# Patient Record
Sex: Female | Born: 1935 | Race: Black or African American | Hispanic: No | Marital: Married | Smoking: Never smoker
Health system: Southern US, Community
[De-identification: ages and names within clinical notes are randomized; demographics above are authoritative.]

## PROBLEM LIST (undated history)

## (undated) DIAGNOSIS — I10 Essential (primary) hypertension: Secondary | ICD-10-CM

## (undated) DIAGNOSIS — K649 Unspecified hemorrhoids: Secondary | ICD-10-CM

## (undated) DIAGNOSIS — E079 Disorder of thyroid, unspecified: Secondary | ICD-10-CM

## (undated) DIAGNOSIS — C801 Malignant (primary) neoplasm, unspecified: Secondary | ICD-10-CM

## (undated) HISTORY — PX: COLON SURGERY: SHX602

## (undated) HISTORY — PX: FRACTURE SURGERY: SHX138

## (undated) HISTORY — DX: Unspecified hemorrhoids: K64.9

---

## 2003-09-15 ENCOUNTER — Other Ambulatory Visit: Payer: Self-pay

## 2003-10-07 ENCOUNTER — Ambulatory Visit: Payer: Self-pay | Admitting: Internal Medicine

## 2003-10-07 ENCOUNTER — Ambulatory Visit: Payer: Self-pay | Admitting: General Surgery

## 2003-10-08 ENCOUNTER — Ambulatory Visit: Payer: Self-pay | Admitting: Internal Medicine

## 2003-11-02 ENCOUNTER — Ambulatory Visit: Payer: Self-pay | Admitting: Internal Medicine

## 2003-12-02 ENCOUNTER — Ambulatory Visit: Payer: Self-pay | Admitting: Internal Medicine

## 2004-01-02 ENCOUNTER — Ambulatory Visit: Payer: Self-pay | Admitting: Internal Medicine

## 2004-02-05 ENCOUNTER — Other Ambulatory Visit: Payer: Self-pay

## 2004-02-05 ENCOUNTER — Inpatient Hospital Stay: Payer: Self-pay | Admitting: Internal Medicine

## 2004-02-06 ENCOUNTER — Other Ambulatory Visit: Payer: Self-pay

## 2004-02-16 ENCOUNTER — Ambulatory Visit: Payer: Self-pay | Admitting: Internal Medicine

## 2004-03-01 ENCOUNTER — Ambulatory Visit: Payer: Self-pay | Admitting: Internal Medicine

## 2004-04-01 ENCOUNTER — Ambulatory Visit: Payer: Self-pay | Admitting: Internal Medicine

## 2004-05-01 ENCOUNTER — Ambulatory Visit: Payer: Self-pay | Admitting: Internal Medicine

## 2004-06-01 ENCOUNTER — Ambulatory Visit: Payer: Self-pay | Admitting: Internal Medicine

## 2004-07-01 ENCOUNTER — Ambulatory Visit: Payer: Self-pay | Admitting: Internal Medicine

## 2004-08-01 ENCOUNTER — Ambulatory Visit: Payer: Self-pay | Admitting: Internal Medicine

## 2004-09-01 ENCOUNTER — Ambulatory Visit: Payer: Self-pay | Admitting: Internal Medicine

## 2004-10-01 ENCOUNTER — Ambulatory Visit: Payer: Self-pay | Admitting: Internal Medicine

## 2004-11-01 ENCOUNTER — Ambulatory Visit: Payer: Self-pay | Admitting: Internal Medicine

## 2004-12-01 ENCOUNTER — Ambulatory Visit: Payer: Self-pay | Admitting: Internal Medicine

## 2005-01-01 ENCOUNTER — Ambulatory Visit: Payer: Self-pay | Admitting: Internal Medicine

## 2005-02-22 ENCOUNTER — Ambulatory Visit: Payer: Self-pay | Admitting: Unknown Physician Specialty

## 2005-02-28 ENCOUNTER — Ambulatory Visit: Payer: Self-pay | Admitting: Internal Medicine

## 2005-03-07 ENCOUNTER — Emergency Department: Payer: Self-pay | Admitting: Emergency Medicine

## 2005-03-28 ENCOUNTER — Ambulatory Visit: Payer: Self-pay | Admitting: Internal Medicine

## 2005-04-01 ENCOUNTER — Ambulatory Visit: Payer: Self-pay | Admitting: Internal Medicine

## 2005-06-26 ENCOUNTER — Ambulatory Visit: Payer: Self-pay | Admitting: Unknown Physician Specialty

## 2005-08-24 ENCOUNTER — Ambulatory Visit: Payer: Self-pay | Admitting: Internal Medicine

## 2005-09-01 ENCOUNTER — Ambulatory Visit: Payer: Self-pay | Admitting: Internal Medicine

## 2005-10-09 ENCOUNTER — Ambulatory Visit: Payer: Self-pay | Admitting: Unknown Physician Specialty

## 2005-10-24 ENCOUNTER — Ambulatory Visit: Payer: Self-pay | Admitting: Internal Medicine

## 2006-01-11 ENCOUNTER — Ambulatory Visit: Payer: Self-pay | Admitting: Internal Medicine

## 2006-02-01 ENCOUNTER — Ambulatory Visit: Payer: Self-pay | Admitting: Internal Medicine

## 2006-07-02 ENCOUNTER — Ambulatory Visit: Payer: Self-pay | Admitting: Internal Medicine

## 2006-07-31 ENCOUNTER — Ambulatory Visit: Payer: Self-pay | Admitting: Internal Medicine

## 2006-08-02 ENCOUNTER — Ambulatory Visit: Payer: Self-pay | Admitting: Internal Medicine

## 2006-09-18 ENCOUNTER — Ambulatory Visit: Payer: Self-pay | Admitting: Unknown Physician Specialty

## 2007-01-13 IMAGING — CR DG CHEST 2V
1 series · 2 of 2 positions shown · non-contrast
Comparison: none

REASON FOR EXAM: colon ca, eval for lung nodule
COMMENTS:

[Series 1: view not recorded · 0.17mm/px · 2 of 2 slices shown]
[im 1/2]
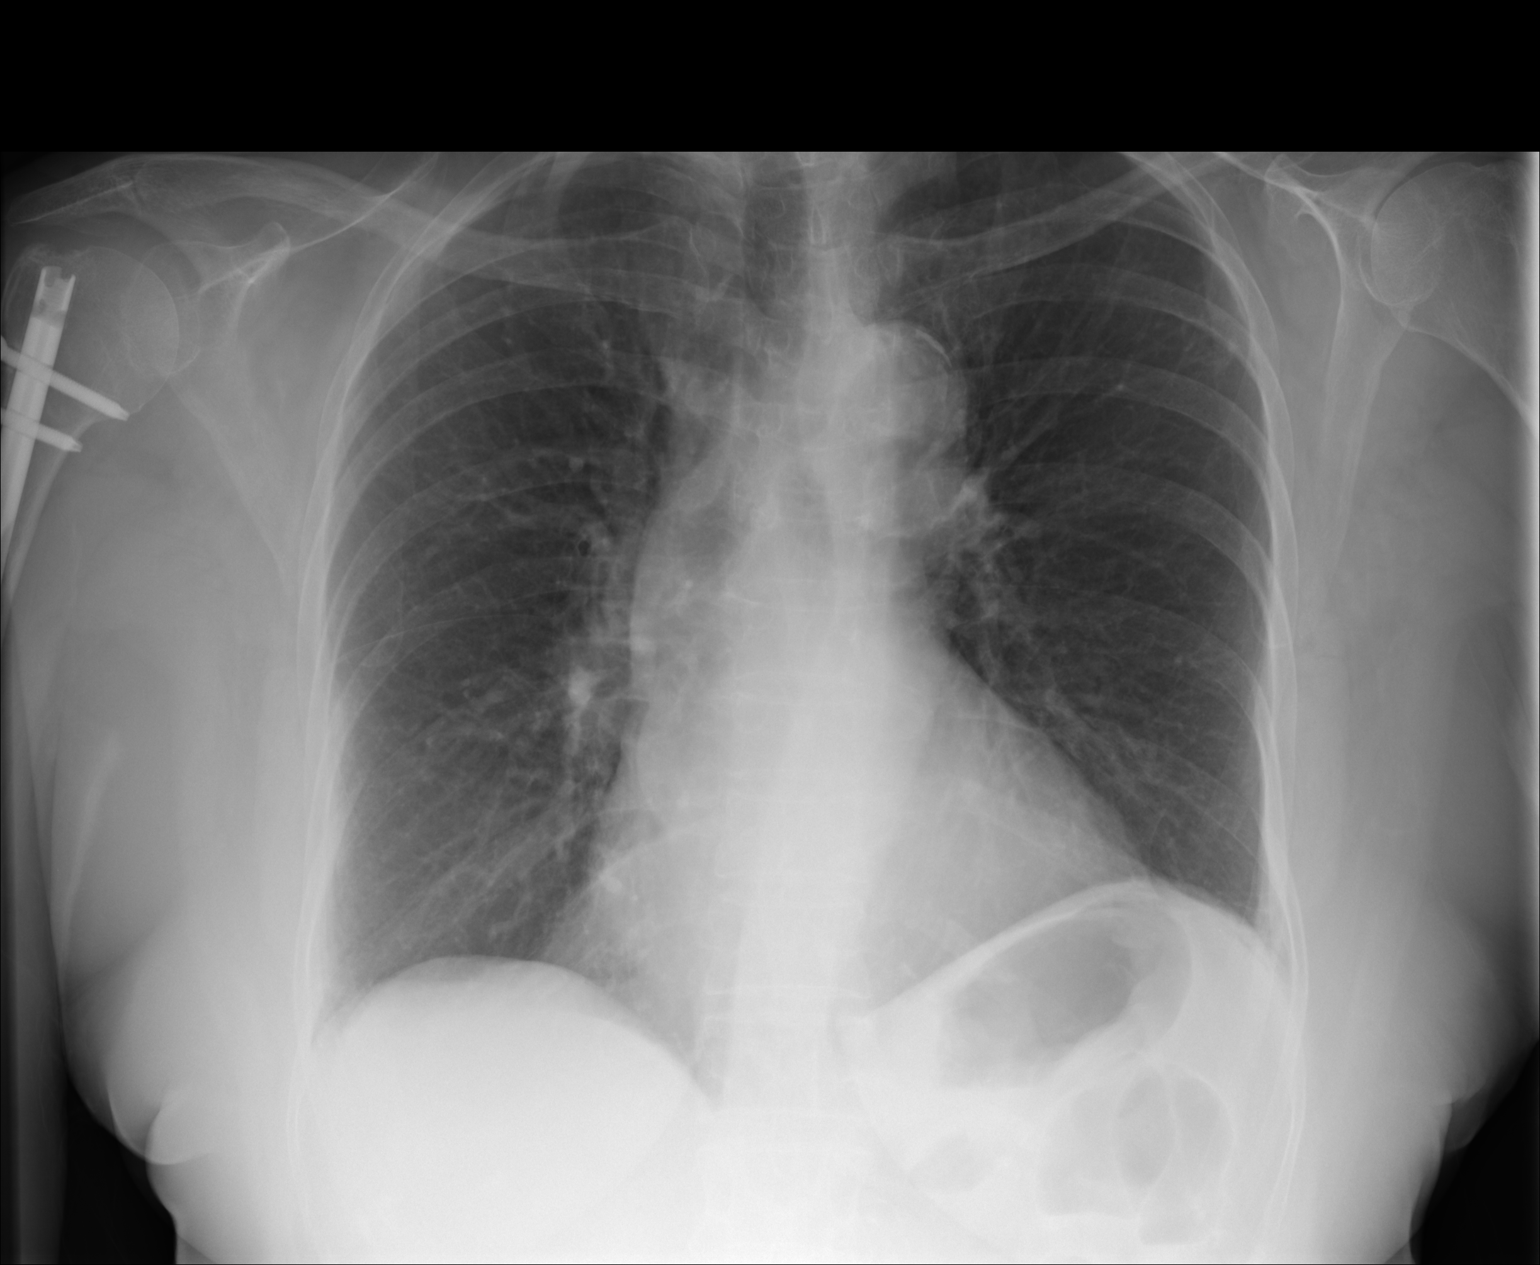
[im 2/2]
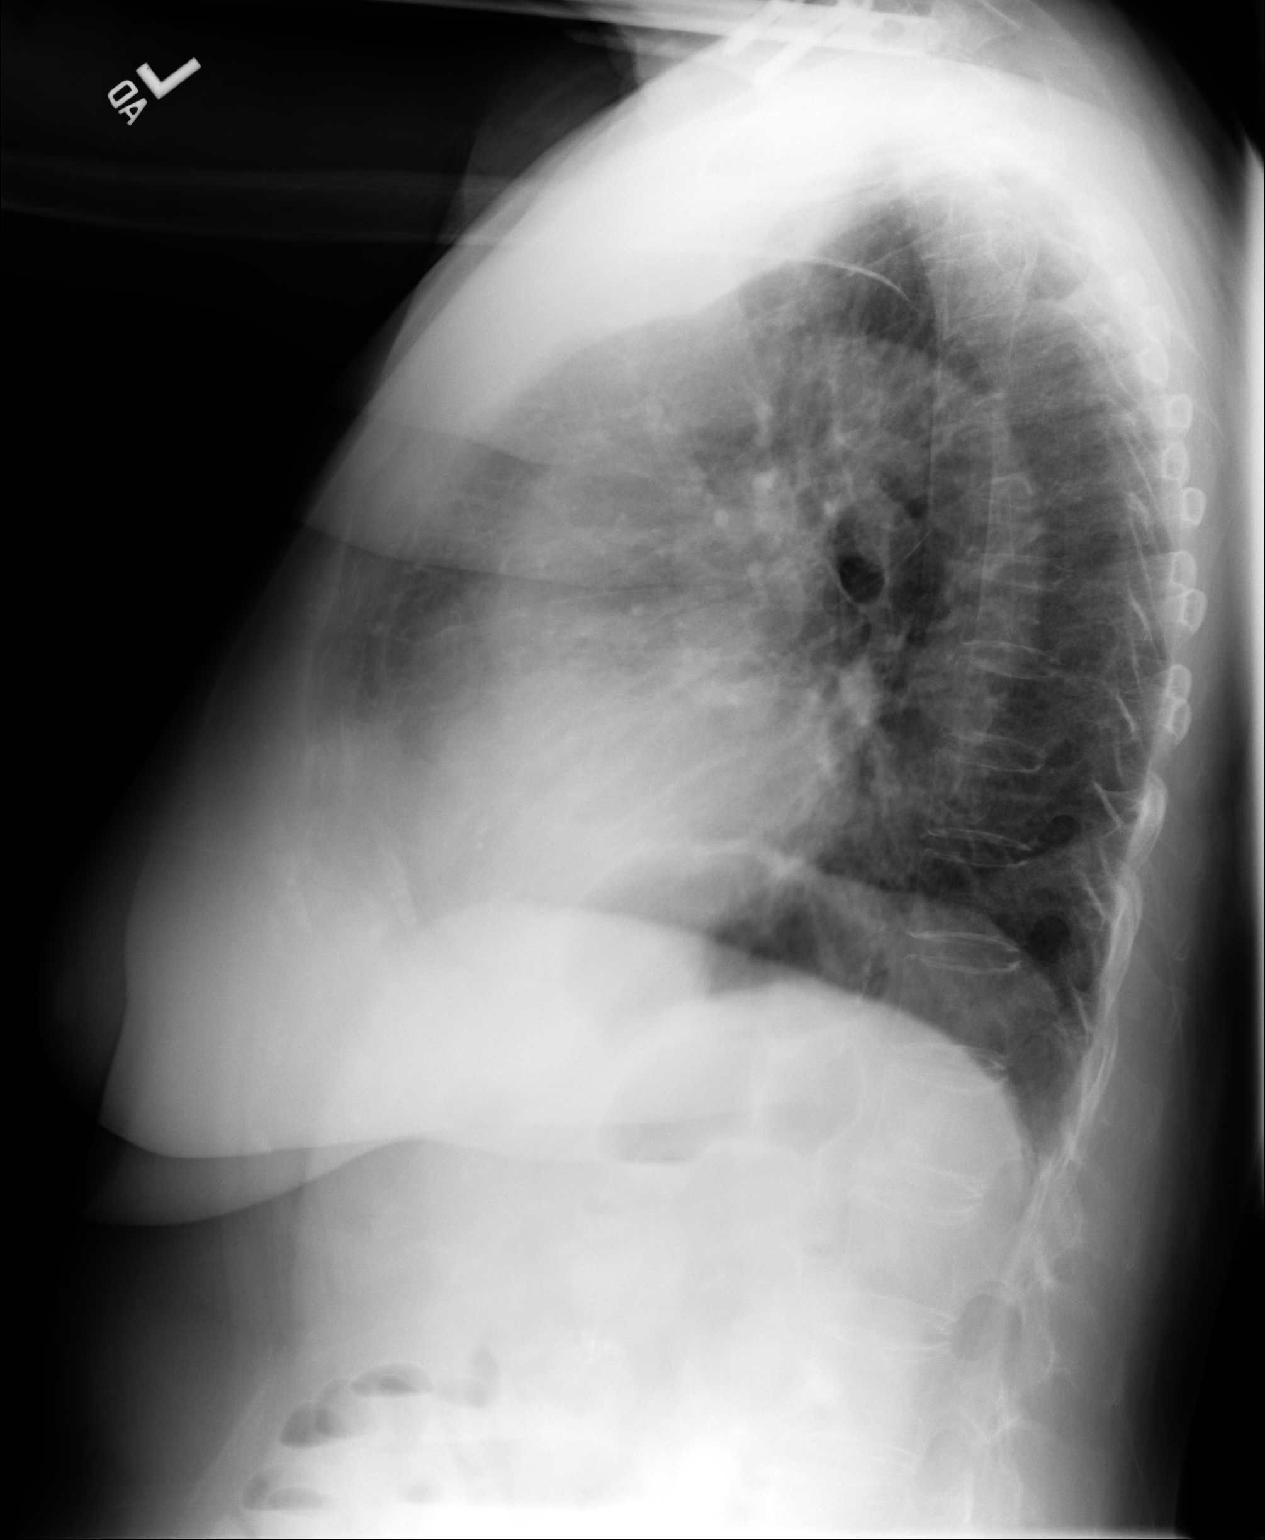

[2 of 2 positions shown; findings below may reference images not displayed]

PROCEDURE:     DXR - DXR CHEST PA (OR AP) AND LATERAL  - October 24, 2005 [DATE]

RESULT:     The lung fields are clear. No pneumonia, pneumothorax or pleural
effusion is seen.  No finding suspicious for metastatic disease is
identified. The previously present Port-A-Cath is no longer seen. The heart
is upper limits to normal in size and is stable as compared to the exam of
12-28-04.  No significant osseous abnormalities are identified.
IMPRESSION: 1)No acute changes are identified.

## 2007-01-13 IMAGING — CT CT ABD-PELV W/ CM
1 of 2 series · 16 of 32 positions shown, 20 images · non-contrast
Comparison: none

REASON FOR EXAM: colon cancer surveillance CT to eval for mets or
recurrence
COMMENTS:

PROCEDURE:     CT  - CT ABDOMEN / PELVIS  W  - October 24, 2005 [DATE]
RESULT:
REASON FOR CONSULTATION: Colon cancer.
TECHNIQUE: Axial images were obtained from the hemidiaphragm to the pubic
symphysis post intravenous and oral administration of contrast material.

[Series 2: abdomen · axial · 0.71mm/px · z∈[-686,-278]mm · 16 of 57 slices shown, 20 images]
[im 3/57  soft-tissue]
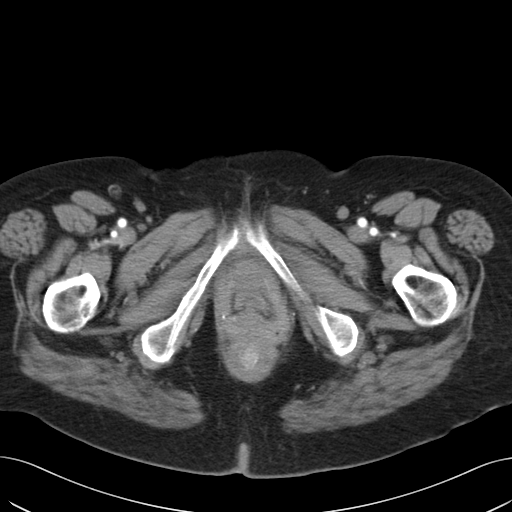
[im 3/57  bone]
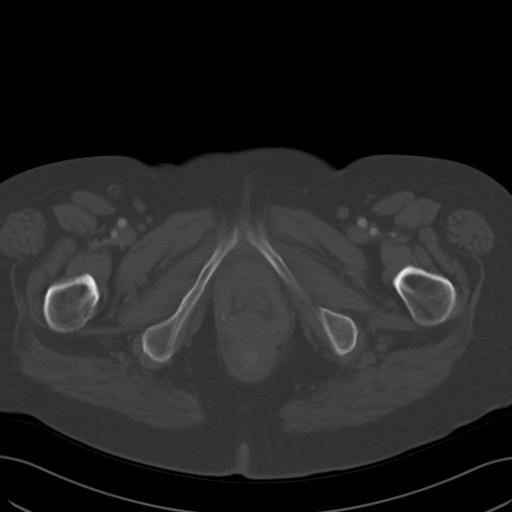
[im 7/57  soft-tissue]
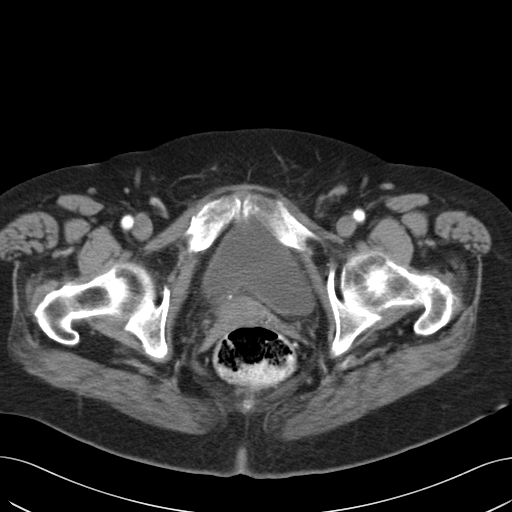
[im 12/57  soft-tissue]
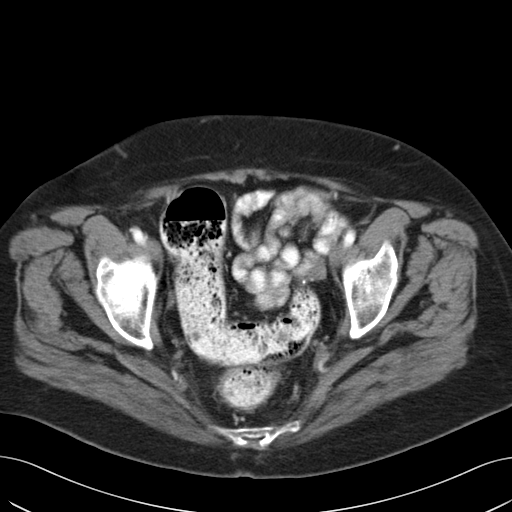
[im 16/57  soft-tissue]
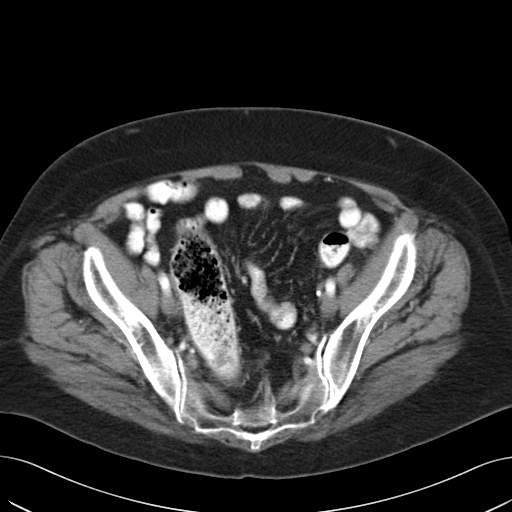
[im 18/57  soft-tissue]
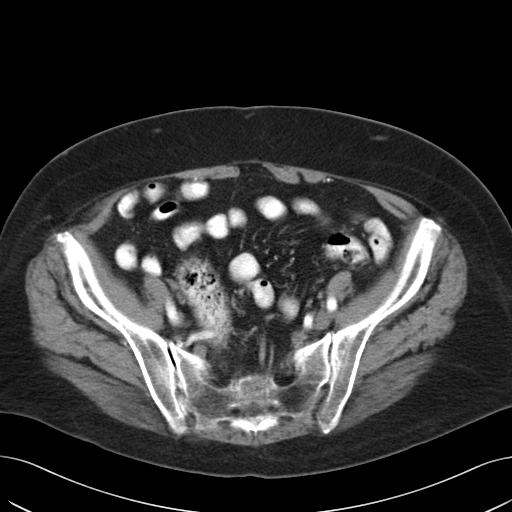
[im 23/57  soft-tissue]
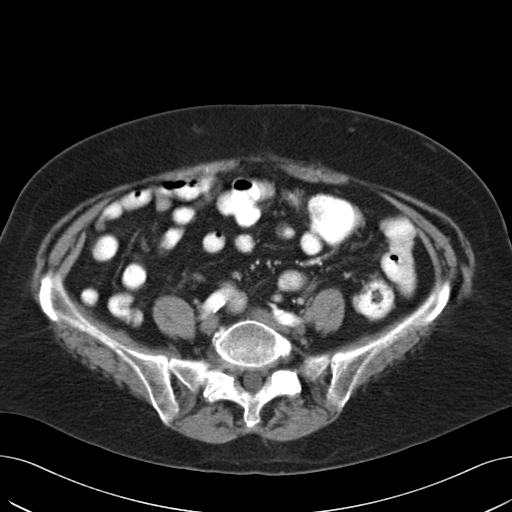
[im 27/57  soft-tissue]
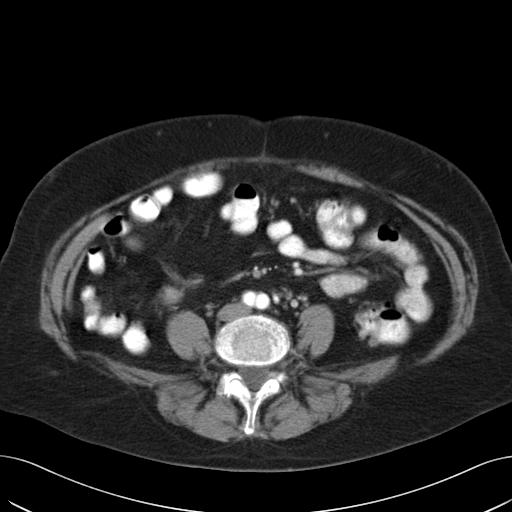
[im 30/57  soft-tissue]
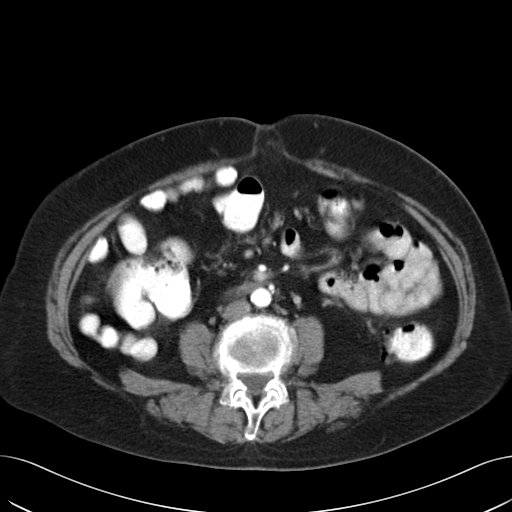
[im 34/57  soft-tissue]
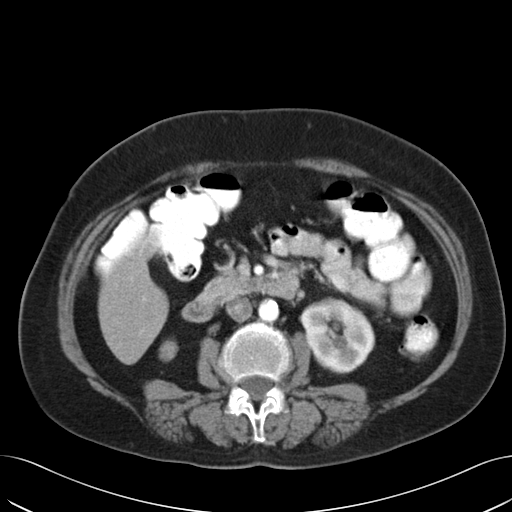
[im 34/57  bone]
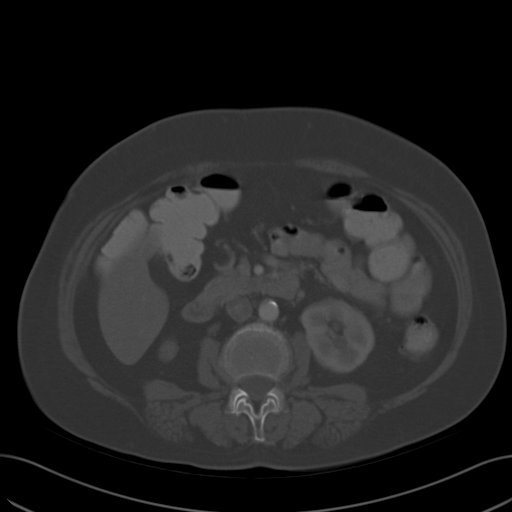
[im 39/57  soft-tissue]
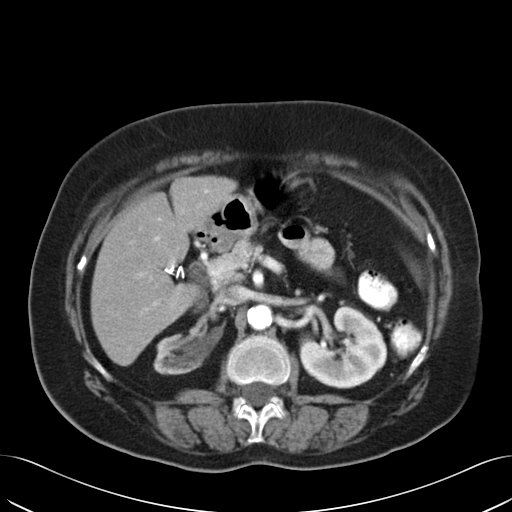
[im 43/57  soft-tissue]
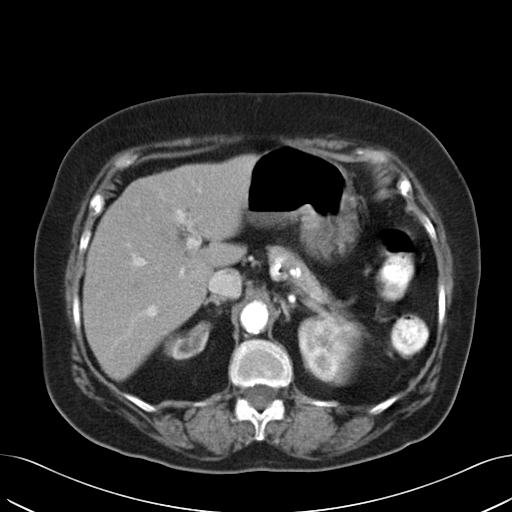
[im 45/57  soft-tissue]
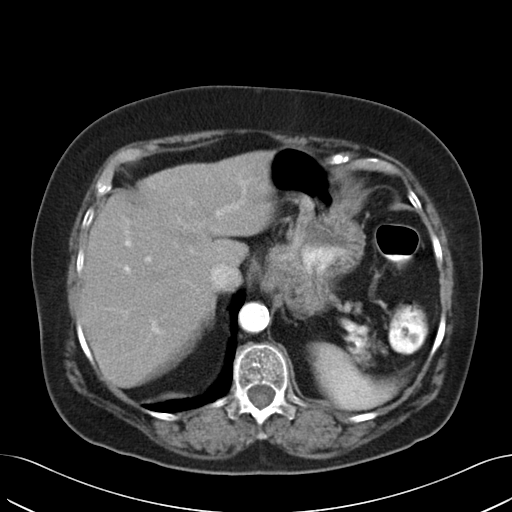
[im 48/57  lung]
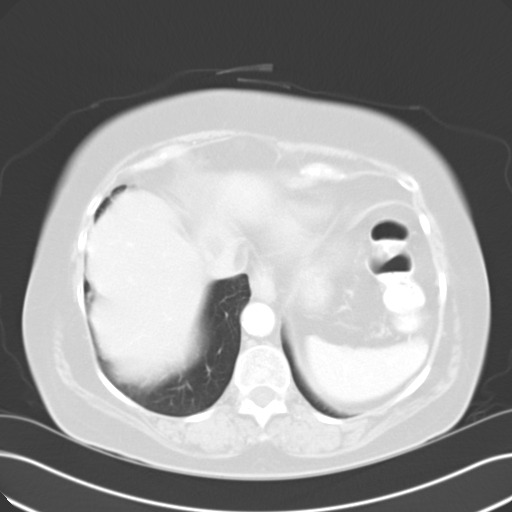
[im 50/57  soft-tissue]
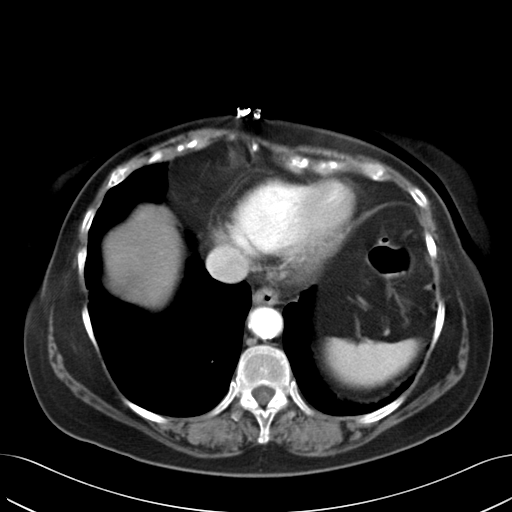
[im 50/57  lung]
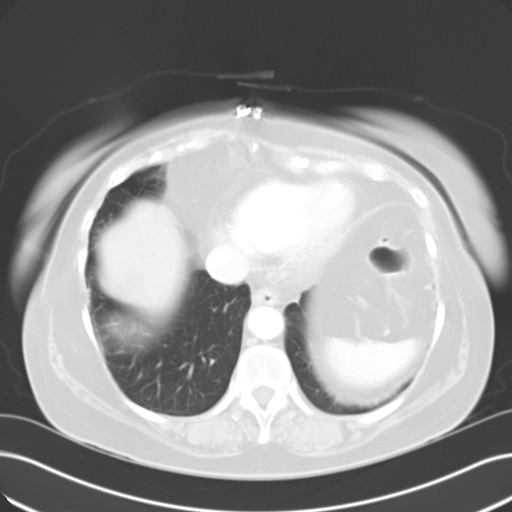
[im 52/57  lung]
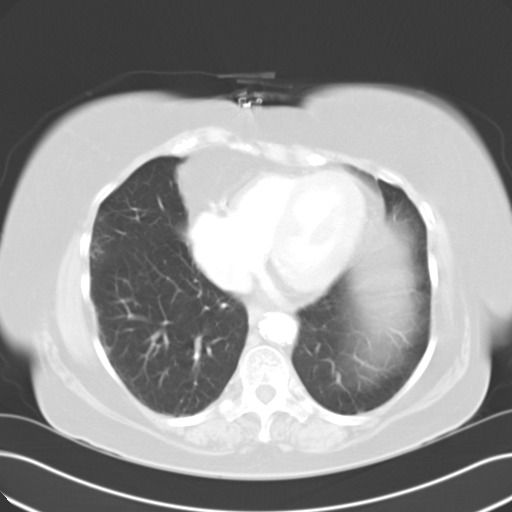
[im 54/57  soft-tissue]
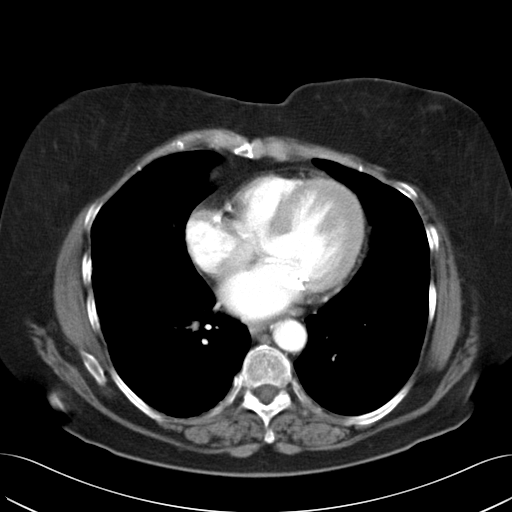
[im 54/57  lung]
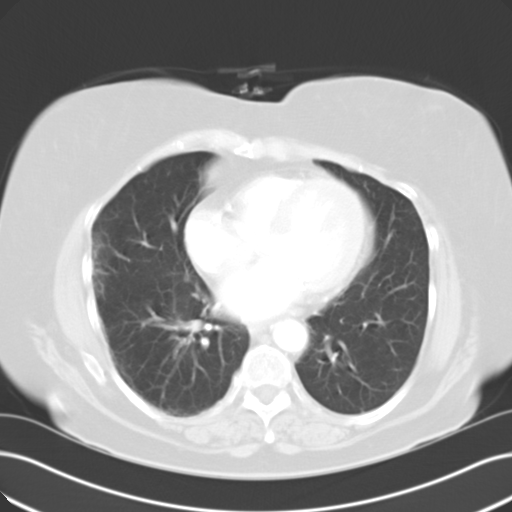

[16 of 32 positions shown; findings below may reference images not displayed]

FINDINGS: The liver and spleen appear intact with no focal masses seen. The
RIGHT kidney is atrophic.  A small exophytic cyst is noted in the LEFT mid
kidney. No hydronephrosis is seen.

No retroperitoneal or pelvic lymphadenopathy is identified. No ascites is
seen in the abdomen or pelvis. The adrenal glands appear intact.
IMPRESSION: 1)No definite evidence of metastases. Incidentally noted is an atrophic
RIGHT kidney.

## 2007-04-25 ENCOUNTER — Emergency Department: Payer: Self-pay | Admitting: Emergency Medicine

## 2007-04-25 ENCOUNTER — Other Ambulatory Visit: Payer: Self-pay

## 2007-09-19 ENCOUNTER — Ambulatory Visit: Payer: Self-pay | Admitting: Unknown Physician Specialty

## 2007-11-11 ENCOUNTER — Ambulatory Visit: Payer: Self-pay | Admitting: Unknown Physician Specialty

## 2009-06-27 ENCOUNTER — Ambulatory Visit: Payer: Self-pay | Admitting: Unknown Physician Specialty

## 2009-06-30 ENCOUNTER — Ambulatory Visit: Payer: Self-pay | Admitting: Unknown Physician Specialty

## 2009-08-18 ENCOUNTER — Inpatient Hospital Stay: Payer: Self-pay | Admitting: Specialist

## 2009-09-08 ENCOUNTER — Ambulatory Visit: Payer: Self-pay | Admitting: Unknown Physician Specialty

## 2009-10-28 ENCOUNTER — Ambulatory Visit: Payer: Self-pay | Admitting: Surgery

## 2010-04-21 ENCOUNTER — Inpatient Hospital Stay: Payer: Self-pay | Admitting: Internal Medicine

## 2010-04-25 ENCOUNTER — Encounter: Payer: Self-pay | Admitting: Internal Medicine

## 2010-05-02 ENCOUNTER — Encounter: Payer: Self-pay | Admitting: Internal Medicine

## 2010-07-12 ENCOUNTER — Ambulatory Visit: Payer: Self-pay | Admitting: Surgery

## 2011-09-25 ENCOUNTER — Ambulatory Visit: Payer: Self-pay | Admitting: Unknown Physician Specialty

## 2012-01-26 LAB — CBC
HCT: 35.6 % (ref 35.0–47.0)
MCH: 29.1 pg (ref 26.0–34.0)
MCHC: 32.7 g/dL (ref 32.0–36.0)
MCV: 89 fL (ref 80–100)
WBC: 8.5 10*3/uL (ref 3.6–11.0)

## 2012-01-26 LAB — COMPREHENSIVE METABOLIC PANEL
Albumin: 3.3 g/dL — ABNORMAL LOW (ref 3.4–5.0)
Alkaline Phosphatase: 84 U/L (ref 50–136)
Calcium, Total: 9.2 mg/dL (ref 8.5–10.1)
Chloride: 106 mmol/L (ref 98–107)
Glucose: 102 mg/dL — ABNORMAL HIGH (ref 65–99)
Potassium: 3.9 mmol/L (ref 3.5–5.1)
SGOT(AST): 18 U/L (ref 15–37)
Sodium: 140 mmol/L (ref 136–145)

## 2012-01-27 ENCOUNTER — Inpatient Hospital Stay: Payer: Self-pay | Admitting: Internal Medicine

## 2012-01-27 LAB — URINALYSIS, COMPLETE
Bilirubin,UR: NEGATIVE
Blood: NEGATIVE
Ketone: NEGATIVE
RBC,UR: 5 /HPF (ref 0–5)
Squamous Epithelial: 2

## 2012-01-27 LAB — PROTIME-INR: INR: 1.9

## 2012-01-27 LAB — TROPONIN I: Troponin-I: 0.04 ng/mL

## 2012-01-28 DIAGNOSIS — I059 Rheumatic mitral valve disease, unspecified: Secondary | ICD-10-CM

## 2012-01-28 LAB — CBC WITH DIFFERENTIAL/PLATELET
Basophil #: 0 10*3/uL (ref 0.0–0.1)
Basophil %: 0.6 %
Eosinophil #: 0.2 10*3/uL (ref 0.0–0.7)
Eosinophil %: 3 %
HCT: 37.2 % (ref 35.0–47.0)
Lymphocyte %: 32.9 %
MCHC: 33 g/dL (ref 32.0–36.0)
Monocyte %: 7.9 %
Neutrophil #: 4.6 10*3/uL (ref 1.4–6.5)
RBC: 4.16 10*6/uL (ref 3.80–5.20)
RDW: 13.8 % (ref 11.5–14.5)
WBC: 8.2 10*3/uL (ref 3.6–11.0)

## 2012-01-28 LAB — BASIC METABOLIC PANEL
Anion Gap: 9 (ref 7–16)
Calcium, Total: 9.3 mg/dL (ref 8.5–10.1)
Chloride: 102 mmol/L (ref 98–107)
Co2: 27 mmol/L (ref 21–32)
Creatinine: 1.28 mg/dL (ref 0.60–1.30)
EGFR (African American): 46 — ABNORMAL LOW
Osmolality: 280 (ref 275–301)
Potassium: 3.8 mmol/L (ref 3.5–5.1)

## 2012-01-28 LAB — PROTIME-INR
INR: 2.5
Prothrombin Time: 26.8 secs — ABNORMAL HIGH (ref 11.5–14.7)

## 2012-01-29 LAB — URINE CULTURE

## 2012-01-29 LAB — PROTIME-INR: Prothrombin Time: 23.8 secs — ABNORMAL HIGH (ref 11.5–14.7)

## 2012-01-30 LAB — PROTIME-INR
INR: 1.9
Prothrombin Time: 22.5 secs — ABNORMAL HIGH (ref 11.5–14.7)

## 2013-01-16 ENCOUNTER — Emergency Department: Payer: Self-pay | Admitting: Emergency Medicine

## 2014-03-01 DIAGNOSIS — I10 Essential (primary) hypertension: Secondary | ICD-10-CM | POA: Insufficient documentation

## 2014-03-01 DIAGNOSIS — E782 Mixed hyperlipidemia: Secondary | ICD-10-CM | POA: Insufficient documentation

## 2014-03-01 DIAGNOSIS — E039 Hypothyroidism, unspecified: Secondary | ICD-10-CM | POA: Insufficient documentation

## 2014-04-23 NOTE — H&P (Signed)
PATIENT NAME:  Haley Brooks, SIMINGTON MR#:  [id number] DATE OF BIRTH:  [birth year]  DATE OF ADMISSION:  01/27/2012  REFERRING PHYSICIAN: Lurline Hare, MD.   PRIMARY CARE PHYSICIAN: Iva Boop, MD.   CHIEF COMPLAINT: Confusion and altered mental status.   HISTORY OF PRESENT ILLNESS: This is a 81 year old female with significant past medical history of hypertension, history of MI in the past, hypothyroidism, COPD, bilateral PE on anticoagulation with INR of 1.9, who presents with complaints of confusion, trouble with memory, poor coordination, having swimmy head as she described it and headache. The patient is with her daughter at bedside. The patient said these complaints started this afternoon. Upon presentation, the patient denies any other neurological deficits. Denies any focal deficits. Any tingling or numbness, or slurred speech or dysarthria. The patient had a CT of the brain done, which did not show any acute finding, but it did show chronic lacunar infarct, which is new from previous CAT scan done in August 2011. The patient was hypertensive in the Emergency Department with initial blood pressure of [id number] systolic. At one point, it went to [id number] systolic blood pressure. The patient had previous admission in 2011 with similar presentation, where she was diagnosed with hypertensive encephalopathy, where her symptoms have resolved after her blood pressure has been controlled. The patient does not recall what her blood pressure is at baseline, but her daughter states she does seem to have poor compliance with diet where her diet does not have any salt restrictions in it as well by reviewing the patient's previous medical records, she appears to be having poor followup with her primary care as well. The patient is being admitted for malignant hypertension with suspected hypertensive encephalopathy.   PAST MEDICAL HISTORY:  1.  History of coronary artery disease.  2.  Hypertension.  3.  Hypothyroidism.  4.   Allergic rhinitis.  5.  Osteoporosis.  6.  Degenerative joint disease.  7.  COPD.  8.  Colon cancer, status post chemotherapy.  9.  History of kidney stones.  10. Bilateral PE on warfarin.  11. Chronic anemia.   ALLERGIES: No known drug allergies.   PAST SURGICAL HISTORY:  1.  Cholecystectomy.  2.  Partial thyroidectomy.  3.  Lithotripsy with stent and right kidney in 2003.  4.  Right knee surgery.  5.  Right humerus fracture with right hip fracture in 2002.  6.  Colectomy in 2005 for colon cancer.  7.  Bilateral cataract surgery.   HOME MEDICATIONS:  1.  Fosamax 70 mg weekly.  2.  Levothyroxine 125 mcg oral daily.  3.  Metoprolol extended release 25 mg oral daily.  4.  Norvasc 5 mg daily.  5.  Benazepril 20 mg oral daily.  6.  Warfarin, curr acid 40 mg oral daily.  ent dose is unclear.   SOCIAL HISTORY: The patient is a nonsmoker. She lives at home with her husband. No history of drinking.   FAMILY HISTORY: Significant for colon cancer, diabetes, lung cancer, degenerative joint disease and osteoporosis.   REVIEW OF SYSTEMS:  CONSTITUTIONAL: The patient appears to be mildly confused, but she denies any fever or fatigue. She complains of generalized weakness and fatigue.  EYES: Denies blurry vision, double vision, pain, inflammation or glaucoma.  ENT: Denies tinnitus, ear pain, hearing loss, epistaxis or discharge.  RESPIRATORY: Denies cough, wheezing, hemoptysis, dyspnea or COPD.  CARDIOVASCULAR: Denies chest pain, edema, palpitations or syncope.  GASTROINTESTINAL: Denies nausea, vomiting, diarrhea, abdominal pain, hematemesis, melena, jaundice or rectal  bleed.  GENITOURINARY: Denies dysuria, hematuria. Denies polyuria, polydipsia, heat or cold intolerance.  HEMATOLOGY: Has history of anemia and history of PE on anticoagulation.  INTEGUMENTARY: Denies acne, rash or skin lesions.  MUSCULOSKELETAL: Denies any cramps, limited activity, arthritis, shoulder pain or neck pain.   NEUROLOGIC: Has complaints of headache, feeling of a swimming head, poor cognition and forgetfulness. Denies any history of  CVA, TIA or seizures in the past.  PSYCHIATRIC: Denies anxiety, insomnia, bipolar disorder, depression or schizophrenia.   PHYSICAL EXAMINATION:  VITAL SIGNS: Temperature 98.8, pulse 72, respiratory rate 18, blood pressure 201/96, saturating 96% on room air.  GENERAL: Elderly female looks comfortable in bed in no apparent distress.  HEENT: Head atraumatic, normocephalic. Pupils equal, reactive to light. Pink conjunctivae. Anicteric sclerae. Moist oral mucosa.  NECK: Supple. No thyromegaly. No JVD.  CHEST: Good air entry bilaterally. No wheezing, rales or rhonchi.  CARDIOVASCULAR: S1, S2 heard. No rubs, murmur, or gallops.  ABDOMEN: Soft, nontender, nondistended. Bowel sounds present.  EXTREMITIES: No edema. No clubbing. No cyanosis.  PSYCHIATRIC: The patient is mildly confused, but pleasant, alert x 3, but appears to be sluggish; it took her a while to remember the date.  NEUROLOGIC: Cranial nerves grossly intact. No nystagmus. No facial droop. No deficits.  Motor 5 out of 5 in all extremities. Sensation symmetrical. Deep tendon reflexes symmetrical and intact. Cerebellar signs symmetrical and no abnormal cerebellar signs. Good finger-to-nose test.   PERTINENT LABORATORIES: Glucose 102, BUN 19, creatinine 1.21, sodium 140, potassium 3.9, chloride 106, CO2 26. Troponin 0.04. White blood cell 8.5, hemoglobin 11.6, hematocrit 35.6, platelets 191. INR 1.9.  CT brain does not show any acute intracranial hemorrhage, showing lacunar infarct in the right centrum semiovale, which is new since previous CT, but has a chronic appearance.   ASSESSMENT AND PLAN:  1.  Altered mental status with increased confusion and trouble with memory, this is most likely related to hypertensive encephalopathy as the patient had previous admission in 2011 with similar presentation where she was  diagnosed with hypertensive encephalopathy where all of her symptoms resolved after that, so we will aim for better blood pressure control. It is unclear how compliant she is with her medication, but she is certainly noncompliant with her diet as per her daughter, so we will have her on heart healthy diet. Will resume her blood pressure medications and will add her on IV hydralazine as needed, but given the fact the patient had some new finding her CT of the brain when compared to 2011, so we will proceed with ordering an MRI of the brain and bilateral carotid Doppler to rule out any acute ischemic event, even though it seems  unlikely. As well, we will give her 324 mg of aspirin. As well we will obtain urinalysis and TSH as they were not done in the ED as they might contribute to her confusion.  2.  Hypertension, uncontrolled. Malignant hypertension. We will resume the patient's home medication and add p.r.n. hydralazine.  3.  Bilateral pulmonary embolus. We will continue with warfarin. We will target INR 2 to 3.  4.  Hypothyroidism. Continue with Synthroid. Check TSH.  5.  Osteoporosis. Can be resumed on Fosamax once discharged home.  6.  Deep vein thrombosis prophylaxis. The patient is on warfarin for pulmonary embolus.  7.  Gastrointestinal prophylaxis. The patient is on proton pump inhibitor.  8.  CODE STATUS: Full code.   TOTAL TIME SPENT ON ADMISSION AND PATIENT CARE: 60 minutes.  ____________________________ Albertine Patricia, MD dse:aw D: 01/27/2012 03:34:33 ET T: 01/27/2012 08:17:48 ET JOB#: [id number]  cc: Albertine Patricia, MD, <Dictator> Soliana Kitko Graciela Husbands MD ELECTRONICALLY SIGNED 01/28/2012 0:31

## 2014-04-23 NOTE — Discharge Summary (Signed)
PATIENT NAME:  Haley Brooks, Haley Brooks MR#:  [id number] DATE OF BIRTH:  [birth year]  DATE OF ADMISSION:  01/27/2012 DATE OF DISCHARGE:  01/30/2012  PRIMARY CARE PHYSICIAN: Dr. Kem Kays.  The patient is being discharged to [facility] for rehab.   PRESENTING COMPLAINT: Confusion.   DISCHARGE DIAGNOSES:  1.  Acute encephalopathy suspected from urinary tract infection and acute cerebrovascular accident.  2.  Acute cerebrovascular accident, acute non-hemorrhagic punctate right cerebellar hemisphere and left basal ganglia infarct.  3.  Chronic anticoagulation with Coumadin due to history of pulmonary embolus in the past.  4.  Chronic confusion suspected from possible dementia. The patient needs workup as outpatient.  5.  Hypothyroidism.  6.  Hypertension.   CONDITION ON DISCHARGE:  1.  Fair. Vitals stable.  2.  A gram sodium diet. 3.  Physical therapy. 4.  Check PT-INR on 02/02/2012 and adjust Coumadin accordingly.   MEDICATIONS AT DISCHARGE:  1.  Aspirin 81 mg daily.  2.  Norvasc 10 mg daily.  3.  Lotensin 40 mg daily.  4.  Coumadin 1.5 mg on Monday, Wednesday, Friday and 3 mg on Tuesday, Thursday, Saturday and Sunday.  5.  Keflex 500 mg p.o. b.i.d. for 5 days.  6.  Tylenol 650 mg p.o. every 4 hours p.r.n.  7.  Zofran 4 mg q. 6 hours p.r.n.  8.  Docusate 100 mg b.i.d.  9.  Synthroid 0.125 mg p.o. daily.  10. Metoprolol 50 mg b.i.d.   CONSULTATION: Physical therapy.  LABS AT DISCHARGE: PT-INR is 22.5 and 1.9. Echo Doppler showed left ventricle function is normal and ejection fraction of 55%. There is impaired LV relaxation, severe concentric left ventricular hypertrophy with speckled appearance of the myocardium suggestive of infiltrative heart disease. Left atrium is moderately dilated. Mitral valve leaflets appear thickened, but open well. There is mild mitral regurgitation. MRI of the brain showed acute non-hemorrhagic punctate to the right cerebellar and left basal ganglia  infarct. Ultrasound carotid Doppler shows no hemodynamically significant stenosis. Urine culture 50,000 Proteus mirabilis. UA positive for UTI. TSH is 1.68. Creatinine 1.2, sodium 138, potassium 3.8. CBC within normal limits.   BRIEF SUMMARY OF HOSPITAL COURSE: The patient is a 81 year old Caucasian female with history of hypertension, hypothyroidism, and history of PE , who comes into the Emergency Room with:  1.  Altered mental status/acute encephalopathy. Her etiology was likely UTI and possible acute cerebrovascular accident. She was initially started on IV Rocephin, but changed to p.o. Keflex. Her urine culture grew Proteus mirabilis. She remained afebrile. White count was stable. Her mentation improved.  2.  Acute CVA, right cerebellar and left basal ganglia non-hemorrhagic punctate lacunar infarct. The patient did not have any obvious neuro deficit. She has some baseline weakness and balance issues. PT recommended rehab. She will go to [facility] today. She is already on Coumadin with therapeutic INR. Her low-dose aspirin was continued.  3.  Hypertensive urgency, now under control. Permissive hypertension was allowed in the setting of stroke. She is on metoprolol, which will be continued.  4.  Hypothyroidism, on levothyroxine.  5.  History of PE, on Coumadin with INR of 1.9 to 2.1 at discharge.  6.  Confusion with possible early dementia. The patient will get formal dementia workup as outpatient. Discharge plan was discussed with the patient's daughter, Mateo Flow. The patient remained a full code.   TIME SPENT: 40 minutes.    ____________________________ Hart Rochester Posey Pronto, MD sap:aw D: 01/30/2012 10:10:36 ET T: 01/30/2012 10:43:44  ET JOB#: [id number]  cc: Zebbie Ace A. Posey Pronto, MD, <Dictator> Lorenza Evangelist, MD Ilda Basset MD ELECTRONICALLY SIGNED 02/08/2012 7:04

## 2014-06-18 DIAGNOSIS — G301 Alzheimer's disease with late onset: Secondary | ICD-10-CM

## 2014-06-18 DIAGNOSIS — N183 Chronic kidney disease, stage 3 unspecified: Secondary | ICD-10-CM | POA: Insufficient documentation

## 2014-06-18 DIAGNOSIS — F0280 Dementia in other diseases classified elsewhere without behavioral disturbance: Secondary | ICD-10-CM | POA: Insufficient documentation

## 2014-08-19 ENCOUNTER — Emergency Department
Admission: EM | Admit: 2014-08-19 | Discharge: 2014-08-19 | Disposition: A | Discharge status: Home / Self Care | Payer: Medicare Other | Attending: Emergency Medicine | Admitting: Emergency Medicine

## 2014-08-19 ENCOUNTER — Emergency Department: Payer: Medicare Other

## 2014-08-19 ENCOUNTER — Encounter: Payer: Self-pay | Admitting: Emergency Medicine

## 2014-08-19 DIAGNOSIS — Y9289 Other specified places as the place of occurrence of the external cause: Secondary | ICD-10-CM | POA: Diagnosis not present

## 2014-08-19 DIAGNOSIS — Y998 Other external cause status: Secondary | ICD-10-CM | POA: Insufficient documentation

## 2014-08-19 DIAGNOSIS — W01198A Fall on same level from slipping, tripping and stumbling with subsequent striking against other object, initial encounter: Secondary | ICD-10-CM | POA: Insufficient documentation

## 2014-08-19 DIAGNOSIS — Y9389 Activity, other specified: Secondary | ICD-10-CM | POA: Insufficient documentation

## 2014-08-19 DIAGNOSIS — S4992XA Unspecified injury of left shoulder and upper arm, initial encounter: Secondary | ICD-10-CM | POA: Diagnosis present

## 2014-08-19 DIAGNOSIS — I10 Essential (primary) hypertension: Secondary | ICD-10-CM | POA: Insufficient documentation

## 2014-08-19 DIAGNOSIS — S43005A Unspecified dislocation of left shoulder joint, initial encounter: Secondary | ICD-10-CM

## 2014-08-19 DIAGNOSIS — S43015A Anterior dislocation of left humerus, initial encounter: Secondary | ICD-10-CM | POA: Diagnosis not present

## 2014-08-19 HISTORY — DX: Essential (primary) hypertension: I10

## 2014-08-19 HISTORY — DX: Disorder of thyroid, unspecified: E07.9

## 2014-08-19 HISTORY — DX: Malignant (primary) neoplasm, unspecified: C80.1

## 2014-08-19 MED ORDER — HYDROCODONE-ACETAMINOPHEN 5-325 MG PO TABS
1.0000 | ORAL_TABLET | Freq: Once | ORAL | Status: AC
  Administered 2014-08-19: 1 via ORAL
  Filled 2014-08-19: qty 1

## 2014-08-19 MED ORDER — DIAZEPAM 2 MG PO TABS
2.0000 mg | ORAL_TABLET | Freq: Once | ORAL | Status: AC
  Administered 2014-08-19: 2 mg via ORAL
  Filled 2014-08-19: qty 1

## 2014-08-19 MED ORDER — TRAMADOL HCL 50 MG PO TABS: 50.0000 mg | ORAL_TABLET | Freq: Three times a day (TID) | ORAL | Status: DC | PRN

## 2014-08-19 MED ORDER — TRAMADOL HCL 50 MG PO TABS
50.0000 mg | ORAL_TABLET | Freq: Once | ORAL | Status: AC
  Administered 2014-08-19: 50 mg via ORAL
  Filled 2014-08-19: qty 1

## 2014-08-19 NOTE — ED Notes (Signed)
Pt presents with left arm pain after losing her balance this am and falling. C/o left arm pain. Cms intact.

## 2014-08-19 NOTE — Discharge Instructions (Signed)
Shoulder Dislocation Your shoulder is made up of three bones: the collar bone (clavicle); the shoulder blade (scapula), which includes the socket (glenoid cavity); and the upper arm bone (humerus). Your shoulder joint is the place where these bones meet. Strong, fibrous tissues hold these bones together (ligaments). Muscles and strong, fibrous tissues that connect the muscles to these bones (tendons) allow your arm to move through this joint. The range of motion of your shoulder joint is more extensive than most of your other joints, and the glenoid cavity is very shallow. That is the reason that your shoulder joint is one of the most unstable joints in your body. It is far more prone to dislocation than your other joints. Shoulder dislocation is when your humerus is forced out of your shoulder joint. CAUSES Shoulder dislocation is caused by a forceful impact on your shoulder. This impact usually is from an injury, such as a sports injury or a fall. SYMPTOMS Symptoms of shoulder dislocation include:  Deformity of your shoulder.  Intense pain.  Inability to move your shoulder joint.  Numbness, weakness, or tingling around your shoulder joint (your neck or down your arm).  Bruising or swelling around your shoulder. DIAGNOSIS In order to diagnose a dislocated shoulder, your caregiver will perform a physical exam. Your caregiver also may have an X-ray exam done to see if you have any broken bones. Magnetic resonance imaging (MRI) is a procedure that sometimes is done to help your caregiver see any damage to the soft tissues around your shoulder, particularly your rotator cuff tendons. Additionally, your caregiver also may have electromyography done to measure the electrical discharges produced in your muscles if you have signs or symptoms of nerve damage. TREATMENT A shoulder dislocation is treated by placing the humerus back in the joint (reduction). Your caregiver does this either manually (closed  reduction), by moving your humerus back into the joint through manipulation, or through surgery (open reduction). When your humerus is back in place, severe pain should improve almost immediately. You also may need to have surgery if you have a weak shoulder joint or ligaments, and you have recurring shoulder dislocations, despite rehabilitation. In rare cases, surgery is necessary if your nerves or blood vessels are damaged during the dislocation. After your reduction, your arm will be placed in a shoulder immobilizer or sling to keep it from moving. Your caregiver will have you wear your shoulder immobilizer or sling for 3 days to 3 weeks, depending on how serious your dislocation is. When your shoulder immobilizer or sling is removed, your caregiver may prescribe physical therapy to help improve the range of motion in your shoulder joint. HOME CARE INSTRUCTIONS  The following measures can help to reduce pain and speed up the healing process:  Rest your injured joint. Do not move it. Avoid activities similar to the one that caused your injury.  Apply ice to your injured joint for the first day or two after your reduction or as directed by your caregiver. Applying ice helps to reduce inflammation and pain.  Put ice in a plastic bag.  Place a towel between your skin and the bag.  Leave the ice on for 15-20 minutes at a time, every 2 hours while you are awake.  Exercise your hand by squeezing a soft ball. This helps to eliminate stiffness and swelling in your hand and wrist.  Take over-the-counter or prescription medicine for pain or discomfort as told by your caregiver. SEEK IMMEDIATE MEDICAL CARE IF:   Your  shoulder immobilizer or sling becomes damaged.  Your pain becomes worse rather than better.  You lose feeling in your arm or hand, or they become white and cold. MAKE SURE YOU:   Understand these instructions.  Will watch your condition.  Will get help right away if you are not  doing well or get worse. Document Released: 09/12/2000 Document Revised: 05/04/2013 Document Reviewed: 10/08/2010 [facility] Patient Information 2015 [place], [place]. This information is not intended to replace advice given to you by your health care provider. Make sure you discuss any questions you have with your health care provider.  Your left shoulder was successfully reduced (put back in place) while in the ED. Continue to wear the shoulder sling as needed. Take the prescription pain medicine as needed. Follow-up with Dr. Mack Guise as directed for shoulder strain and inflammation. Wear the sling or immobilizer while out of bed. Use it until you are cleared by ortho.

## 2014-08-19 NOTE — ED Provider Notes (Signed)
[facility] Emergency Department Provider Note ____________________________________________  Time seen: 1305  I have reviewed the triage vital signs and the nursing notes.  HISTORY  Chief Complaint  Arm Injury  HPI Haley Brooks is a 81 y.o. female who presents to the ED with her adult granddaughter, with complaints of left arm pain and disability, after losing her balance this morning. She describes walking out of the house with someitems in her arm, when she lost her balance, and fell hitting her left arm on the stair rail and stares that she fail. She describes immediate pain from the shoulder down towards the elbow. She is unable to move her arm without difficulty. She denies any head injury or other complaints at this time. She reports the pain at a 7 out of 10 in triage.  Past Medical History  Diagnosis Date  . Hypertension   . Thyroid disease   . Cancer     There are no active problems to display for this patient.   Past Surgical History  Procedure Laterality Date  . Fracture surgery    . Colon surgery      Current Outpatient Rx  Name  Route  Sig  Dispense  Refill  . traMADol (ULTRAM) 50 MG tablet   Oral   Take 1 tablet (50 mg total) by mouth 3 (three) times daily as needed.   15 tablet   0     Allergies Review of patient's allergies indicates no known allergies.  No family history on file.  Social History Social History  Substance Use Topics  . Smoking status: Never Smoker   . Smokeless tobacco: None  . Alcohol Use: No   Review of Systems  Constitutional: Negative for fever. Eyes: Negative for visual changes. ENT: Negative for sore throat. Cardiovascular: Negative for chest pain. Respiratory: Negative for shortness of breath. Gastrointestinal: Negative for abdominal pain, vomiting and diarrhea. Genitourinary: Negative for dysuria. Musculoskeletal: Negative for back pain. Left shoulder pain Skin: Negative for  rash. Neurological: Negative for headaches, focal weakness or numbness. ____________________________________________  PHYSICAL EXAM:  VITAL SIGNS: ED Triage Vitals  Enc Vitals Group     BP 08/19/14 1145 163/65 mmHg     Pulse Rate 08/19/14 1145 72     Resp 08/19/14 1145 18     Temp 08/19/14 1145 97.8 F (36.6 C)     Temp Source 08/19/14 1145 Oral     SpO2 08/19/14 1145 95 %     Weight 08/19/14 1145 160 lb (72.576 kg)     Height 08/19/14 1145 5\' 5"  (1.651 m)     Head Cir --      Peak Flow --      Pain Score 08/19/14 1150 7     Pain Loc --      Pain Edu? --      Excl. in [place]? --    Constitutional: Alert and oriented. Well appearing and in no distress. Eyes: Conjunctivae are normal. PERRL. Normal extraocular movements. ENT   Head: Normocephalic and atraumatic.   Nose: No congestion/rhinnorhea.   Mouth/Throat: Mucous membranes are moist.   Neck: Supple. No thyromegaly. Hematological/Lymphatic/Immunilogical: No cervical lymphadenopathy. Cardiovascular: Normal rate, regular rhythm.  Respiratory: Normal respiratory effort. No wheezes/rales/rhonchi. Gastrointestinal: Soft and nontender. No distention. Musculoskeletal: Nontender with normal range of motion in all extremities, except the left shoulder. The arm is held in a adducted position. Normal elbow flex & extension. Normal grip bilaterally.  Neurologic:  Normal gait without ataxia. Normal speech  and language. No gross focal neurologic deficits are appreciated. Skin:  Skin is warm, dry and intact. No rash noted. Psychiatric: Mood and affect are normal. Patient exhibits appropriate insight and judgment. ____________________________________________   RADIOLOGY Left Humerus IMPRESSION: 1. Glenohumeral dislocation, probably an anterior shoulder Dislocation.  Left Shoulder - post-reduction IMPRESSION: 1. Reduction of left humeral head dislocation. 2. Bony Bankart fracture involving the inferior glenoid.   I,  Emry Barbato, Dannielle Karvonen, personally viewed and evaluated these images (plain radiographs) as part of my medical decision making.  ____________________________________________  PROCEDURES  Ultram 50 mg PO Valium 2 mg PO Vicodin 5-325 mg PO - at discharge  Reduction of dislocation Date/Time: 5:26 PM Performed by: Melvenia Needles Authorized by: Melvenia Needles Consent: Verbal consent obtained. Risks and benefits: risks, benefits and alternatives were discussed Consent given by: patient Required items: required blood products, implants, devices, and special equipment available Time out: Immediately prior to procedure a "time out" was called to verify the correct patient, procedure, equipment, support staff and site/side marked as required. Patient sedated: no Patient tolerance: Patient tolerated the procedure well with no immediate complications. Joint: Left shoulder Reduction technique: manual adducted external rotation ____________________________________________  INITIAL IMPRESSION / ASSESSMENT AND PLAN / ED COURSE  Patient fitted with shoulder sling for comfort and prescription Ultram. Referral information provided for Dr. Mack Guise. Post-reduction care instructions provided.  ____________________________________________  FINAL CLINICAL IMPRESSION(S) / ED DIAGNOSES  Final diagnoses:  Dislocated shoulder, left, initial encounter     Melvenia Needles, PA-C 08/21/14 1236  Lavonia Drafts, MD 08/21/14 1710

## 2014-09-15 ENCOUNTER — Other Ambulatory Visit: Payer: Self-pay | Admitting: Orthopedic Surgery

## 2014-09-15 DIAGNOSIS — S43015A Anterior dislocation of left humerus, initial encounter: Secondary | ICD-10-CM

## 2014-09-16 ENCOUNTER — Ambulatory Visit
Admission: RE | Admit: 2014-09-16 | Discharge: 2014-09-16 | Disposition: A | Discharge status: Home / Self Care | Payer: Medicare Other | Source: Ambulatory Visit | Attending: Orthopedic Surgery | Admitting: Orthopedic Surgery

## 2014-09-16 DIAGNOSIS — S42135A Nondisplaced fracture of coracoid process, left shoulder, initial encounter for closed fracture: Secondary | ICD-10-CM | POA: Diagnosis not present

## 2014-09-16 DIAGNOSIS — X58XXXA Exposure to other specified factors, initial encounter: Secondary | ICD-10-CM | POA: Diagnosis not present

## 2014-09-16 DIAGNOSIS — S43015A Anterior dislocation of left humerus, initial encounter: Secondary | ICD-10-CM

## 2014-12-11 DIAGNOSIS — M8580 Other specified disorders of bone density and structure, unspecified site: Secondary | ICD-10-CM | POA: Insufficient documentation

## 2015-03-10 DIAGNOSIS — D649 Anemia, unspecified: Secondary | ICD-10-CM | POA: Insufficient documentation

## 2015-04-05 DIAGNOSIS — Z85038 Personal history of other malignant neoplasm of large intestine: Secondary | ICD-10-CM | POA: Insufficient documentation

## 2015-08-23 ENCOUNTER — Emergency Department: Payer: Medicare Other

## 2015-08-23 ENCOUNTER — Emergency Department
Admission: EM | Admit: 2015-08-23 | Discharge: 2015-08-23 | Disposition: A | Discharge status: Home / Self Care | Payer: Medicare Other | Attending: Emergency Medicine | Admitting: Emergency Medicine

## 2015-08-23 ENCOUNTER — Encounter: Payer: Self-pay | Admitting: *Deleted

## 2015-08-23 DIAGNOSIS — S39012A Strain of muscle, fascia and tendon of lower back, initial encounter: Secondary | ICD-10-CM

## 2015-08-23 DIAGNOSIS — Z7982 Long term (current) use of aspirin: Secondary | ICD-10-CM | POA: Diagnosis not present

## 2015-08-23 DIAGNOSIS — W208XXA Other cause of strike by thrown, projected or falling object, initial encounter: Secondary | ICD-10-CM | POA: Insufficient documentation

## 2015-08-23 DIAGNOSIS — I10 Essential (primary) hypertension: Secondary | ICD-10-CM | POA: Diagnosis not present

## 2015-08-23 DIAGNOSIS — S92415A Nondisplaced fracture of proximal phalanx of left great toe, initial encounter for closed fracture: Secondary | ICD-10-CM | POA: Diagnosis not present

## 2015-08-23 DIAGNOSIS — Y929 Unspecified place or not applicable: Secondary | ICD-10-CM | POA: Diagnosis not present

## 2015-08-23 DIAGNOSIS — S99922A Unspecified injury of left foot, initial encounter: Secondary | ICD-10-CM | POA: Diagnosis present

## 2015-08-23 DIAGNOSIS — Y999 Unspecified external cause status: Secondary | ICD-10-CM | POA: Diagnosis not present

## 2015-08-23 DIAGNOSIS — Y939 Activity, unspecified: Secondary | ICD-10-CM | POA: Diagnosis not present

## 2015-08-23 DIAGNOSIS — S92425A Nondisplaced fracture of distal phalanx of left great toe, initial encounter for closed fracture: Secondary | ICD-10-CM | POA: Diagnosis not present

## 2015-08-23 DIAGNOSIS — Z859 Personal history of malignant neoplasm, unspecified: Secondary | ICD-10-CM | POA: Diagnosis not present

## 2015-08-23 DIAGNOSIS — S92912A Unspecified fracture of left toe(s), initial encounter for closed fracture: Secondary | ICD-10-CM

## 2015-08-23 DIAGNOSIS — Z791 Long term (current) use of non-steroidal anti-inflammatories (NSAID): Secondary | ICD-10-CM | POA: Insufficient documentation

## 2015-08-23 DIAGNOSIS — Z79899 Other long term (current) drug therapy: Secondary | ICD-10-CM | POA: Insufficient documentation

## 2015-08-23 MED ORDER — TRAMADOL HCL 50 MG PO TABS
50.0000 mg | ORAL_TABLET | Freq: Four times a day (QID) | ORAL | 0 refills | Status: DC | PRN
Start: 2015-08-23 — End: 2017-02-22

## 2015-08-23 NOTE — ED Provider Notes (Signed)
[facility] Provider Note   CSN: [id number] Arrival date & time: 08/23/15  1050     History   Chief Complaint Chief Complaint  Patient presents with  . Fall    HPI Haley Brooks is a 82 y.o. female.  82 year old female presents today complaining of left great and 2nd toe pain as well as low back pain. Pt states last evening she was getting the groceries out of her trunk and dropped a can onto her foot. This caused her to lose her balance and she fell directly onto her lower back. Her 1st and 2nd toes are sore, this morning her back hurt worse than it did last night. Did not hit her head, no loss of consciousness or neck pain.    The history is provided by the patient and a relative.  Fall  This is a new problem. The current episode started 12 to 24 hours ago. The problem occurs constantly. The problem has not changed since onset.Pertinent negatives include no headaches. The symptoms are aggravated by standing and walking. The symptoms are relieved by rest. She has tried a cold compress for the symptoms. The treatment provided mild relief.    Past Medical History:  Diagnosis Date  . Cancer ([place])   . Hypertension   . Thyroid disease     There are no active problems to display for this patient.   Past Surgical History:  Procedure Laterality Date  . COLON SURGERY    . FRACTURE SURGERY      OB History    No data available       Home Medications    Prior to Admission medications   Medication Sig Start Date End Date Taking? Authorizing Provider  amLODipine (NORVASC) 5 MG tablet Take 5 mg by mouth daily.   Yes Historical Provider, MD  apixaban (ELIQUIS) 5 MG TABS tablet Take 5 mg by mouth 2 (two) times daily.   Yes Historical Provider, MD  aspirin 81 MG chewable tablet Chew 81 mg by mouth daily.   Yes Historical Provider, MD  atorvastatin (LIPITOR) 20 MG tablet Take 20 mg by mouth daily.   Yes Historical Provider, MD  benazepril (LOTENSIN) 20 MG  tablet Take 20 mg by mouth daily.   Yes Historical Provider, MD  levothyroxine (SYNTHROID, LEVOTHROID) 137 MCG tablet Take 137 mcg by mouth daily before breakfast.   Yes Historical Provider, MD  metoprolol succinate (TOPROL-XL) 25 MG 24 hr tablet Take 25 mg by mouth daily.   Yes Historical Provider, MD  traMADol (ULTRAM) 50 MG tablet Take 1 tablet (50 mg total) by mouth every 6 (six) hours as needed. 08/23/15   Harvest Dark, PA-C    Family History No family history on file.  Social History Social History  Substance Use Topics  . Smoking status: Never Smoker  . Smokeless tobacco: Never Used  . Alcohol use No     Allergies   Review of patient's allergies indicates no known allergies.   Review of Systems Review of Systems  Musculoskeletal: Positive for arthralgias, back pain and myalgias. Negative for gait problem.  Skin: Positive for wound.  Neurological: Negative for dizziness and headaches.  All other systems reviewed and are negative.    Physical Exam Updated Vital Signs BP (!) 183/78 (BP Location: Left Arm)   Pulse 73   Temp 98.6 F (37 C) (Oral)   Resp 15   Ht 5\' 5"  (1.651 m)   Wt 70.3 kg   SpO2  95%   BMI 25.79 kg/m   Physical Exam  Constitutional: She appears well-developed and well-nourished.  HENT:  Head: Normocephalic and atraumatic.  Neck: Trachea normal and normal range of motion. Neck supple. No spinous process tenderness and no muscular tenderness present. Normal range of motion present.  Musculoskeletal: Normal range of motion. She exhibits tenderness.       Right hip: Normal. She exhibits normal range of motion and no tenderness.       Left hip: Normal. She exhibits normal range of motion and no tenderness.       Lumbar back: She exhibits tenderness and bony tenderness.       Back:       Left foot: There is bony tenderness. There is normal range of motion and normal capillary refill.       Feet:  Skin: Skin is warm and dry. Capillary refill  takes less than 2 seconds.  Psychiatric: She has a normal mood and affect. Her behavior is normal. Judgment and thought content normal.  Nursing note and vitals reviewed.    ED Treatments / Results  Labs (all labs ordered are listed, but only abnormal results are displayed) Labs Reviewed - No data to display  EKG  EKG Interpretation None       Radiology Dg Lumbar Spine 2-3 Views  Result Date: 08/23/2015 CLINICAL DATA:  [place] yesterday.  Back pain. EXAM: LUMBAR SPINE - 2-3 VIEW COMPARISON:  01/16/2013.  04/20/2010. FINDINGS: Old compression fracture at L4 with loss of height of 50%. Likely old compression fracture of T11 with loss of height of 50%. No acute fractures seen. Lower lumbar degenerative disc disease and degenerative facet disease is present. IMPRESSION: Old fracture of L4. Probably old fracture of T11. No acute fracture demonstrated. Chronic lower lumbar degenerative changes. Electronically Signed   By: Nelson Chimes M.D.   On: 08/23/2015 12:31   Dg Foot Complete Left  Result Date: 08/23/2015 CLINICAL DATA:  [place] yesterday with trauma to the great toe. EXAM: LEFT FOOT - COMPLETE 3+ VIEW COMPARISON:  None. FINDINGS: Nondisplaced transverse fracture of the distal phalanx without extension to the articular surface. Longitudinal fracture of the proximal phalanx of the great toe. No other regional injury. Hallux valgus deformity incidentally noted. IMPRESSION: Nondisplaced fractures of the proximal and distal phalanges of the great toe. Electronically Signed   By: Nelson Chimes M.D.   On: 08/23/2015 12:29    Procedures Procedures (including critical care time)  Medications Ordered in ED Medications - No data to display   Initial Impression / Assessment and Plan / ED Course  I have reviewed the triage vital signs and the nursing notes.  Pertinent labs & imaging results that were available during my care of the patient were reviewed by me and considered in my medical decision  making (see chart for details).  Clinical Course    Reviewed lumbar spine xrays and foot xrays. Discussed results with patient and family member. Will send home in cast shoe. Pt declines rx for pain medication. Advised to follow up with Podiatrist on Call, Dr. Vickki Muff, by calling today for an appointment. Follow up with PCP for new or worsening back pain. Pt and family confirmed compression fractures were indeed old.   Final Clinical Impressions(s) / ED Diagnoses   Final diagnoses:  Toe fracture, left, closed, initial encounter  Lumbar strain, initial encounter    New Prescriptions Discharge Medication List as of 08/23/2015  1:03 PM       Terrence Dupont  Tamera Reason, PA-C 08/23/15 1315    Earleen Newport, MD 08/23/15 [phone]

## 2015-08-23 NOTE — ED Triage Notes (Signed)
Pt was getting groceries out of the trunk, a can fell and landed on pt's left great toe, toe is purple and swollen, pt denies hitting head, pt complains of low back pain

## 2015-08-23 NOTE — ED Notes (Signed)
See triage note  States she dropped a can on great toe  Toe is swollen with bruising. Large blister noted

## 2015-08-26 ENCOUNTER — Emergency Department
Admission: EM | Admit: 2015-08-26 | Discharge: 2015-08-26 | Disposition: A | Discharge status: Home / Self Care | Payer: Medicare Other | Attending: Emergency Medicine | Admitting: Emergency Medicine

## 2015-08-26 DIAGNOSIS — W1839XA Other fall on same level, initial encounter: Secondary | ICD-10-CM | POA: Insufficient documentation

## 2015-08-26 DIAGNOSIS — Y929 Unspecified place or not applicable: Secondary | ICD-10-CM | POA: Insufficient documentation

## 2015-08-26 DIAGNOSIS — I10 Essential (primary) hypertension: Secondary | ICD-10-CM | POA: Diagnosis not present

## 2015-08-26 DIAGNOSIS — K625 Hemorrhage of anus and rectum: Secondary | ICD-10-CM

## 2015-08-26 DIAGNOSIS — Z85038 Personal history of other malignant neoplasm of large intestine: Secondary | ICD-10-CM | POA: Insufficient documentation

## 2015-08-26 DIAGNOSIS — Z79899 Other long term (current) drug therapy: Secondary | ICD-10-CM | POA: Diagnosis not present

## 2015-08-26 DIAGNOSIS — Y999 Unspecified external cause status: Secondary | ICD-10-CM | POA: Insufficient documentation

## 2015-08-26 DIAGNOSIS — K649 Unspecified hemorrhoids: Secondary | ICD-10-CM | POA: Insufficient documentation

## 2015-08-26 DIAGNOSIS — Z7982 Long term (current) use of aspirin: Secondary | ICD-10-CM | POA: Insufficient documentation

## 2015-08-26 DIAGNOSIS — S90422A Blister (nonthermal), left great toe, initial encounter: Secondary | ICD-10-CM | POA: Insufficient documentation

## 2015-08-26 DIAGNOSIS — Y939 Activity, unspecified: Secondary | ICD-10-CM | POA: Insufficient documentation

## 2015-08-26 LAB — COMPREHENSIVE METABOLIC PANEL
ALBUMIN: 3.4 g/dL — AB (ref 3.5–5.0)
ALK PHOS: 52 U/L (ref 38–126)
ALT: 17 U/L (ref 14–54)
ANION GAP: 8 (ref 5–15)
AST: 24 U/L (ref 15–41)
BILIRUBIN TOTAL: 0.4 mg/dL (ref 0.3–1.2)
BUN: 20 mg/dL (ref 6–20)
CALCIUM: 9.2 mg/dL (ref 8.9–10.3)
CO2: 27 mmol/L (ref 22–32)
Chloride: 96 mmol/L — ABNORMAL LOW (ref 101–111)
Creatinine, Ser: 0.92 mg/dL (ref 0.44–1.00)
GFR, EST NON AFRICAN AMERICAN: 57 mL/min — AB (ref >60)
GLUCOSE: 118 mg/dL — AB (ref 65–99)
POTASSIUM: 3.6 mmol/L (ref 3.5–5.1)
Sodium: 131 mmol/L — ABNORMAL LOW (ref 135–145)
TOTAL PROTEIN: 7.6 g/dL (ref 6.5–8.1)

## 2015-08-26 LAB — CBC
HEMATOCRIT: 36.3 % (ref 35.0–47.0)
HEMOGLOBIN: 12.1 g/dL (ref 12.0–16.0)
MCH: 29.9 pg (ref 26.0–34.0)
MCHC: 33.4 g/dL (ref 32.0–36.0)
MCV: 89.7 fL (ref 80.0–100.0)
Platelets: 193 10*3/uL (ref 150–440)
RBC: 4.04 MIL/uL (ref 3.80–5.20)
RDW: 14.1 % (ref 11.5–14.5)
WBC: 8.6 10*3/uL (ref 3.6–11.0)

## 2015-08-26 LAB — PROTIME-INR
INR: 1.38
Prothrombin Time: 17.1 seconds — ABNORMAL HIGH (ref 11.4–15.2)

## 2015-08-26 LAB — TYPE AND SCREEN
ABO/RH(D): O POS
ANTIBODY SCREEN: NEGATIVE

## 2015-08-26 MED ORDER — HYDROCORTISONE ACETATE 25 MG RE SUPP: 25.0000 mg | Freq: Two times a day (BID) | RECTAL | 0 refills | Status: DC

## 2015-08-26 MED ORDER — POLYETHYLENE GLYCOL 3350 17 GM/SCOOP PO POWD: 17.0000 g | Freq: Every day | ORAL | 0 refills | Status: DC

## 2015-08-26 MED ORDER — PENTAFLUOROPROP-TETRAFLUOROETH EX AERO
INHALATION_SPRAY | CUTANEOUS | Status: AC
  Administered 2015-08-26: 1
  Filled 2015-08-26: qty 30

## 2015-08-26 MED ORDER — HYDROCORTISONE 2.5 % RE CREA: TOPICAL_CREAM | RECTAL | 1 refills | Status: DC

## 2015-08-26 NOTE — ED Triage Notes (Addendum)
Pt states she was seen here on Monday after a fall on Sunday, states she has been taking tramadol for pain and has not been able to have a BM , states she noticed bleeding from her rectum after straining at 430pm, states it bleed through the toilet tissue so she came to be checked out.. Pt states she is on eliquist.

## 2015-08-26 NOTE — ED Notes (Signed)
MD drained blood blister to left great toe wrapped with gauze.

## 2015-08-26 NOTE — ED Provider Notes (Signed)
[facility] Emergency Department Provider Note  Time seen: 10:00 PM  I have reviewed the triage vital signs and the nursing notes.   HISTORY  Chief Complaint Rectal Bleeding    HPI Haley Brooks is a 82 y.o. female with a past medical history of hypertension, mild dementia, who presents to the emergency department for rectal pain. The patient states earlier this week she had a fall, was seen in the emergency department and prescribed Ultram. She states since then she has been very constipated, strained to have a bowel movement today and since has been having significant rectal pain after noting a small amount of blood in the toilet after temping a bowel movement. The patient is on Eliquis, she was concerned so she came to the emergency department for evaluation. As a secondary complaint the patient also states a very large blood blister to her left great toe which occurred after the fall 1 week ago. Patient describes her rectal pain as 8/10.  Past Medical History:  Diagnosis Date  . Cancer ([place])   . Hypertension   . Thyroid disease     There are no active problems to display for this patient.   Past Surgical History:  Procedure Laterality Date  . COLON SURGERY    . FRACTURE SURGERY      Prior to Admission medications   Medication Sig Start Date End Date Taking? Authorizing Provider  amLODipine (NORVASC) 5 MG tablet Take 5 mg by mouth daily.    Historical Provider, MD  apixaban (ELIQUIS) 5 MG TABS tablet Take 5 mg by mouth 2 (two) times daily.    Historical Provider, MD  aspirin 81 MG chewable tablet Chew 81 mg by mouth daily.    Historical Provider, MD  atorvastatin (LIPITOR) 20 MG tablet Take 20 mg by mouth daily.    Historical Provider, MD  benazepril (LOTENSIN) 20 MG tablet Take 20 mg by mouth daily.    Historical Provider, MD  levothyroxine (SYNTHROID, LEVOTHROID) 137 MCG tablet Take 137 mcg by mouth daily before breakfast.    Historical  Provider, MD  metoprolol succinate (TOPROL-XL) 25 MG 24 hr tablet Take 25 mg by mouth daily.    Historical Provider, MD  traMADol (ULTRAM) 50 MG tablet Take 1 tablet (50 mg total) by mouth every 6 (six) hours as needed. 08/23/15   Harvest Dark, PA-C    No Known Allergies  No family history on file.  Social History Social History  Substance Use Topics  . Smoking status: Never Smoker  . Smokeless tobacco: Never Used  . Alcohol use No    Review of Systems Constitutional: Negative for fever. Cardiovascular: Negative for chest pain. Respiratory: Negative for shortness of breath. Gastrointestinal: Negative for abdominal pain. Positive for constipation. Positive for rectal pain. Positive for bright red blood on toilet paper after straining to have a bowel movement. Genitourinary: Negative for dysuria. Musculoskeletal: Negative for back pain. Large blister to left great toe. Neurological: Negative for headache 10-point ROS otherwise negative.  ____________________________________________   PHYSICAL EXAM:  VITAL SIGNS: ED Triage Vitals [08/26/15 1851]  Enc Vitals Group     BP (!) 177/73     Pulse Rate 63     Resp 18     Temp 97.7 F (36.5 C)     Temp Source Oral     SpO2 97 %     Weight 140 lb (63.5 kg)     Height 5\' 5"  (1.651 m)  Head Circumference      Peak Flow      Pain Score 7     Pain Loc      Pain Edu?      Excl. in [place]?     Constitutional: Alert and oriented. Well appearing and in no distress. Eyes: Normal exam ENT   Head: Normocephalic and atraumatic.   Mouth/Throat: Mucous membranes are moist. Cardiovascular: Normal rate, regular rhythm. No murmur Respiratory: Normal respiratory effort without tachypnea nor retractions. Breath sounds are clear  Gastrointestinal: Soft and nontender. No distention. Rectal exam shows large hemorrhoids, quite painful to palpation area. Normal Color stool Musculoskeletal: Nontender with normal range of motion in all  extremities.  Neurologic:  Normal speech and language. No gross focal neurologic deficits  Skin:  Skin is warm, dry and intact.  Psychiatric: Mood and affect are normal. Speech and behavior are normal.   ____________________________________________   INITIAL IMPRESSION / ASSESSMENT AND PLAN / ED COURSE  Pertinent labs & imaging results that were available during my care of the patient were reviewed by me and considered in my medical decision making (see chart for details).  Labs are largely within normal limits. I highly suspect the patient's bleeding was due to very enlarged and painful hemorrhoids. One of which appears to be possibly thrombosed. Patient's main concern at this point is 8/10 rectal pain. I discussed the patient with Dr. Adonis Huguenin of Gen. surgery who will be down to see the patient.  Dr. Adonis Huguenin of Gen. surgery has seen the patient, does not believe any of the hemorrhoids to be thrombosed. He discussed the various options with the patient, the patient has elected to be discharged home, multiple follow-up in the clinic next week to discuss further hemorrhoidal treatment. In the meantime general surgery recommends discharge with steroid cream or suppository MiraLAX for constipation, and will follow-up next week.  ____________________________________________   FINAL CLINICAL IMPRESSION(S) / ED DIAGNOSES  Hemorrhoidal bleeding Painful hemorrhoids Rectal pain    Harvest Dark, MD 08/26/15 2244

## 2015-08-26 NOTE — Consult Note (Signed)
Patient ID: Haley Brooks, female   DOB: Aug 07, 1931, 82 y.o.   MRN: [id number]  CC: Rectal Bleeding  HPI Haley Brooks is a 82 y.o. female who presents to emergency department for evaluation of rectal pain and bleeding. Surgery consult was requested by Dr.Paduchowski for evaluation of symptomatic hemorrhoidal disease. Patient has a known medical history of hypertension, mild dementia, colon cancer, stroke. She is on blood thinners secondary to the stroke. Patient had a fall last week and was started on pain medication which has made it difficult for her to have a bowel movement. Today she was straining for a bowel movement and developed significant rectal pain and noticed some blood on the toilet paper which prompted her to seek care in the emergency department. Patient also has complaints of pain to her toe from dropping something on it. Upon further questioning about her bowel habits prior to the fall she had daily bowel movements without any assistance. Since starting the pain medications oh week ago she's only had 1 bowel movement. She currently denies any fevers, chills, nausea, vomiting, chest pain, shortness of breath.   HPI  Past Medical History:  Diagnosis Date  . Cancer [facility]): Colon    . Hypertension   . Thyroid disease   Senile dementia of the Alzheimer's type Stroke Coronary artery disease status post MI in 2006   COPD Pulmonary emboli  Past Surgical History:  Procedure Laterality Date  . COLON SURGERY, hemicolectomy for colon cancer     . FRACTURE SURGERY    Partial thyroidectomy Right knee surgery Cholecystectomy   Family history: Mother with colon cancer Father with diabetes Sr. with cancer of unknown type Brother with cancer of unknown type Distant relatives with diabetes Distant relatives with osteoporosis   Social History Social History  Substance Use Topics  . Smoking status: Never Smoker  . Smokeless tobacco: Never Used  . Alcohol use No    No  Known Allergies  No current facility-administered medications for this encounter.    Current Outpatient Prescriptions  Medication Sig Dispense Refill  . amLODipine (NORVASC) 5 MG tablet Take 5 mg by mouth daily.    Marland Kitchen apixaban (ELIQUIS) 5 MG TABS tablet Take 5 mg by mouth 2 (two) times daily.    Marland Kitchen aspirin 81 MG chewable tablet Chew 81 mg by mouth daily.    Marland Kitchen atorvastatin (LIPITOR) 20 MG tablet Take 20 mg by mouth daily.    . benazepril (LOTENSIN) 20 MG tablet Take 20 mg by mouth daily.    Marland Kitchen levothyroxine (SYNTHROID, LEVOTHROID) 137 MCG tablet Take 137 mcg by mouth daily before breakfast.    . metoprolol succinate (TOPROL-XL) 25 MG 24 hr tablet Take 25 mg by mouth daily.    . traMADol (ULTRAM) 50 MG tablet Take 1 tablet (50 mg total) by mouth every 6 (six) hours as needed. 15 tablet 0     Review of SysteMulti-oint review of systems was asked and was negative except for the findings documented in the history of present illness Physical Exam Blood pressure (!) 190/71, pulse (!) 55, temperature 97.7 F (36.5 C), temperature source Oral, resp. rate 20, height 5' 5"  (1.651 m), weight 63.5 kg (140 lb), SpO2 98 %. CONSTITUTIONAL: Resting in bed in no acute distress. EYES: Pupils are equal, round, and reactive to light, Sclera are non-icteric. EARS, NOSE, MOUTH AND THROAT: The oropharynx is clear. The oral mucosa is pink and moist. Hearing is intact to voice. LYMPH NODES:  Lymph nodes in the  neck are normal. RESPIRATORY:  Lungs are clear. There is normal respiratory effort, with equal breath sounds bilaterally, and without pathologic use of accessory muscles. CARDIOVASCULAR: Heart is regular without murmurs, gallops, or rubs. GI: The abdomen is soft, nontender, and nondistended. There are no palpable masses. There is no hepatosplenomegaly. There are normal bowel sounds in all quadrants. GU: exam shows circumferential hemorrhoidal disease. Minimally tender to palpation. No evidence of thrombosed  external hemorrhoids. No active bleeding on rectal exam. Very lax sphincter tone. .   MUSCULOSKELETAL: Normal muscle strength and tone. No cyanosis or edema.   SKIN: Turgor is good and there are no pathologic skin lesions or ulcers. NEUROLOGIC: Motor and sensation is grossly normal. Cranial nerves are grossly intact. PSYCH:  Oriented to person, place and time. Affect is normal.  Data Reviewed Upon review of her labs shows no evidence of anemia with an H&H 12.1 over 36.3. Mildly decreased renal function with an EGFR 57. Mild hyponatremia of 131, mild hypochloremia of 96. Prolonged prothrombin time is 17.1, normal INR of 1.38. I have personally reviewed the patient's imaging, laboratory findings and medical records.    Assessment    Symptomatic hemorrhoids insetting of constipation    Plan    82 year old female with symptomatic hemorrhoids secondary to constipation from pain medication usage. No current thrombosed external hemorrhoid requiring decompression. Patient does have 3 column hemorrhoidal disease present on exam today. Had long conversation with patient and her family about the importance of maintaining regularity while on pain medications. Discussed the usage of fiber and MiraLAX. Also discussed the usage of steroid suppositories to assist with hemorrhoidal swelling. Patient voiced understanding. I will have the patient be contacted by our clinic on Monday to assist with outpatient follow-up.     Time spent with the patient was 40 minutes, with more than 50% of the time spent in face-to-face education, counseling and care coordination.     Clayburn Pert, MD FACS General Surgeon 08/26/2015, 10:33 PM

## 2015-08-26 NOTE — ED Notes (Signed)
MD at the bedside for rectal exam. Hemoccult +. Large hemorrhoids.

## 2015-08-29 ENCOUNTER — Telehealth: Payer: Self-pay

## 2015-08-29 NOTE — Telephone Encounter (Signed)
Spoke with patient per Dr. Reginal Lutes request as patient was seen in Emergency Room on 08/26/15. Patient is feeling no better despite using Miralax and Fiber. Patient does not remember being given prescription for steroid suppositories and have not been using this to help with hemorrhoid pain or bleeding.  Placed on schedule to see Dr. Dahlia Byes.

## 2015-08-30 ENCOUNTER — Other Ambulatory Visit: Payer: Self-pay

## 2015-08-31 ENCOUNTER — Ambulatory Visit (INDEPENDENT_AMBULATORY_CARE_PROVIDER_SITE_OTHER): Payer: Medicare Other | Admitting: Surgery

## 2015-08-31 ENCOUNTER — Encounter: Payer: Self-pay | Admitting: Surgery

## 2015-08-31 VITALS — BP 173/76 | HR 58 | Temp 98.0°F

## 2015-08-31 DIAGNOSIS — K649 Unspecified hemorrhoids: Secondary | ICD-10-CM

## 2015-08-31 NOTE — Patient Instructions (Signed)
Please try to put the shower handle on your rectum area with warm water twice a day to help with the swelling of your hemorrhoids. Continue to apply the Anusol cream on your rectum. Please try to keep your rectum clean at all times. We will call Dr. Ward Chatters to let him know that you need a refill on your Tramadol for your hip pain. Please give [place] a call if you have any questions or concerns.

## 2015-08-31 NOTE — Progress Notes (Signed)
Outpatient Surgical Follow Up  08/31/2015  Haley Brooks is an 82 y.o. female.   Chief Complaint  Patient presents with  . Follow-up    ED Hemorrhoids    HPI: A 82 year old very nice female with a history of symptomatic internal hemorrhoids. She was seen by my partner Dr. Adonis Huguenin and a half personally reviewed his notes. Over the last week or so her symptoms have somewhat improved. Fortunately she had a recent fall that is causing her back pain and left toe pain. She went to the emergency room and workup revealed evidence of a toe fracture. She unfortunately has not been able to do sitz baths. She intermittently places the cream and could not afford the suppositories. She reports some intermittent sharp mild to moderate anorectal pain exacerbated when she has a bowel movement. Symptoms seems to be getting a little bit better over the last week or so  Past Medical History:  Diagnosis Date  . Cancer ([place])   . Hemorrhoid   . Hypertension   . Thyroid disease     Past Surgical History:  Procedure Laterality Date  . COLON SURGERY    . FRACTURE SURGERY      No family history on file.  Social History:  reports that she has never smoked. She has never used smokeless tobacco. She reports that she does not drink alcohol. Her drug history is not on file.  Allergies: No Known Allergies  Medications reviewed.  ROS Full review of system was performed and is otherwise negative other than what is stated in the history of present illness   BP (!) 173/76 (BP Location: Right Arm, Patient Position: Sitting, Cuff Size: Large)   Pulse (!) 58   Temp 98 F (36.7 C) (Oral)   Physical Exam Elderly female in no acute distress his stress test Abd: soft, NT, ND, Rectal: grade III and Iv internal hemorrhoids, no necrosis or thrombosis, no incarceration, no masses Ext: left foot immobilizer with a toe wrapped. There are well perfused and no evidence of edema.    No results found for this or  any previous visit (from the past 48 hour(s)). No results found.  Assessment/Plan: Symptomatic internal hemorrhoids no need for surgical intervention at this time. Continue medical management. I emphasized the importance of hygiene, sitz baths and fiber diet. She will continue the steroid cream. We'll be happy to follow her up in 4 weeks.  Clayburn Pert, MD FACS General Surgeon  08/31/2015,9:03 AM

## 2015-09-14 ENCOUNTER — Other Ambulatory Visit: Payer: Self-pay | Admitting: Internal Medicine

## 2015-09-14 ENCOUNTER — Ambulatory Visit
Admission: RE | Admit: 2015-09-14 | Discharge: 2015-09-14 | Disposition: A | Discharge status: Home / Self Care | Payer: Medicare Other | Source: Ambulatory Visit | Attending: Internal Medicine | Admitting: Internal Medicine

## 2015-09-14 DIAGNOSIS — R1032 Left lower quadrant pain: Secondary | ICD-10-CM | POA: Diagnosis present

## 2015-09-14 DIAGNOSIS — Z9049 Acquired absence of other specified parts of digestive tract: Secondary | ICD-10-CM | POA: Diagnosis not present

## 2015-09-14 DIAGNOSIS — K5790 Diverticulosis of intestine, part unspecified, without perforation or abscess without bleeding: Secondary | ICD-10-CM | POA: Diagnosis not present

## 2015-09-14 DIAGNOSIS — N261 Atrophy of kidney (terminal): Secondary | ICD-10-CM | POA: Diagnosis not present

## 2015-09-14 DIAGNOSIS — I2510 Atherosclerotic heart disease of native coronary artery without angina pectoris: Secondary | ICD-10-CM | POA: Insufficient documentation

## 2015-09-14 DIAGNOSIS — I700 Atherosclerosis of aorta: Secondary | ICD-10-CM | POA: Diagnosis not present

## 2015-09-14 DIAGNOSIS — R1012 Left upper quadrant pain: Secondary | ICD-10-CM

## 2015-09-14 DIAGNOSIS — Z8781 Personal history of (healed) traumatic fracture: Secondary | ICD-10-CM | POA: Diagnosis not present

## 2015-09-14 MED ORDER — IOPAMIDOL (ISOVUE-300) INJECTION 61%
100.0000 mL | Freq: Once | INTRAVENOUS | Status: AC | PRN
  Administered 2015-09-14: 100 mL via INTRAVENOUS

## 2015-09-15 ENCOUNTER — Other Ambulatory Visit: Payer: Self-pay | Admitting: Internal Medicine

## 2015-09-15 DIAGNOSIS — R1012 Left upper quadrant pain: Secondary | ICD-10-CM

## 2015-09-20 ENCOUNTER — Ambulatory Visit
Admission: RE | Admit: 2015-09-20 | Discharge: 2015-09-20 | Disposition: A | Discharge status: Home / Self Care | Payer: Medicare Other | Source: Ambulatory Visit | Attending: Internal Medicine | Admitting: Internal Medicine

## 2015-09-20 DIAGNOSIS — N281 Cyst of kidney, acquired: Secondary | ICD-10-CM | POA: Insufficient documentation

## 2015-09-20 DIAGNOSIS — N261 Atrophy of kidney (terminal): Secondary | ICD-10-CM | POA: Diagnosis not present

## 2015-09-20 DIAGNOSIS — R1012 Left upper quadrant pain: Secondary | ICD-10-CM

## 2015-09-20 DIAGNOSIS — Z9049 Acquired absence of other specified parts of digestive tract: Secondary | ICD-10-CM | POA: Diagnosis not present

## 2015-09-20 DIAGNOSIS — N2889 Other specified disorders of kidney and ureter: Secondary | ICD-10-CM | POA: Diagnosis not present

## 2015-10-05 ENCOUNTER — Ambulatory Visit: Payer: Self-pay | Admitting: Surgery

## 2016-04-18 ENCOUNTER — Ambulatory Visit (INDEPENDENT_AMBULATORY_CARE_PROVIDER_SITE_OTHER): Payer: Medicare Other | Admitting: Obstetrics and Gynecology

## 2016-04-18 ENCOUNTER — Ambulatory Visit: Payer: Self-pay | Admitting: Obstetrics & Gynecology

## 2016-04-18 ENCOUNTER — Encounter: Payer: Self-pay | Admitting: Obstetrics and Gynecology

## 2016-04-18 VITALS — BP 134/64 | HR 58 | Wt 150.0 lb

## 2016-04-18 DIAGNOSIS — N819 Female genital prolapse, unspecified: Secondary | ICD-10-CM | POA: Diagnosis not present

## 2016-04-18 MED ORDER — ESTROGENS, CONJUGATED 0.625 MG/GM VA CREA: 1.0000 | TOPICAL_CREAM | VAGINAL | 3 refills | Status: DC

## 2016-04-18 NOTE — Progress Notes (Signed)
Obstetrics & Gynecology Office Visit   Chief Complaint:  Chief Complaint  Patient presents with  . Pessary Check  . Vaginal Discharge    History of Present Illness: 83 year old female presenting for follow up and pessary change.  She is wearing a size 5 ring with support.  She was last seen July 2017 and reports no problems since her last visit.  No vaginal bleeding or discomfort noted by the patient.   Review of Systems: Review of Systems  Genitourinary: Negative for dysuria, flank pain, frequency, hematuria and urgency.    Past Medical History:  Past Medical History:  Diagnosis Date  . Cancer [facility])    colon cancer   . Hemorrhoid   . Hypertension   . Thyroid disease     Past Surgical History:  Past Surgical History:  Procedure Laterality Date  . COLON SURGERY    . FRACTURE SURGERY      Gynecologic History: No LMP recorded. Patient is postmenopausal.  Obstetric History: G3P3  Family History:  History reviewed. No pertinent family history.  Social History:  Social History   Social History  . Marital status: Married    Spouse name: N/A  . Number of children: N/A  . Years of education: N/A   Occupational History  . Not on file.   Social History Main Topics  . Smoking status: Never Smoker  . Smokeless tobacco: Never Used  . Alcohol use No  . Drug use: Unknown  . Sexual activity: No   Other Topics Concern  . Not on file   Social History Narrative  . No narrative on file    Allergies:  No Known Allergies  Medications: Prior to Admission medications   Medication Sig Start Date End Date Taking? Authorizing Provider  alendronate (FOSAMAX) 70 MG tablet Take 1 tablet by mouth 1 day or 1 dose. 03/25/15   Historical Provider, MD  amLODipine (NORVASC) 5 MG tablet Take 5 mg by mouth daily.    Historical Provider, MD  apixaban (ELIQUIS) 5 MG TABS tablet Take 5 mg by mouth 2 (two) times daily.    Historical Provider, MD  aspirin 81 MG chewable tablet  Chew 81 mg by mouth daily.    Historical Provider, MD  atorvastatin (LIPITOR) 20 MG tablet Take 20 mg by mouth daily.    Historical Provider, MD  benazepril (LOTENSIN) 20 MG tablet Take 20 mg by mouth daily.    Historical Provider, MD  conjugated estrogens (PREMARIN) vaginal cream Place 1 Applicatorful vaginally 2 (two) times a week. 04/19/16   [name], MD  levothyroxine (SYNTHROID, LEVOTHROID) 137 MCG tablet Take 137 mcg by mouth daily before breakfast.    Historical Provider, MD  metoprolol succinate (TOPROL-XL) 25 MG 24 hr tablet Take 25 mg by mouth daily.    Historical Provider, MD  polyethylene glycol powder (GLYCOLAX/MIRALAX) powder Take 17 g by mouth daily. 08/26/15   Harvest Dark, MD  traMADol (ULTRAM) 50 MG tablet Take 1 tablet (50 mg total) by mouth every 6 (six) hours as needed. 08/23/15   Harvest Dark, PA-C    Physical Exam Vitals:  Vitals:   04/18/16 1356  BP: 134/64  Pulse: (!) 58   No LMP recorded. Patient is postmenopausal.  General: NAD HEENT: normocephalic, anicteric Pulmonary: No increased work of breathing Genitourinary:  External: Normal external female genitalia.  Normal urethral meatus, normal Bartholin's and Skene's glands.    Vagina: Normal vaginal mucosa, moderate vaginal atrophy, prominent cystocele.  No evidence of erosions  Cervix: Grossly normal in appearance, no bleeding  Uterus: Non-enlarged, mobile, normal contour.  No CMT  Adnexa: ovaries non-enlarged, no adnexal masses  Rectal: deferred  Lymphatic: no evidence of inguinal lymphadenopathy Extremities: no edema, erythema, or tenderness Neurologic: Grossly intact Psychiatric: mood appropriate, affect full  Female chaperone present for pelvic and breast  portions of the physical exam  Assessment: 83 y.o. G3P3 presenting for follow up pessary change  Plan: Problem List Items Addressed This Visit    None    Visit Diagnoses    Female genital prolapse, unspecified type    -  Primary        Ring with support #5, last seen 07/2015, removed refill on premarin

## 2016-07-18 ENCOUNTER — Encounter: Payer: Self-pay | Admitting: Obstetrics and Gynecology

## 2016-07-18 ENCOUNTER — Ambulatory Visit (INDEPENDENT_AMBULATORY_CARE_PROVIDER_SITE_OTHER): Payer: Medicare Other | Admitting: Obstetrics and Gynecology

## 2016-07-18 VITALS — BP 122/62 | HR 59 | Ht 65.0 in | Wt 148.0 lb

## 2016-07-18 DIAGNOSIS — Z4689 Encounter for fitting and adjustment of other specified devices: Secondary | ICD-10-CM

## 2016-07-18 NOTE — Progress Notes (Signed)
Obstetrics & Gynecology Office Visit   Chief Complaint:  Chief Complaint  Patient presents with  . Gynecologic Exam    Pessary check    History of Present Illness: Haley Brooks is a 83 y.o. female who is seen today for a follow-up appointment for a  pessary check. She is using a size 5 ring with support pessary which she has used for quite some time now.  She denies vaginal bleeding.  She denies vaginal discharge.  She is voiding and defecating without difficulty.  The pessary has not caused her any discomfort in the intervening 3 months since her last visit.  Review of Systems: Review of systems otherwise negative unless noted in HPI  Past Medical History:  Past Medical History:  Diagnosis Date  . Cancer [facility])    colon cancer   . Hemorrhoid   . Hypertension   . Thyroid disease     Past Surgical History:  Past Surgical History:  Procedure Laterality Date  . COLON SURGERY    . FRACTURE SURGERY      Gynecologic History: No LMP recorded. Patient is postmenopausal.  Obstetric History: G3P3  Family History:  History reviewed. No pertinent family history.  Social History:  Social History   Social History  . Marital status: Married    Spouse name: N/A  . Number of children: N/A  . Years of education: N/A   Occupational History  . Not on file.   Social History Main Topics  . Smoking status: Never Smoker  . Smokeless tobacco: Never Used  . Alcohol use No  . Drug use: Unknown  . Sexual activity: No   Other Topics Concern  . Not on file   Social History Narrative  . No narrative on file    Allergies:  No Known Allergies  Medications: Prior to Admission medications   Medication Sig Start Date End Date Taking? Authorizing Provider  alendronate (FOSAMAX) 70 MG tablet Take 1 tablet by mouth 1 day or 1 dose. 03/25/15  Yes [provider]  amLODipine (NORVASC) 5 MG tablet Take 5 mg by mouth daily.   Yes [provider]  apixaban  (ELIQUIS) 5 MG TABS tablet Take 5 mg by mouth 2 (two) times daily.   Yes [provider]  aspirin 81 MG chewable tablet Chew 81 mg by mouth daily.   Yes [provider]  atorvastatin (LIPITOR) 20 MG tablet Take 20 mg by mouth daily.   Yes [provider]  benazepril (LOTENSIN) 20 MG tablet Take 20 mg by mouth daily.   Yes [provider]  conjugated estrogens (PREMARIN) vaginal cream Place 1 Applicatorful vaginally 2 (two) times a week. 04/19/16  Yes [name], MD  levothyroxine (SYNTHROID, LEVOTHROID) 137 MCG tablet Take 137 mcg by mouth daily before breakfast.   Yes [provider]  metoprolol succinate (TOPROL-XL) 25 MG 24 hr tablet Take 25 mg by mouth daily.   Yes [provider]  traMADol (ULTRAM) 50 MG tablet Take 1 tablet (50 mg total) by mouth every 6 (six) hours as needed. 08/23/15  Yes Harvest Dark, PA-C    Physical Exam Vitals:  Vitals:   07/18/16 0812  BP: 122/62  Pulse: (!) 59   No LMP recorded. Patient is postmenopausal.  General: NAD HEENT: normocephalic, anicteric Pulmonary: No increased work of breathing Abdomen: NABS, soft, non-tender, non-distended.  Umbilicus without lesions.  No hepatomegaly, splenomegaly or masses palpable. No evidence of hernia  Genitourinary:  External:  Normal external female genitalia with age appropriate atrophy.  Normal urethral meatus, normal Bartholin's and Skene's glands.    Vagina: Vagina: Normal vaginal mucosa, moderate vaginal atrophy, prominent cystocele.  No evidence of erosions  Cervix: Grossly normal in appearance, no bleeding  Uterus: Non-enlarged, mobile, normal contour.  No CMT  Adnexa: ovaries non-enlarged, no adnexal masses  Rectal: deferred  Lymphatic: no evidence of inguinal lymphadenopathy Extremities: no edema, erythema, or tenderness Neurologic: Grossly intact Psychiatric: mood appropriate, affect full  Female chaperone present for pelvic and breast   portions of the physical exam  Assessment: 83 y.o. G3P3 pessary cleaning  Plan: Problem List Items Addressed This Visit    None      - Pessary cleaned and replaced - continue twice weekly premarin cream - RTC in 3 months for next pessary check - A total of 15 minutes were spent in face-to-face contact with the patient during this encounter with over half of that time devoted to counseling and coordination of care.

## 2016-07-18 NOTE — Patient Instructions (Signed)
Pelvic Organ Prolapse Pelvic organ prolapse is the stretching, bulging, or dropping of pelvic organs into an abnormal position. It happens when the muscles and tissues that surround and support pelvic structures are stretched or weak. Pelvic organ prolapse can involve:  Vagina (vaginal prolapse).  Uterus (uterine prolapse).  Bladder (cystocele).  Rectum (rectocele).  Intestines (enterocele).  When organs other than the vagina are involved, they often bulge into the vagina or protrude from the vagina, depending on how severe the prolapse is. What are the causes? Causes of this condition include:  Pregnancy, labor, and childbirth.  Long-lasting (chronic) cough.  Chronic constipation.  Obesity.  Past pelvic surgery.  Aging. During and after menopause, a decreased production of the hormone estrogen can weaken pelvic ligaments and muscles.  Consistently lifting more than 50 lb (23 kg).  Buildup of fluid in the abdomen due to certain diseases and other conditions.  What are the signs or symptoms? Symptoms of this condition include:  Loss of bladder control when you cough, sneeze, strain, and exercise (stress incontinence). This may be worse immediately following childbirth, and it may gradually improve over time.  Feeling pressure in your pelvis or vagina. This pressure may increase when you cough or when you are having a bowel movement.  A bulge that protrudes from the opening of your vagina or against your vaginal wall. If your uterus protrudes through the opening of your vagina and rubs against your clothing, you may also experience soreness, ulcers, infection, pain, and bleeding.  Increased effort to have a bowel movement or urinate.  Pain in your low back.  Pain, discomfort, or disinterest in sexual intercourse.  Repeated bladder infections (urinary tract infections).  Difficulty inserting or inability to insert a tampon or applicator.  In some people, this  condition does not cause any symptoms. How is this diagnosed? Your health care provider may perform an internal and external vaginal and rectal exam. During the exam, you may be asked to cough and strain while you are lying down, sitting, and standing up. Your health care provider will determine if other tests are required, such as bladder function tests. How is this treated? In most cases, this condition needs to be treated only if it produces symptoms. No treatment is guaranteed to correct the prolapse or relieve the symptoms completely. Treatment may include:  Lifestyle changes, such as: ? Avoiding drinking beverages that contain caffeine. ? Increasing your intake of high-fiber foods. This can help to decrease constipation and straining during bowel movements. ? Emptying your bladder at scheduled times (bladder training therapy). This can help to reduce or avoid urinary incontinence. ? Losing weight if you are overweight or obese.  Estrogen. Estrogen may help mild prolapse by increasing the strength and tone of pelvic floor muscles.  Kegel exercises. These may help mild cases of prolapse by strengthening and tightening the muscles of the pelvic floor.  Pessary insertion. A pessary is a soft, flexible device that is placed into your vagina by your health care provider to help support the vaginal walls and keep pelvic organs in place.  Surgery. This is often the only form of treatment for severe prolapse. Different types of surgeries are available.  Follow these instructions at home:  Wear a sanitary pad or absorbent product if you have urinary incontinence.  Avoid heavy lifting and straining with exercise and work. Do not hold your breath when you perform mild to moderate lifting and exercise activities. Limit your activities as directed by your health care   provider.  Take medicines only as directed by your health care provider.  Perform Kegel exercises as directed by your health care  provider.  If you have a pessary, take care of it as directed by your health care provider. Contact a health care provider if:  Your symptoms interfere with your daily activities or sex life.  You need medicine to help with the discomfort.  You notice bleeding from the vagina that is not related to your period.  You have a fever.  You have pain or bleeding when you urinate.  You have bleeding when you have a bowel movement.  You lose urine when you have sex.  You have chronic constipation.  You have a pessary that falls out.  You have vaginal discharge that has a bad smell.  You have low abdominal pain or cramping that is unusual for you. This information is not intended to replace advice given to you by your health care provider. Make sure you discuss any questions you have with your health care provider. Document Released: 07/15/2013 Document Revised: 05/26/2015 Document Reviewed: 03/02/2013 Elsevier Interactive Patient Education  2018 [employer]  

## 2016-10-22 ENCOUNTER — Encounter: Payer: Self-pay | Admitting: Obstetrics and Gynecology

## 2016-10-22 ENCOUNTER — Ambulatory Visit (INDEPENDENT_AMBULATORY_CARE_PROVIDER_SITE_OTHER): Payer: Medicare Other | Admitting: Obstetrics and Gynecology

## 2016-10-22 VITALS — BP 104/64 | Ht 65.0 in | Wt 146.0 lb

## 2016-10-22 DIAGNOSIS — Z4689 Encounter for fitting and adjustment of other specified devices: Secondary | ICD-10-CM | POA: Diagnosis not present

## 2016-10-22 NOTE — Progress Notes (Signed)
Obstetrics & Gynecology Office Visit   Chief Complaint:  Chief Complaint  Patient presents with  . Follow-up    Pessary Check    History of Present Illness: Haley Brooks is a 83 y.o. female who is seen today for a follow-up appointment for a  pessary check. She is using a size 5 ting with support pessary for anterior posterior prolapse  She Denies vaginal bleeding.  She Denies vaginal discharge.  She is voiding and defecating without difficulty.    Review of Systems: Review of Systems  Gastrointestinal: Negative for abdominal pain and constipation.  Genitourinary: Negative for dysuria, frequency, hematuria and urgency.    Past Medical History:  Past Medical History:  Diagnosis Date  . Cancer [facility])    colon cancer   . Hemorrhoid   . Hypertension   . Thyroid disease     Past Surgical History:  Past Surgical History:  Procedure Laterality Date  . COLON SURGERY    . FRACTURE SURGERY      Gynecologic History: No LMP recorded. Patient is postmenopausal.  Obstetric History: G3P3  Family History:  History reviewed. No pertinent family history.  Social History:  Social History   Social History  . Marital status: Married    Spouse name: N/A  . Number of children: N/A  . Years of education: N/A   Occupational History  . Not on file.   Social History Main Topics  . Smoking status: Never Smoker  . Smokeless tobacco: Never Used  . Alcohol use No  . Drug use: No  . Sexual activity: No   Other Topics Concern  . Not on file   Social History Narrative  . No narrative on file    Allergies:  No Known Allergies  Medications: Prior to Admission medications   Medication Sig Start Date End Date Taking? Authorizing Provider  alendronate (FOSAMAX) 70 MG tablet Take 1 tablet by mouth 1 day or 1 dose. 03/25/15  Yes [provider]  amLODipine (NORVASC) 5 MG tablet Take 5 mg by mouth daily.   Yes [provider]  apixaban (ELIQUIS) 5 MG  TABS tablet Take 5 mg by mouth 2 (two) times daily.   Yes [provider]  aspirin 81 MG chewable tablet Chew 81 mg by mouth daily.   Yes [provider]  atorvastatin (LIPITOR) 20 MG tablet Take 20 mg by mouth daily.   Yes [provider]  benazepril (LOTENSIN) 20 MG tablet Take 20 mg by mouth daily.   Yes [provider]  conjugated estrogens (PREMARIN) vaginal cream Place 1 Applicatorful vaginally 2 (two) times a week. 04/19/16  Yes [name], MD  levothyroxine (SYNTHROID, LEVOTHROID) 137 MCG tablet Take 137 mcg by mouth daily before breakfast.   Yes [provider]  metoprolol succinate (TOPROL-XL) 25 MG 24 hr tablet Take 25 mg by mouth daily.   Yes [provider]  traMADol (ULTRAM) 50 MG tablet Take 1 tablet (50 mg total) by mouth every 6 (six) hours as needed. 08/23/15  Yes Harvest Dark, PA-C    Physical Exam Vitals:  Vitals:   10/22/16 0807  BP: 104/64   No LMP recorded. Patient is postmenopausal.  General: NAD HEENT: normocephalic, anicteric Pulmonary: No increased work of breathing Genitourinary:  External: Normal external female genitalia.  Normal urethral meatus, normal Bartholin's and Skene's glands.    Vagina: Normal vaginal mucosa, no evidence of erosions or bleeding, anterior cystocele mild rectocele  Cervix: Grossly normal  in appearance, no bleeding  Uterus: Non-enlarged, mobile, normal contour.  No CMT Extremities: no edema, erythema, or tenderness Neurologic: Grossly intact Psychiatric: mood appropriate, affect full  Female chaperone present for pelvic and breast  portions of the physical exam  Assessment: 83 y.o. G3P3 presenting for pessary follow up  Plan: Problem List Items Addressed This Visit    None    Visit Diagnoses    Pessary maintenance    -  Primary     - Pessary cleaned and replaced.  Not having any pessary related issues.   - Follow up in 3 months for repeat pessary cleaning

## 2016-12-24 ENCOUNTER — Emergency Department: Payer: Medicare Other

## 2016-12-24 ENCOUNTER — Other Ambulatory Visit: Payer: Self-pay

## 2016-12-24 ENCOUNTER — Observation Stay
Admission: EM | Admit: 2016-12-24 | Discharge: 2016-12-25 | Disposition: A | Discharge status: Home / Self Care | Payer: Medicare Other | Attending: Internal Medicine | Admitting: Internal Medicine

## 2016-12-24 ENCOUNTER — Encounter: Payer: Self-pay | Admitting: Emergency Medicine

## 2016-12-24 DIAGNOSIS — G301 Alzheimer's disease with late onset: Secondary | ICD-10-CM | POA: Insufficient documentation

## 2016-12-24 DIAGNOSIS — G9341 Metabolic encephalopathy: Secondary | ICD-10-CM | POA: Diagnosis not present

## 2016-12-24 DIAGNOSIS — R410 Disorientation, unspecified: Secondary | ICD-10-CM | POA: Diagnosis present

## 2016-12-24 DIAGNOSIS — E782 Mixed hyperlipidemia: Secondary | ICD-10-CM | POA: Diagnosis not present

## 2016-12-24 DIAGNOSIS — D631 Anemia in chronic kidney disease: Secondary | ICD-10-CM | POA: Diagnosis not present

## 2016-12-24 DIAGNOSIS — F0280 Dementia in other diseases classified elsewhere without behavioral disturbance: Secondary | ICD-10-CM | POA: Diagnosis not present

## 2016-12-24 DIAGNOSIS — E860 Dehydration: Secondary | ICD-10-CM | POA: Diagnosis present

## 2016-12-24 DIAGNOSIS — Z8673 Personal history of transient ischemic attack (TIA), and cerebral infarction without residual deficits: Secondary | ICD-10-CM | POA: Diagnosis not present

## 2016-12-24 DIAGNOSIS — Z79899 Other long term (current) drug therapy: Secondary | ICD-10-CM | POA: Insufficient documentation

## 2016-12-24 DIAGNOSIS — M8580 Other specified disorders of bone density and structure, unspecified site: Secondary | ICD-10-CM | POA: Diagnosis not present

## 2016-12-24 DIAGNOSIS — Z85038 Personal history of other malignant neoplasm of large intestine: Secondary | ICD-10-CM | POA: Diagnosis not present

## 2016-12-24 DIAGNOSIS — N179 Acute kidney failure, unspecified: Secondary | ICD-10-CM | POA: Diagnosis not present

## 2016-12-24 DIAGNOSIS — N390 Urinary tract infection, site not specified: Secondary | ICD-10-CM | POA: Diagnosis present

## 2016-12-24 DIAGNOSIS — N183 Chronic kidney disease, stage 3 (moderate): Secondary | ICD-10-CM | POA: Diagnosis not present

## 2016-12-24 DIAGNOSIS — I129 Hypertensive chronic kidney disease with stage 1 through stage 4 chronic kidney disease, or unspecified chronic kidney disease: Secondary | ICD-10-CM | POA: Diagnosis not present

## 2016-12-24 DIAGNOSIS — Z7982 Long term (current) use of aspirin: Secondary | ICD-10-CM | POA: Diagnosis not present

## 2016-12-24 DIAGNOSIS — B9620 Unspecified Escherichia coli [E. coli] as the cause of diseases classified elsewhere: Secondary | ICD-10-CM | POA: Diagnosis not present

## 2016-12-24 DIAGNOSIS — E039 Hypothyroidism, unspecified: Secondary | ICD-10-CM | POA: Insufficient documentation

## 2016-12-24 DIAGNOSIS — Z7901 Long term (current) use of anticoagulants: Secondary | ICD-10-CM | POA: Diagnosis not present

## 2016-12-24 LAB — CBC
HCT: 32.9 % — ABNORMAL LOW (ref 35.0–47.0)
Hemoglobin: 11 g/dL — ABNORMAL LOW (ref 12.0–16.0)
MCH: 30.1 pg (ref 26.0–34.0)
MCHC: 33.3 g/dL (ref 32.0–36.0)
MCV: 90.4 fL (ref 80.0–100.0)
PLATELETS: 243 10*3/uL (ref 150–440)
RBC: 3.64 MIL/uL — AB (ref 3.80–5.20)
RDW: 13.7 % (ref 11.5–14.5)
WBC: 6.3 10*3/uL (ref 3.6–11.0)

## 2016-12-24 LAB — URINALYSIS, COMPLETE (UACMP) WITH MICROSCOPIC
BILIRUBIN URINE: NEGATIVE
Glucose, UA: NEGATIVE mg/dL
KETONES UR: NEGATIVE mg/dL
Leukocytes, UA: NEGATIVE
Nitrite: POSITIVE — AB
PROTEIN: 100 mg/dL — AB
SPECIFIC GRAVITY, URINE: 1.011 (ref 1.005–1.030)
SQUAMOUS EPITHELIAL / LPF: NONE SEEN
pH: 7 (ref 5.0–8.0)

## 2016-12-24 LAB — COMPREHENSIVE METABOLIC PANEL
ALT: 14 U/L (ref 14–54)
AST: 31 U/L (ref 15–41)
Albumin: 3.2 g/dL — ABNORMAL LOW (ref 3.5–5.0)
Alkaline Phosphatase: 54 U/L (ref 38–126)
Anion gap: 7 (ref 5–15)
BUN: 31 mg/dL — AB (ref 6–20)
CHLORIDE: 101 mmol/L (ref 101–111)
CO2: 29 mmol/L (ref 22–32)
CREATININE: 1.38 mg/dL — AB (ref 0.44–1.00)
Calcium: 9 mg/dL (ref 8.9–10.3)
GFR calc Af Amer: 40 mL/min — ABNORMAL LOW (ref >60)
GFR calc non Af Amer: 34 mL/min — ABNORMAL LOW (ref >60)
Glucose, Bld: 87 mg/dL (ref 65–99)
Potassium: 3.6 mmol/L (ref 3.5–5.1)
SODIUM: 137 mmol/L (ref 135–145)
Total Bilirubin: 0.5 mg/dL (ref 0.3–1.2)
Total Protein: 7 g/dL (ref 6.5–8.1)

## 2016-12-24 MED ORDER — SODIUM CHLORIDE 0.9 % IV SOLN
INTRAVENOUS | Status: DC
  Administered 2016-12-24: 19:00:00 via INTRAVENOUS

## 2016-12-24 MED ORDER — SENNOSIDES-DOCUSATE SODIUM 8.6-50 MG PO TABS: 1.0000 | ORAL_TABLET | Freq: Every evening | ORAL | Status: DC | PRN

## 2016-12-24 MED ORDER — AMLODIPINE BESYLATE 10 MG PO TABS: 10.0000 mg | ORAL_TABLET | Freq: Every day | ORAL | Status: DC

## 2016-12-24 MED ORDER — LEVOTHYROXINE SODIUM 50 MCG PO TABS
175.0000 ug | ORAL_TABLET | Freq: Every day | ORAL | Status: DC
  Administered 2016-12-25: 175 ug via ORAL
  Filled 2016-12-24: qty 1

## 2016-12-24 MED ORDER — ACETAMINOPHEN 650 MG RE SUPP: 650.0000 mg | Freq: Four times a day (QID) | RECTAL | Status: DC | PRN

## 2016-12-24 MED ORDER — ATORVASTATIN CALCIUM 20 MG PO TABS
20.0000 mg | ORAL_TABLET | Freq: Every day | ORAL | Status: DC
  Administered 2016-12-24 – 2016-12-25 (×2): 20 mg via ORAL
  Filled 2016-12-24: qty 1

## 2016-12-24 MED ORDER — ASPIRIN EC 325 MG PO TBEC
325.0000 mg | DELAYED_RELEASE_TABLET | Freq: Every day | ORAL | Status: DC
  Administered 2016-12-25: 325 mg via ORAL
  Filled 2016-12-24: qty 1

## 2016-12-24 MED ORDER — SODIUM CHLORIDE 0.9 % IV BOLUS (SEPSIS)
500.0000 mL | Freq: Once | INTRAVENOUS | Status: AC
  Administered 2016-12-24: 500 mL via INTRAVENOUS

## 2016-12-24 MED ORDER — BISACODYL 5 MG PO TBEC: 5.0000 mg | DELAYED_RELEASE_TABLET | Freq: Every day | ORAL | Status: DC | PRN

## 2016-12-24 MED ORDER — ALBUTEROL SULFATE (2.5 MG/3ML) 0.083% IN NEBU: 2.5000 mg | INHALATION_SOLUTION | RESPIRATORY_TRACT | Status: DC | PRN

## 2016-12-24 MED ORDER — FERROUS SULFATE 325 (65 FE) MG PO TABS
325.0000 mg | ORAL_TABLET | Freq: Two times a day (BID) | ORAL | Status: DC
  Administered 2016-12-25: 325 mg via ORAL
  Filled 2016-12-24: qty 1

## 2016-12-24 MED ORDER — ONDANSETRON HCL 4 MG/2ML IJ SOLN: 4.0000 mg | Freq: Four times a day (QID) | INTRAMUSCULAR | Status: DC | PRN

## 2016-12-24 MED ORDER — METOPROLOL SUCCINATE ER 25 MG PO TB24
25.0000 mg | ORAL_TABLET | Freq: Every day | ORAL | Status: DC
  Administered 2016-12-24: 25 mg via ORAL

## 2016-12-24 MED ORDER — APIXABAN 5 MG PO TABS
5.0000 mg | ORAL_TABLET | Freq: Two times a day (BID) | ORAL | Status: DC
  Administered 2016-12-24 – 2016-12-25 (×2): 5 mg via ORAL
  Filled 2016-12-24 (×2): qty 1

## 2016-12-24 MED ORDER — ACETAMINOPHEN 325 MG PO TABS: 650.0000 mg | ORAL_TABLET | Freq: Four times a day (QID) | ORAL | Status: DC | PRN

## 2016-12-24 MED ORDER — AMLODIPINE BESYLATE 10 MG PO TABS
10.0000 mg | ORAL_TABLET | Freq: Every day | ORAL | Status: DC
  Administered 2016-12-24: 10 mg via ORAL
  Filled 2016-12-24: qty 1

## 2016-12-24 MED ORDER — CEFTRIAXONE SODIUM IN DEXTROSE 20 MG/ML IV SOLN
1.0000 g | Freq: Once | INTRAVENOUS | Status: AC
  Administered 2016-12-24: 1 g via INTRAVENOUS
  Filled 2016-12-24: qty 50

## 2016-12-24 MED ORDER — HYDROCODONE-ACETAMINOPHEN 5-325 MG PO TABS: 1.0000 | ORAL_TABLET | ORAL | Status: DC | PRN

## 2016-12-24 MED ORDER — DEXTROSE 5 % IV SOLN
1.0000 g | INTRAVENOUS | Status: DC
  Filled 2016-12-24: qty 10

## 2016-12-24 MED ORDER — HYDRALAZINE HCL 20 MG/ML IJ SOLN
10.0000 mg | Freq: Four times a day (QID) | INTRAMUSCULAR | Status: DC | PRN
  Administered 2016-12-24: 10 mg via INTRAVENOUS
  Filled 2016-12-24: qty 1

## 2016-12-24 MED ORDER — METOPROLOL SUCCINATE ER 25 MG PO TB24
25.0000 mg | ORAL_TABLET | Freq: Every day | ORAL | Status: DC
  Filled 2016-12-24: qty 1

## 2016-12-24 MED ORDER — ATORVASTATIN CALCIUM 20 MG PO TABS
20.0000 mg | ORAL_TABLET | Freq: Every day | ORAL | Status: DC
  Filled 2016-12-24: qty 1

## 2016-12-24 MED ORDER — DONEPEZIL HCL 5 MG PO TABS
10.0000 mg | ORAL_TABLET | Freq: Two times a day (BID) | ORAL | Status: DC
  Administered 2016-12-24 – 2016-12-25 (×2): 10 mg via ORAL
  Filled 2016-12-24: qty 1
  Filled 2016-12-24 (×2): qty 2

## 2016-12-24 MED ORDER — BENAZEPRIL HCL 20 MG PO TABS
20.0000 mg | ORAL_TABLET | Freq: Every day | ORAL | Status: DC
  Filled 2016-12-24: qty 1

## 2016-12-24 MED ORDER — ONDANSETRON HCL 4 MG PO TABS: 4.0000 mg | ORAL_TABLET | Freq: Four times a day (QID) | ORAL | Status: DC | PRN

## 2016-12-24 NOTE — ED Notes (Signed)
Patient transported to X-ray 

## 2016-12-24 NOTE — Progress Notes (Signed)
ANTIBIOTIC CONSULT NOTE - INITIAL  Pharmacy Consult for Ceftriaxone  Indication: UTI  No Known Allergies  Patient Measurements: Height: 5\' 5"  (165.1 cm) Weight: 144 lb 10 oz (65.6 kg) IBW/kg (Calculated) : 57 Adjusted Body Weight:   Vital Signs: Temp: 99.3 F (37.4 C) (12/24 2010) Temp Source: Oral (12/24 2010) BP: 138/54 (12/24 2010) Pulse Rate: 78 (12/24 2010) Intake/Output from previous day: No intake/output data recorded. Intake/Output from this shift: No intake/output data recorded.  Labs: Recent Labs    12/24/16 1557  WBC 6.3  HGB 11.0*  PLT 243  CREATININE 1.38*   Estimated Creatinine Clearance: 27.8 mL/min (A) (by C-G formula based on SCr of 1.38 mg/dL (H)). No results for input(s): VANCOTROUGH, VANCOPEAK, VANCORANDOM, GENTTROUGH, GENTPEAK, GENTRANDOM, TOBRATROUGH, TOBRAPEAK, TOBRARND, AMIKACINPEAK, AMIKACINTROU, AMIKACIN in the last 72 hours.   Microbiology: No results found for this or any previous visit (from the past 720 hour(s)).  Medical History: Past Medical History:  Diagnosis Date  . Cancer [facility])    colon cancer   . Hemorrhoid   . Hypertension   . Thyroid disease     Medications:  Scheduled:  . amLODipine  10 mg Oral Daily  . apixaban  5 mg Oral BID  . [START ON 12/25/2016] aspirin  325 mg Oral Daily  . atorvastatin  20 mg Oral Daily  . [START ON 12/25/2016] benazepril  20 mg Oral Daily  . donepezil  10 mg Oral BID  . [START ON 12/25/2016] ferrous sulfate  325 mg Oral BID WC  . [START ON 12/25/2016] levothyroxine  175 mcg Oral QAC breakfast  . metoprolol succinate  25 mg Oral Daily   Assessment: CrCl = 27.8 ml/min   Goal of Therapy:  resolution of infection  Plan:  Expected duration 7 days with resolution of temperature and/or normalization of WBC   Ceftriaxone 1 gm IV Q24H ordered to continue on 12/25 @ 18:00.   Race Latour D 12/24/2016,8:12 PM

## 2016-12-24 NOTE — ED Provider Notes (Addendum)
[facility] Note  ____________________________________________   I have reviewed the triage vital signs and the nursing notes. Where available I have reviewed prior notes and, if possible and indicated, outside hospital notes.    HISTORY  Chief Complaint Altered Mental Status    HPI Haley Brooks is a 83 y.o. female who presents today complaining of feeling somewhat confused.  This mostly is based on the family, apparently according to family she lives by herself and is in very good mental status most of the time however in the middle the night she started to call them this night with confused questions about her car keys, they think she is not compliant with her blood pressure and other medications.  Patient states she took "some" of her medications but she is running out and she cannot tell me which when she is taking much when she is not.  She denies any fever chills or fall.  She denies any headache or stiff neck or abdominal pain or diarrhea.  She has no focal complaints but she does admit to feeling somewhat confused. Raquel Sarna states that she does not usually present this way and this is a break from her normal abilities where she usually is awake and alert and oriented at all times.  Level 5 chart caveat; no further history available due to patient status.   Past Medical History:  Diagnosis Date  . Cancer [facility])    colon cancer   . Hemorrhoid   . Hypertension   . Thyroid disease     Patient Active Problem List   Diagnosis Date Noted  . Hemorrhoid   . History of colon cancer 04/05/2015  . Chronic anemia 03/10/2015  . Osteopenia 12/11/2014  . CKD (chronic kidney disease) stage 3, GFR 30-59 ml/min (HCC) 06/18/2014  . SDAT (senile dementia of Alzheimer's type) 06/18/2014  . Acquired hypothyroidism 03/01/2014  . Essential hypertension 03/01/2014  . Mixed hyperlipidemia 03/01/2014    Past Surgical History:   Procedure Laterality Date  . COLON SURGERY    . FRACTURE SURGERY      Prior to Admission medications   Medication Sig Start Date End Date Taking? Authorizing Provider  alendronate (FOSAMAX) 70 MG tablet Take 1 tablet by mouth 1 day or 1 dose. 03/25/15   [provider]  amLODipine (NORVASC) 5 MG tablet Take 5 mg by mouth daily.    [provider]  apixaban (ELIQUIS) 5 MG TABS tablet Take 5 mg by mouth 2 (two) times daily.    [provider]  aspirin 81 MG chewable tablet Chew 81 mg by mouth daily.    [provider]  atorvastatin (LIPITOR) 20 MG tablet Take 20 mg by mouth daily.    [provider]  benazepril (LOTENSIN) 20 MG tablet Take 20 mg by mouth daily.    [provider]  conjugated estrogens (PREMARIN) vaginal cream Place 1 Applicatorful vaginally 2 (two) times a week. 04/19/16   [name], MD  levothyroxine (SYNTHROID, LEVOTHROID) 137 MCG tablet Take 137 mcg by mouth daily before breakfast.    [provider]  metoprolol succinate (TOPROL-XL) 25 MG 24 hr tablet Take 25 mg by mouth daily.    [provider]  traMADol (ULTRAM) 50 MG tablet Take 1 tablet (50 mg total) by mouth every 6 (six) hours as needed. 08/23/15   Harvest Dark, PA-C    Allergies Patient has no known allergies.  History reviewed. No pertinent family history.  Social History Social History   Tobacco Use  . Smoking status: Never Smoker  . Smokeless tobacco: Never Used  Substance Use Topics  . Alcohol use: No  . Drug use: No    Review of Systems Constitutional: No fever/chills Eyes: No visual changes. ENT: No sore throat. No stiff neck no neck pain Cardiovascular: Denies chest pain. Respiratory: Denies shortness of breath. Gastrointestinal:   no vomiting.  No diarrhea.  No constipation. Genitourinary: Negative for dysuria. Musculoskeletal: Negative lower extremity swelling Skin: Negative for rash. Neurological:  Negative for severe headaches, focal weakness or numbness.   ____________________________________________   PHYSICAL EXAM:  VITAL SIGNS: ED Triage Vitals [12/24/16 1549]  Enc Vitals Group     BP (!) 180/84     Pulse Rate 63     Resp 16     Temp 98.4 F (36.9 C)     Temp Source Oral     SpO2 97 %     Weight 144 lb 10 oz (65.6 kg)     Height 5\' 5"  (1.651 m)     Head Circumference      Peak Flow      Pain Score      Pain Loc      Pain Edu?      Excl. in [place]?     Constitutional: Alert and oriented to name and place has no idea what the date is initially answered 1933, her birthday in fact, and then was able to tell me that it is Wednesday.  Today is Monday.  Does know that Christmas is coming soon.. Well appearing and in no acute distress.  In the family this is different from baseline Eyes: Conjunctivae are normal Head: Atraumatic HEENT: No congestion/rhinnorhea. Mucous membranes are dry.  Oropharynx non-erythematous Neck:   Nontender with no meningismus, no masses, no stridor Cardiovascular: Normal rate, regular rhythm. Grossly normal heart sounds.  Good peripheral circulation. Respiratory: Normal respiratory effort.  No retractions. Lungs CTAB. Abdominal: Soft and nontender. No distention. No guarding no rebound Back:  There is no focal tenderness or step off.  there is no midline tenderness there are no lesions noted. there is no CVA tenderness Musculoskeletal: No lower extremity tenderness, no upper extremity tenderness. No joint effusions, no DVT signs strong distal pulses no edema Neurologic:  Normal speech and language. No gross focal neurologic deficits are appreciated.  Skin:  Skin is warm, dry and intact. No rash noted. Psychiatric: Mood and affect are normal. Speech and behavior are normal.  ____________________________________________   LABS (all labs ordered are listed, but only abnormal results are displayed)  Labs Reviewed  COMPREHENSIVE METABOLIC PANEL -  Abnormal; Notable for the following components:      Result Value   BUN 31 (*)    Creatinine, Ser 1.38 (*)    Albumin 3.2 (*)    GFR calc non Af Amer 34 (*)    GFR calc Af Amer 40 (*)    All other components within normal limits  CBC - Abnormal; Notable for the following components:   RBC 3.64 (*)    Hemoglobin 11.0 (*)    HCT 32.9 (*)    All other components within normal limits  URINALYSIS, COMPLETE (UACMP) WITH MICROSCOPIC    Pertinent labs  results that were available during my care of the patient were reviewed by me and considered in my medical decision making (see chart for details). ____________________________________________  EKG  I personally interpreted any EKGs ordered by me or triage  Sinus rhythm rate 55, sinus bradycardia noted, no acute ST elevation nonspecific ST changes noted normal axis ____________________________________________  RADIOLOGY  Pertinent labs & imaging results that were available during my care of the patient were reviewed by me and considered in my medical decision making (see chart for details). If possible, patient and/or family made aware of any abnormal findings.  No results found. ____________________________________________    PROCEDURES  Procedure(s) performed: None  Procedures  Critical Care performed: None  ____________________________________________   INITIAL IMPRESSION / ASSESSMENT AND PLAN / ED COURSE  Pertinent labs & imaging results that were available during my care of the patient were reviewed by me and considered in my medical decision making (see chart for details).  Patient here with confusion which is relatively abrupt in onset differential includes urinary tract infection, dehydration, CVA, head bleed, pneumonia, electrolyte abnormality B12 deficiency etc.  We will check urine, chest x-ray CT of the head as patient lives alone and she has therefore an unknown trauma status, and will reassess closely.  Given  elevated creatinine, will give her IV fluid  ----------------------------------------- 5:43 PM on 12/24/2016 -----------------------------------------  She with nitrite positive urinary tract infection most likely this is a cause of her confusion given her confusion and the fact that she lives at home we will give her IV antibiotics and admit for observation.    ____________________________________________   FINAL CLINICAL IMPRESSION(S) / ED DIAGNOSES  Final diagnoses:  None      This chart was dictated using voice recognition software.  Despite best efforts to proofread,  errors can occur which can change meaning.      Schuyler Amor, MD 12/24/16 1658    Schuyler Amor, MD 12/24/16 [phone]

## 2016-12-24 NOTE — ED Notes (Signed)
Admitting provider at bedside.

## 2016-12-24 NOTE — H&P (Addendum)
[facility] [place] NAME: Haley Brooks    MR#:  [id number]  DATE OF BIRTH:  [birth year]  DATE OF ADMISSION:  12/24/2016  PRIMARY CARE PHYSICIAN: Glendon Axe, MD   REQUESTING/REFERRING PHYSICIAN: Schuyler Amor, MD  CHIEF COMPLAINT:   Chief Complaint  Patient presents with  . Altered Mental Status   Confusion today HISTORY OF PRESENT ILLNESS:  Haley Brooks  is a 83 y.o. female with a known history of hypertension and thyroid disease.  The patient was sent to ED due to above chief complaints.  The patient denies any symptoms.  But the urinalysis showed UTI.  Dr. Burlene Arnt requested admission.  PAST MEDICAL HISTORY:   Past Medical History:  Diagnosis Date  . Cancer [facility])    colon cancer   . Hemorrhoid   . Hypertension   . Thyroid disease     PAST SURGICAL HISTORY:   Past Surgical History:  Procedure Laterality Date  . COLON SURGERY    . FRACTURE SURGERY      SOCIAL HISTORY:   Social History   Tobacco Use  . Smoking status: Never Smoker  . Smokeless tobacco: Never Used  Substance Use Topics  . Alcohol use: No    FAMILY HISTORY:   Family History  Problem Relation Age of Onset  . Colon cancer Mother   . Diabetes Father   . Cancer Brother     DRUG ALLERGIES:  No Known Allergies  REVIEW OF SYSTEMS:   Review of Systems  Constitutional: Negative for chills, fever and malaise/fatigue.  HENT: Negative for sore throat.   Eyes: Negative for blurred vision and double vision.  Respiratory: Negative for cough, hemoptysis, shortness of breath, wheezing and stridor.   Cardiovascular: Negative for chest pain, palpitations, orthopnea and leg swelling.  Gastrointestinal: Negative for abdominal pain, blood in stool, diarrhea, melena, nausea and vomiting.  Genitourinary: Negative for dysuria, flank pain and hematuria.  Musculoskeletal: Negative for back pain and joint pain.  Skin: Negative for rash.    Neurological: Negative for dizziness, sensory change, focal weakness, seizures, loss of consciousness, weakness and headaches.  Endo/Heme/Allergies: Negative for polydipsia.  Psychiatric/Behavioral: Negative for depression. The patient is not nervous/anxious.     MEDICATIONS AT HOME:   Prior to Admission medications   Medication Sig Start Date End Date Taking? Authorizing Provider  alendronate (FOSAMAX) 70 MG tablet Take 1 tablet by mouth 1 day or 1 dose. 03/25/15  Yes [provider]  amLODipine (NORVASC) 10 MG tablet Take 10 mg by mouth daily.    Yes [provider]  apixaban (ELIQUIS) 5 MG TABS tablet Take 5 mg by mouth 2 (two) times daily.   Yes [provider]  aspirin 325 MG EC tablet Take 325 mg by mouth daily.    Yes [provider]  atorvastatin (LIPITOR) 20 MG tablet Take 20 mg by mouth daily.   Yes [provider]  benazepril (LOTENSIN) 40 MG tablet Take 20 mg by mouth daily.   Yes [provider]  donepezil (ARICEPT) 10 MG tablet Take 1 tablet by mouth 2 (two) times daily. 12/21/16  Yes [provider]  ferrous sulfate 325 (65 FE) MG tablet Take 325 mg by mouth 2 (two) times daily with a meal.   Yes [provider]  levothyroxine (SYNTHROID, LEVOTHROID) 175 MCG tablet Take 175 mcg by mouth daily before breakfast.    Yes [provider]  metoprolol succinate (TOPROL-XL) 25 MG 24  hr tablet Take 25 mg by mouth daily.   Yes [provider]  conjugated estrogens (PREMARIN) vaginal cream Place 1 Applicatorful vaginally 2 (two) times a week. Patient not taking: Reported on 12/24/2016 04/19/16   [name], MD  traMADol (ULTRAM) 50 MG tablet Take 1 tablet (50 mg total) by mouth every 6 (six) hours as needed. Patient not taking: Reported on 12/24/2016 08/23/15   Harvest Dark, PA-C      VITAL SIGNS:  Blood pressure (!) 186/74, pulse 62, temperature 98.4 F (36.9 C), temperature source  Oral, resp. rate (!) 22, height 5\' 5"  (1.651 m), weight 144 lb 10 oz (65.6 kg), SpO2 99 %.  PHYSICAL EXAMINATION:  Physical Exam  GENERAL:  83 y.o.-year-old patient lying in the bed with no acute distress.  EYES: Pupils equal, round, reactive to light and accommodation. No scleral icterus. Extraocular muscles intact.  HEENT: Head atraumatic, normocephalic. Oropharynx and nasopharynx clear.  NECK:  Supple, no jugular venous distention. No thyroid enlargement, no tenderness.  LUNGS: Normal breath sounds bilaterally, no wheezing, rales,rhonchi or crepitation. No use of accessory muscles of respiration.  CARDIOVASCULAR: S1, S2 normal. No murmurs, rubs, or gallops.  ABDOMEN: Soft, nontender, nondistended. Bowel sounds present. No organomegaly or mass.  EXTREMITIES: No pedal edema, cyanosis, or clubbing.  NEUROLOGIC: Cranial nerves II through XII are intact. Muscle strength 5/5 in all extremities. Sensation intact. Gait not checked.  PSYCHIATRIC: The patient is alert and oriented x 3.  SKIN: No obvious rash, lesion, or ulcer.   LABORATORY PANEL:   CBC Recent Labs  Lab 12/24/16 1557  WBC 6.3  HGB 11.0*  HCT 32.9*  PLT 243   ------------------------------------------------------------------------------------------------------------------  Chemistries  Recent Labs  Lab 12/24/16 1557  NA 137  K 3.6  CL 101  CO2 29  GLUCOSE 87  BUN 31*  CREATININE 1.38*  CALCIUM 9.0  AST 31  ALT 14  ALKPHOS 54  BILITOT 0.5   ------------------------------------------------------------------------------------------------------------------  Cardiac Enzymes No results for input(s): TROPONINI in the last 168 hours. ------------------------------------------------------------------------------------------------------------------  RADIOLOGY:  Dg Chest 2 View  Result Date: 12/24/2016 CLINICAL DATA:  Confusion altered mental status EXAM: CHEST  2 VIEW COMPARISON:  01/26/2012, CT 09/14/2015  FINDINGS: Probable tiny pleural effusion. Bibasilar atelectasis. No focal consolidation. Stable slightly enlarged cardiomediastinal silhouette with aortic atherosclerosis. No pneumothorax. Partially visible surgical rod and screws in the right humerus. Severe compression deformity of T12 is chronic. IMPRESSION: 1. Probable tiny pleural effusions. Bibasilar atelectasis. No focal consolidation 2. Mild cardiomegaly Electronically Signed   By: Donavan Foil M.D.   On: 12/24/2016 17:15   Ct Head Wo Contrast  Result Date: 12/24/2016 CLINICAL DATA:  Confusion. Per physician note: Patient complaining of feeling somewhat confused. This mostly is based on the family, apparently according to family she lives by herself and is in very good mental status most of the time however in the middle the night she started to call them this night with confused questions about her car keys, they think she is not compliant with her blood pressure and other medications. Patient states she took "some" of her medications but she is running out and she cannot tell me which when she is taking much when she is not. She denies any fever chills or fall. EXAM: CT HEAD WITHOUT CONTRAST TECHNIQUE: Contiguous axial images were obtained from the base of the skull through the vertex without intravenous contrast. COMPARISON:  Brain MRI, 01/28/2012.  Head CT, 01/27/2012. FINDINGS: Brain: No evidence of acute infarction,  hemorrhage, hydrocephalus, extra-axial collection or mass lesion/mass effect. Patchy white matter hypoattenuation is noted, consistent with chronic microvascular ischemic change, mild to moderate severity. More well-defined foci of hypoattenuation are noted in the deep white matter, along the anterior margins of base a ganglia and posterior right external capsule consistent with old lacune infarcts. Vascular: No hyperdense vessel or unexpected calcification. Skull: Normal. Negative for fracture or focal lesion. Sinuses/Orbits: Globes  orbits are unremarkable. Visualized sinuses and mastoid air cells are clear. Other: None. IMPRESSION: 1. No acute intracranial abnormalities. 2. Chronic microvascular ischemic change and old lacune infarcts. Electronically Signed   By: Lajean Manes M.D.   On: 12/24/2016 17:28      IMPRESSION AND PLAN:   Acute metabolic encephalopathy due to UTI. The patient will be placed for observation.  Start Rocephin IV and follow-up urine culture.  Acute renal failure due to dehydration.  IV fluid support in the follow-up BMP. Hypertension.  Continue hypertension medication.  History of CVA.  Continue Eliquis, aspirin and Lipitor.  All the records are reviewed and case discussed with ED provider. Management plans discussed with the patient, her daughter and they are in agreement.  CODE STATUS: Full Code  TOTAL TIME TAKING CARE OF THIS PATIENT: 43 minutes.    Demetrios Loll M.D on 12/24/2016 at 6:43 PM  Between 7am to 6pm - Pager - [phone]  After 6pm go to [email or web address] - Proofreader  [employer] [facility]  Office  [phone]  CC: Primary care physician; Glendon Axe, MD   Note: This dictation was prepared with Dragon dictation along with smaller phrase technology. Any transcriptional errors that result from this process are unin

## 2016-12-24 NOTE — ED Triage Notes (Signed)
Pt to ED via EMS from home c/o confusion.  Per EMS daughter stated patient was confused.  EMS vitals 140 CBG, 190/90 BP, 65 HR, 98% RA, answering questions correctly for EMS and able to walk.  Pt presents alert and oriented to self, month, situation, disoriented to year, denies pain or SOB, denies urinary symptoms.

## 2016-12-25 LAB — BASIC METABOLIC PANEL
ANION GAP: 6 (ref 5–15)
BUN: 31 mg/dL — ABNORMAL HIGH (ref 6–20)
CHLORIDE: 106 mmol/L (ref 101–111)
CO2: 25 mmol/L (ref 22–32)
Calcium: 8.8 mg/dL — ABNORMAL LOW (ref 8.9–10.3)
Creatinine, Ser: 1.38 mg/dL — ABNORMAL HIGH (ref 0.44–1.00)
GFR calc Af Amer: 40 mL/min — ABNORMAL LOW (ref >60)
GFR, EST NON AFRICAN AMERICAN: 34 mL/min — AB (ref >60)
Glucose, Bld: 103 mg/dL — ABNORMAL HIGH (ref 65–99)
POTASSIUM: 3.9 mmol/L (ref 3.5–5.1)
SODIUM: 137 mmol/L (ref 135–145)

## 2016-12-25 LAB — CBC
HEMATOCRIT: 32.1 % — AB (ref 35.0–47.0)
HEMOGLOBIN: 10.6 g/dL — AB (ref 12.0–16.0)
MCH: 30.2 pg (ref 26.0–34.0)
MCHC: 33.1 g/dL (ref 32.0–36.0)
MCV: 91.1 fL (ref 80.0–100.0)
Platelets: 236 10*3/uL (ref 150–440)
RBC: 3.53 MIL/uL — AB (ref 3.80–5.20)
RDW: 13.7 % (ref 11.5–14.5)
WBC: 6.8 10*3/uL (ref 3.6–11.0)

## 2016-12-25 MED ORDER — AMLODIPINE BESYLATE 10 MG PO TABS
10.0000 mg | ORAL_TABLET | Freq: Every day | ORAL | Status: DC
  Administered 2016-12-25: 10 mg via ORAL
  Filled 2016-12-25: qty 1

## 2016-12-25 MED ORDER — METOPROLOL SUCCINATE ER 25 MG PO TB24
25.0000 mg | ORAL_TABLET | Freq: Every day | ORAL | Status: DC
  Administered 2016-12-25: 25 mg via ORAL
  Filled 2016-12-25: qty 1

## 2016-12-25 MED ORDER — CEPHALEXIN 500 MG PO CAPS: 500.0000 mg | ORAL_CAPSULE | Freq: Two times a day (BID) | ORAL | 0 refills | Status: DC

## 2016-12-25 MED ORDER — BENAZEPRIL HCL 20 MG PO TABS
20.0000 mg | ORAL_TABLET | Freq: Every day | ORAL | Status: DC
  Administered 2016-12-25: 20 mg via ORAL
  Filled 2016-12-25: qty 1

## 2016-12-25 NOTE — Discharge Summary (Signed)
[facility] [place] NAME: Haley Brooks    MR#:  [id number]  DATE OF BIRTH:  10-30-1931  DATE OF ADMISSION:  12/24/2016   ADMITTING PHYSICIAN: Demetrios Loll, MD  DATE OF DISCHARGE: 12/25/2016  PRIMARY CARE PHYSICIAN: Glendon Axe, MD   ADMISSION DIAGNOSIS:  Dehydration [E86.0] Lower urinary tract infectious disease [N39.0] Confusion [R41.0] UTI (urinary tract infection) [N39.0] DISCHARGE DIAGNOSIS:  Active Problems:   Acute metabolic encephalopathy   UTI (urinary tract infection)  SECONDARY DIAGNOSIS:   Past Medical History:  Diagnosis Date  . Cancer [facility])    colon cancer   . Hemorrhoid   . Hypertension   . Thyroid disease    HOSPITAL COURSE:   Acute metabolic encephalopathy due to UTI. Improved with rocephin IV, changed to Keflex and follow-up urine culture as outpatient.  Acute renal failure due to dehydration.  She is treated with IV fluid support and follow-up BMP as outpatient.  Cr. 1.38.  Hypertension.  Continue hypertension medication.  History of CVA.  Continue Eliquis, aspirin and Lipitor. DISCHARGE CONDITIONS:  Stable, discharged home today. CONSULTS OBTAINED:   DRUG ALLERGIES:  No Known Allergies DISCHARGE MEDICATIONS:   Allergies as of 12/25/2016   No Known Allergies     Medication List    TAKE these medications   alendronate 70 MG tablet Commonly known as:  FOSAMAX Take 1 tablet by mouth 1 day or 1 dose.   amLODipine 10 MG tablet Commonly known as:  NORVASC Take 10 mg by mouth daily.   aspirin 325 MG EC tablet Take 325 mg by mouth daily.   atorvastatin 20 MG tablet Commonly known as:  LIPITOR Take 20 mg by mouth daily.   benazepril 40 MG tablet Commonly known as:  LOTENSIN Take 20 mg by mouth daily.   cephALEXin 500 MG capsule Commonly known as:  KEFLEX Take 1 capsule (500 mg total) by mouth 2 (two) times daily.   conjugated estrogens vaginal cream Commonly known as:   PREMARIN Place 1 Applicatorful vaginally 2 (two) times a week.   donepezil 10 MG tablet Commonly known as:  ARICEPT Take 1 tablet by mouth 2 (two) times daily.   ELIQUIS 5 MG Tabs tablet Generic drug:  apixaban Take 5 mg by mouth 2 (two) times daily.   ferrous sulfate 325 (65 FE) MG tablet Take 325 mg by mouth 2 (two) times daily with a meal.   levothyroxine 175 MCG tablet Commonly known as:  SYNTHROID, LEVOTHROID Take 175 mcg by mouth daily before breakfast.   metoprolol succinate 25 MG 24 hr tablet Commonly known as:  TOPROL-XL Take 25 mg by mouth daily.   traMADol 50 MG tablet Commonly known as:  ULTRAM Take 1 tablet (50 mg total) by mouth every 6 (six) hours as needed.        DISCHARGE INSTRUCTIONS:  See AVS.  If you experience worsening of your admission symptoms, develop shortness of breath, life threatening emergency, suicidal or homicidal thoughts you must seek medical attention immediately by calling 911 or calling your MD immediately  if symptoms less severe.  You Must read complete instructions/literature along with all the possible adverse reactions/side effects for all the Medicines you take and that have been prescribed to you. Take any new Medicines after you have completely understood and accpet all the possible adverse reactions/side effects.   Please note  You were cared for by a hospitalist during your hospital stay. If you have any questions about  your discharge medications or the care you received while you were in the hospital after you are discharged, you can call the unit and asked to speak with the hospitalist on call if the hospitalist that took care of you is not available. Once you are discharged, your primary care physician will handle any further medical issues. Please note that NO REFILLS for any discharge medications will be authorized once you are discharged, as it is imperative that you return to your primary care physician (or establish a  relationship with a primary care physician if you do not have one) for your aftercare needs so that they can reassess your need for medications and monitor your lab values.    On the day of Discharge:  VITAL SIGNS:  Blood pressure (!) 160/67, pulse 67, temperature 98 F (36.7 C), temperature source Oral, resp. rate 20, height 5\' 5"  (1.651 m), weight 144 lb 10 oz (65.6 kg), SpO2 96 %. PHYSICAL EXAMINATION:  GENERAL:  83 y.o.-year-old patient lying in the bed with no acute distress.  EYES: Pupils equal, round, reactive to light and accommodation. No scleral icterus. Extraocular muscles intact.  HEENT: Head atraumatic, normocephalic. Oropharynx and nasopharynx clear.  NECK:  Supple, no jugular venous distention. No thyroid enlargement, no tenderness.  LUNGS: Normal breath sounds bilaterally, no wheezing, rales,rhonchi or crepitation. No use of accessory muscles of respiration.  CARDIOVASCULAR: S1, S2 normal. No murmurs, rubs, or gallops.  ABDOMEN: Soft, non-tender, non-distended. Bowel sounds present. No organomegaly or mass.  EXTREMITIES: No pedal edema, cyanosis, or clubbing.  NEUROLOGIC: Cranial nerves II through XII are intact. Muscle strength 5/5 in all extremities. Sensation intact. Gait not checked.  PSYCHIATRIC: The patient is alert and oriented x 3.  SKIN: No obvious rash, lesion, or ulcer.  DATA REVIEW:   CBC Recent Labs  Lab 12/25/16 0408  WBC 6.8  HGB 10.6*  HCT 32.1*  PLT 236    Chemistries  Recent Labs  Lab 12/24/16 1557 12/25/16 0408  NA 137 137  K 3.6 3.9  CL 101 106  CO2 29 25  GLUCOSE 87 103*  BUN 31* 31*  CREATININE 1.38* 1.38*  CALCIUM 9.0 8.8*  AST 31  --   ALT 14  --   ALKPHOS 54  --   BILITOT 0.5  --      Microbiology Results  No results found for this or any previous visit.  RADIOLOGY:  Dg Chest 2 View  Result Date: 12/24/2016 CLINICAL DATA:  Confusion altered mental status EXAM: CHEST  2 VIEW COMPARISON:  01/26/2012, CT 09/14/2015  FINDINGS: Probable tiny pleural effusion. Bibasilar atelectasis. No focal consolidation. Stable slightly enlarged cardiomediastinal silhouette with aortic atherosclerosis. No pneumothorax. Partially visible surgical rod and screws in the right humerus. Severe compression deformity of T12 is chronic. IMPRESSION: 1. Probable tiny pleural effusions. Bibasilar atelectasis. No focal consolidation 2. Mild cardiomegaly Electronically Signed   By: Donavan Foil M.D.   On: 12/24/2016 17:15   Ct Head Wo Contrast  Result Date: 12/24/2016 CLINICAL DATA:  Confusion. Per physician note: Patient complaining of feeling somewhat confused. This mostly is based on the family, apparently according to family she lives by herself and is in very good mental status most of the time however in the middle the night she started to call them this night with confused questions about her car keys, they think she is not compliant with her blood pressure and other medications. Patient states she took "some" of her medications but she is running  out and she cannot tell me which when she is taking much when she is not. She denies any fever chills or fall. EXAM: CT HEAD WITHOUT CONTRAST TECHNIQUE: Contiguous axial images were obtained from the base of the skull through the vertex without intravenous contrast. COMPARISON:  Brain MRI, 01/28/2012.  Head CT, 01/27/2012. FINDINGS: Brain: No evidence of acute infarction, hemorrhage, hydrocephalus, extra-axial collection or mass lesion/mass effect. Patchy white matter hypoattenuation is noted, consistent with chronic microvascular ischemic change, mild to moderate severity. More well-defined foci of hypoattenuation are noted in the deep white matter, along the anterior margins of base a ganglia and posterior right external capsule consistent with old lacune infarcts. Vascular: No hyperdense vessel or unexpected calcification. Skull: Normal. Negative for fracture or focal lesion. Sinuses/Orbits: Globes  orbits are unremarkable. Visualized sinuses and mastoid air cells are clear. Other: None. IMPRESSION: 1. No acute intracranial abnormalities. 2. Chronic microvascular ischemic change and old lacune infarcts. Electronically Signed   By: Lajean Manes M.D.   On: 12/24/2016 17:28     Management plans discussed with the patient, family and they are in agreement.  CODE STATUS: Full Code   TOTAL TIME TAKING CARE OF THIS PATIENT: 22 minutes.    Demetrios Loll M.D on 12/25/2016 at 12:56 PM  Between 7am to 6pm - Pager - [phone]  After 6pm go to [email or web address] - Proofreader  [facility]  Hospitalists  Office  [phone]  CC: Primary care physician; Glendon Axe, MD   Note: This dictation was prepared with Dragon dictation along with smaller phrase technology. Any transcriptional errors that result from this process are unintentional.

## 2016-12-25 NOTE — Discharge Instructions (Signed)
Heart healthy diet

## 2016-12-25 NOTE — Progress Notes (Signed)
Discharge teaching given to patient, patient verbalized understanding and had no questions. Patient IV removed. Patient will be transported home by family. All patient belongings gathered prior to leaving.  

## 2016-12-25 NOTE — Progress Notes (Signed)
After patient stated that she had called her ride and no one showed up after a while, nurse contacted daughter and asked if she ws notified that her mother was discharged. Daughter stated " well she called earlier and stated that she was, but I was use to you guys calling, but I'm on my way".

## 2016-12-27 LAB — URINE CULTURE: Culture: >=100000 — AB

## 2017-01-18 ENCOUNTER — Ambulatory Visit: Payer: Medicare Other | Admitting: Obstetrics and Gynecology

## 2017-01-18 ENCOUNTER — Encounter: Payer: Self-pay | Admitting: Obstetrics and Gynecology

## 2017-01-18 VITALS — BP 142/66 | HR 75 | Wt 144.0 lb

## 2017-01-18 DIAGNOSIS — N8110 Cystocele, unspecified: Secondary | ICD-10-CM | POA: Diagnosis not present

## 2017-01-18 NOTE — Progress Notes (Signed)
Obstetrics & Gynecology Office Visit   Chief Complaint:  Chief Complaint  Patient presents with  . Pessary Check    History of Present Illness: Haley Brooks is a 84 y.o. female who is seen today for a follow-up appointment for a  pessary check. She is using a size 5 ring with support pessary for anterior and posterior vaginal vault prolapse  She Denies vaginal bleeding.  She Denies vaginal discharge.  She is voiding and defecating without difficulty.  The patient was admitted over the Christmas Holidays for pyelonephritis.  She currently denies any urinary symptoms    Review of Systems: Review of Systems  Constitutional: Negative for chills and fever.  Gastrointestinal: Negative for abdominal pain.  Genitourinary: Negative for dysuria, frequency and urgency.  Skin: Negative for itching and rash.     Past Medical History:  Past Medical History:  Diagnosis Date  . Cancer [facility])    colon cancer   . Hemorrhoid   . Hypertension   . Thyroid disease     Past Surgical History:  Past Surgical History:  Procedure Laterality Date  . COLON SURGERY    . FRACTURE SURGERY      Gynecologic History: No LMP recorded. Patient is postmenopausal.  Obstetric History: G3P3  Family History:  Family History  Problem Relation Age of Onset  . Colon cancer Mother   . Diabetes Father   . Cancer Brother     Social History:  Social History   Socioeconomic History  . Marital status: Married    Spouse name: Not on file  . Number of children: Not on file  . Years of education: Not on file  . Highest education level: Not on file  Social Needs  . Financial resource strain: Not on file  . Food insecurity - worry: Not on file  . Food insecurity - inability: Not on file  . Transportation needs - medical: Not on file  . Transportation needs - non-medical: Not on file  Occupational History  . Not on file  Tobacco Use  . Smoking status: Never Smoker  . Smokeless tobacco:  Never Used  Substance and Sexual Activity  . Alcohol use: No  . Drug use: No  . Sexual activity: No  Other Topics Concern  . Not on file  Social History Narrative  . Not on file    Allergies:  No Known Allergies  Medications: Prior to Admission medications   Medication Sig Start Date End Date Taking? Authorizing Provider  alendronate (FOSAMAX) 70 MG tablet Take 1 tablet by mouth 1 day or 1 dose. 03/25/15   [provider]  amLODipine (NORVASC) 10 MG tablet Take 10 mg by mouth daily.     [provider]  apixaban (ELIQUIS) 5 MG TABS tablet Take 5 mg by mouth 2 (two) times daily.    [provider]  aspirin 325 MG EC tablet Take 325 mg by mouth daily.     [provider]  atorvastatin (LIPITOR) 20 MG tablet Take 20 mg by mouth daily.    [provider]  benazepril (LOTENSIN) 40 MG tablet Take 20 mg by mouth daily.    [provider]  cephALEXin (KEFLEX) 500 MG capsule Take 1 capsule (500 mg total) by mouth 2 (two) times daily. 12/25/16   Demetrios Loll, MD  conjugated estrogens (PREMARIN) vaginal cream Place 1 Applicatorful vaginally 2 (two) times a week. Patient not taking: Reported on 12/24/2016 04/19/16   [name], MD  donepezil (ARICEPT) 10 MG tablet Take 1 tablet by mouth 2 (two) times daily. 12/21/16   [provider]  ferrous sulfate 325 (65 FE) MG tablet Take 325 mg by mouth 2 (two) times daily with a meal.    [provider]  levothyroxine (SYNTHROID, LEVOTHROID) 175 MCG tablet Take 175 mcg by mouth daily before breakfast.     [provider]  metoprolol succinate (TOPROL-XL) 25 MG 24 hr tablet Take 25 mg by mouth daily.    [provider]  traMADol (ULTRAM) 50 MG tablet Take 1 tablet (50 mg total) by mouth every 6 (six) hours as needed. Patient not taking: Reported on 12/24/2016 08/23/15   Harvest Dark, PA-C    Physical Exam Vitals:  Vitals:   01/18/17 1000  BP: (!) 142/66    Pulse: 75   No LMP recorded. Patient is postmenopausal.  General: NAD HEENT: normocephalic, anicteric Pulmonary: No increased work of breathing Genitourinary:  External: Normal external female genitalia.  Normal urethral meatus, normal Bartholin's and Skene's glands.    Vagina: Normal vaginal mucosa, stable anterior and posterior wall prolapse, no evidence of erosions.    Cervix: Grossly normal in appearance, no bleeding  Uterus: Non-enlarged, mobile, normal contour.  No CMT  Adnexa: ovaries non-enlarged, no adnexal masses  Rectal: deferred  Lymphatic: no evidence of inguinal lymphadenopathy Extremities: no edema, erythema, or tenderness Neurologic: Grossly intact Psychiatric: mood appropriate, affect full  Female chaperone present for pelvic and breast  portions of the physical exam  Assessment: 84 y.o. G3P3 pessary change  Plan: Problem List Items Addressed This Visit    None    Visit Diagnoses    Vaginal prolapse    -  Primary      - Recent admission for pyelonephritis doing well since - Pessary exchanged, no ulcerations or errosions, follow up 3 months for repeat pessary check

## 2017-01-22 ENCOUNTER — Ambulatory Visit: Payer: Medicare Other | Admitting: Obstetrics and Gynecology

## 2017-02-18 ENCOUNTER — Inpatient Hospital Stay
Admission: EM | Admit: 2017-02-18 | Discharge: 2017-02-22 | DRG: 689 | Disposition: A | Discharge status: Home Health | Payer: Medicare Other | Attending: Internal Medicine | Admitting: Internal Medicine

## 2017-02-18 ENCOUNTER — Other Ambulatory Visit: Payer: Self-pay

## 2017-02-18 DIAGNOSIS — E039 Hypothyroidism, unspecified: Secondary | ICD-10-CM | POA: Diagnosis present

## 2017-02-18 DIAGNOSIS — Z7982 Long term (current) use of aspirin: Secondary | ICD-10-CM | POA: Diagnosis not present

## 2017-02-18 DIAGNOSIS — I129 Hypertensive chronic kidney disease with stage 1 through stage 4 chronic kidney disease, or unspecified chronic kidney disease: Secondary | ICD-10-CM | POA: Diagnosis present

## 2017-02-18 DIAGNOSIS — N390 Urinary tract infection, site not specified: Secondary | ICD-10-CM | POA: Diagnosis present

## 2017-02-18 DIAGNOSIS — Z79890 Hormone replacement therapy (postmenopausal): Secondary | ICD-10-CM | POA: Diagnosis not present

## 2017-02-18 DIAGNOSIS — N183 Chronic kidney disease, stage 3 (moderate): Secondary | ICD-10-CM | POA: Diagnosis present

## 2017-02-18 DIAGNOSIS — Z79899 Other long term (current) drug therapy: Secondary | ICD-10-CM | POA: Diagnosis not present

## 2017-02-18 DIAGNOSIS — R410 Disorientation, unspecified: Secondary | ICD-10-CM

## 2017-02-18 DIAGNOSIS — E782 Mixed hyperlipidemia: Secondary | ICD-10-CM | POA: Diagnosis present

## 2017-02-18 DIAGNOSIS — Z79891 Long term (current) use of opiate analgesic: Secondary | ICD-10-CM | POA: Diagnosis not present

## 2017-02-18 DIAGNOSIS — G9341 Metabolic encephalopathy: Secondary | ICD-10-CM | POA: Diagnosis present

## 2017-02-18 DIAGNOSIS — Z85038 Personal history of other malignant neoplasm of large intestine: Secondary | ICD-10-CM

## 2017-02-18 DIAGNOSIS — G309 Alzheimer's disease, unspecified: Secondary | ICD-10-CM | POA: Diagnosis present

## 2017-02-18 DIAGNOSIS — F0280 Dementia in other diseases classified elsewhere without behavioral disturbance: Secondary | ICD-10-CM | POA: Diagnosis present

## 2017-02-18 DIAGNOSIS — Z7901 Long term (current) use of anticoagulants: Secondary | ICD-10-CM | POA: Diagnosis not present

## 2017-02-18 DIAGNOSIS — G9340 Encephalopathy, unspecified: Secondary | ICD-10-CM | POA: Diagnosis present

## 2017-02-18 DIAGNOSIS — R531 Weakness: Secondary | ICD-10-CM

## 2017-02-18 LAB — URINALYSIS, COMPLETE (UACMP) WITH MICROSCOPIC
Bilirubin Urine: NEGATIVE
GLUCOSE, UA: NEGATIVE mg/dL
HGB URINE DIPSTICK: NEGATIVE
KETONES UR: NEGATIVE mg/dL
NITRITE: NEGATIVE
PROTEIN: 100 mg/dL — AB
Specific Gravity, Urine: 1.011 (ref 1.005–1.030)
pH: 6 (ref 5.0–8.0)

## 2017-02-18 LAB — CBC
HEMATOCRIT: 34.1 % — AB (ref 35.0–47.0)
HEMOGLOBIN: 11.2 g/dL — AB (ref 12.0–16.0)
MCH: 29 pg (ref 26.0–34.0)
MCHC: 32.8 g/dL (ref 32.0–36.0)
MCV: 88.5 fL (ref 80.0–100.0)
Platelets: 258 10*3/uL (ref 150–440)
RBC: 3.85 MIL/uL (ref 3.80–5.20)
RDW: 13.4 % (ref 11.5–14.5)
WBC: 5.9 10*3/uL (ref 3.6–11.0)

## 2017-02-18 LAB — BASIC METABOLIC PANEL
ANION GAP: 8 (ref 5–15)
BUN: 26 mg/dL — ABNORMAL HIGH (ref 6–20)
CALCIUM: 9 mg/dL (ref 8.9–10.3)
CO2: 29 mmol/L (ref 22–32)
Chloride: 102 mmol/L (ref 101–111)
Creatinine, Ser: 1.12 mg/dL — ABNORMAL HIGH (ref 0.44–1.00)
GFR calc non Af Amer: 44 mL/min — ABNORMAL LOW (ref >60)
GFR, EST AFRICAN AMERICAN: 51 mL/min — AB (ref >60)
GLUCOSE: 147 mg/dL — AB (ref 65–99)
POTASSIUM: 3.6 mmol/L (ref 3.5–5.1)
Sodium: 139 mmol/L (ref 135–145)

## 2017-02-18 MED ORDER — SODIUM CHLORIDE 0.9 % IV SOLN
1.0000 g | Freq: Once | INTRAVENOUS | Status: AC
  Administered 2017-02-18: 1 g via INTRAVENOUS
  Filled 2017-02-18: qty 10

## 2017-02-18 MED ORDER — SODIUM CHLORIDE 0.9 % IV BOLUS (SEPSIS)
500.0000 mL | Freq: Once | INTRAVENOUS | Status: AC
  Administered 2017-02-18: 500 mL via INTRAVENOUS

## 2017-02-18 NOTE — ED Triage Notes (Signed)
FIRST NURSE NOTE-family brought for confusion and elevated BP. Pt declined wheel chair. Is alert.

## 2017-02-18 NOTE — ED Provider Notes (Addendum)
[facility] Emergency Department Provider Note  Time seen: 8:58 PM  I have reviewed the triage vital signs and the nursing notes.   HISTORY  Chief Complaint Weakness    HPI AZARIYAH LUHRS is a 84 y.o. female with a past medical history of hypertension, CKD, presents to the emergency department for confusion and weakness.  According to the daughter for the past 2 days she has had very slight confusion, patient has been complaining of increased weakness over the past 2 days.  Denies any nausea vomiting, diarrhea, dysuria, fever, cough or congestion.  Patient denies any other complaints today besides mild weakness.  Past Medical History:  Diagnosis Date  . Cancer [facility])    colon cancer   . Hemorrhoid   . Hypertension   . Thyroid disease     Patient Active Problem List   Diagnosis Date Noted  . Acute metabolic encephalopathy 94/76/5465  . UTI (urinary tract infection) 12/24/2016  . Hemorrhoid   . History of colon cancer 04/05/2015  . Chronic anemia 03/10/2015  . Osteopenia 12/11/2014  . CKD (chronic kidney disease) stage 3, GFR 30-59 ml/min (HCC) 06/18/2014  . SDAT (senile dementia of Alzheimer's type) 06/18/2014  . Acquired hypothyroidism 03/01/2014  . Essential hypertension 03/01/2014  . Mixed hyperlipidemia 03/01/2014    Past Surgical History:  Procedure Laterality Date  . COLON SURGERY    . FRACTURE SURGERY      Prior to Admission medications   Medication Sig Start Date End Date Taking? Authorizing Provider  alendronate (FOSAMAX) 70 MG tablet Take 1 tablet by mouth 1 day or 1 dose. 03/25/15   [provider]  amLODipine (NORVASC) 10 MG tablet Take 10 mg by mouth daily.     [provider]  apixaban (ELIQUIS) 5 MG TABS tablet Take 5 mg by mouth 2 (two) times daily.    [provider]  aspirin 325 MG EC tablet Take 325 mg by mouth daily.     [provider]  atorvastatin (LIPITOR) 20 MG tablet Take 20 mg by  mouth daily.    [provider]  benazepril (LOTENSIN) 40 MG tablet Take 20 mg by mouth daily.    [provider]  cephALEXin (KEFLEX) 500 MG capsule Take 1 capsule (500 mg total) by mouth 2 (two) times daily. Patient not taking: Reported on 02/18/2017 12/25/16   Demetrios Loll, MD  conjugated estrogens (PREMARIN) vaginal cream Place 1 Applicatorful vaginally 2 (two) times a week. Patient not taking: Reported on 12/24/2016 04/19/16   [name], MD  donepezil (ARICEPT) 10 MG tablet Take 1 tablet by mouth 2 (two) times daily. 12/21/16   [provider]  ferrous sulfate 325 (65 FE) MG tablet Take 325 mg by mouth 2 (two) times daily with a meal.    [provider]  levothyroxine (SYNTHROID, LEVOTHROID) 175 MCG tablet Take 175 mcg by mouth daily before breakfast.     [provider]  metoprolol succinate (TOPROL-XL) 25 MG 24 hr tablet Take 25 mg by mouth daily.    [provider]  traMADol (ULTRAM) 50 MG tablet Take 1 tablet (50 mg total) by mouth every 6 (six) hours as needed. Patient not taking: Reported on 12/24/2016 08/23/15   Harvest Dark, PA-C    No Known Allergies  Family History  Problem Relation Age of Onset  . Colon cancer Mother   . Diabetes Father   . Cancer Brother     Social History Social History  Tobacco Use  . Smoking status: Never Smoker  . Smokeless tobacco: Never Used  Substance Use Topics  . Alcohol use: No  . Drug use: No    Review of Systems Constitutional: Negative for fever. Eyes: Negative for visual complaints ENT: Negative for recent illness/congestion Cardiovascular: Negative for chest pain. Respiratory: Negative for shortness of breath. Gastrointestinal: Negative for abdominal pain, vomiting and diarrhea. Genitourinary: Denies dysuria or cloudy urine. Musculoskeletal: Negative for musculoskeletal complaints Skin: Negative for skin complaints  Neurological: Negative for headache All other  ROS negative  ____________________________________________   PHYSICAL EXAM:  VITAL SIGNS: ED Triage Vitals [02/18/17 1827]  Enc Vitals Group     BP (!) 180/68     Pulse Rate 75     Resp 16     Temp 97.6 F (36.4 C)     Temp Source Oral     SpO2 98 %     Weight 155 lb (70.3 kg)     Height 5\' 5"  (1.651 m)     Head Circumference      Peak Flow      Pain Score      Pain Loc      Pain Edu?      Excl. in [place]?    Constitutional: Alert. Well appearing and in no distress.  Answers questions appropriately and follows commands. Eyes: Normal exam ENT   Head: Normocephalic and atraumatic.   Mouth/Throat: Mucous membranes are moist. Cardiovascular: Normal rate, regular rhythm. No murmur Respiratory: Normal respiratory effort without tachypnea nor retractions. Breath sounds are clear Gastrointestinal: Soft and nontender. No distention.  Musculoskeletal: Nontender with normal range of motion in all extremities. No lower extremity tenderness or edema. Neurologic:  Normal speech and language. No gross focal neurologic deficits Skin:  Skin is warm, dry and intact.  Psychiatric: Mood and affect are normal.   ____________________________________________   INITIAL IMPRESSION / ASSESSMENT AND PLAN / ED COURSE  Pertinent labs & imaging results that were available during my care of the patient were reviewed by me and considered in my medical decision making (see chart for details).  Patient presents to the emergency department for mild confusion over the past 2 days as well as generalized weakness.  Patient has no other complaints.  Largely negative review of systems.  Patient appears very well on physical exam.  Differential would include metabolic or electrolyte abnormality, renal insufficiency, infectious etiology.  Patient's blood work has resulted largely at baseline, patient's urinalysis has shown too numerous to count white blood cells with bacteria.  We will send a urine culture.   We will place the patient on antibiotics, IV Rocephin as well as IV fluids.  Once antibiotics and fluids have infused we will attempt to ambulate the patient to make a decision upon disposition.  Overall the patient appears very well currently.  Attempted to get the patient up to the restroom, nurse states the patient required 2 people to assist due to significant instability and weakness while walking.  Given the patient's unsteadiness with weakness and a significant urinary tract infection we will admit the patient to the hospitalist service for further treatment.  Patient agreeable to this plan of care.   EKG interpretation: EKG reviewed and interpreted by myself shows normal sinus rhythm at 72 bpm with a narrow QRS, normal axis, largely normal intervals besides slight QTC prolongation, nonspecific but no concerning ST changes.  ____________________________________________   FINAL CLINICAL IMPRESSION(S) / ED DIAGNOSES  Weakness Urinary tract infection  Harvest Dark, MD 02/18/17 4734    Harvest Dark, MD 03/01/17 [phone]

## 2017-02-18 NOTE — H&P (Addendum)
[facility] [place] NAME: Haley Brooks    MR#:  [id number]  DATE OF BIRTH:  1931-07-18  DATE OF ADMISSION:  02/18/2017  PRIMARY CARE PHYSICIAN: Glendon Axe, MD   REQUESTING/REFERRING PHYSICIAN:   CHIEF COMPLAINT:   Chief Complaint  Patient presents with  . Weakness    HISTORY OF PRESENT ILLNESS: Haley Brooks  is a 84 y.o. female with a known history of hypertension and remote colon cancer. Patient was brought to emergency room for confusion and generalized weakness going on for the past 2-3 days, gradually worsening.  Patient is not able to provide history due to her confusion.  Most of the information was taken from reviewing the medical records and from discussion with the daughter. Per patient's daughter, there was no fever or chills, no chest pain or shortness of breath, no cough, no nausea/vomiting/diarrhea no bleeding.  No sick contacts. Upon evaluation in the emergency room, UA is positive for acute UTI.  Lab results are essentially unremarkable.  Troponin level is slightly elevated at 1.2, but this seems to be the baseline for the patient. Patient is admitted for further evaluation and treatment.  PAST MEDICAL HISTORY:   Past Medical History:  Diagnosis Date  . Cancer [facility])    colon cancer   . Hemorrhoid   . Hypertension   . Thyroid disease     PAST SURGICAL HISTORY:  Past Surgical History:  Procedure Laterality Date  . COLON SURGERY    . FRACTURE SURGERY      SOCIAL HISTORY:  Social History   Tobacco Use  . Smoking status: Never Smoker  . Smokeless tobacco: Never Used  Substance Use Topics  . Alcohol use: No    FAMILY HISTORY:  Family History  Problem Relation Age of Onset  . Colon cancer Mother   . Diabetes Father   . Cancer Brother     DRUG ALLERGIES: No Known Allergies  REVIEW OF SYSTEMS:   Not able to obtain reliable answers, as patient is too confused.  MEDICATIONS AT HOME:   Prior to Admission medications   Medication Sig Start Date End Date Taking? Authorizing Provider  alendronate (FOSAMAX) 70 MG tablet Take 1 tablet by mouth 1 day or 1 dose. 03/25/15  Yes [provider]  amLODipine (NORVASC) 10 MG tablet Take 10 mg by mouth daily.    Yes [provider]  apixaban (ELIQUIS) 5 MG TABS tablet Take 5 mg by mouth 2 (two) times daily.   Yes [provider]  aspirin 325 MG EC tablet Take 325 mg by mouth daily.    Yes [provider]  atorvastatin (LIPITOR) 20 MG tablet Take 20 mg by mouth daily.   Yes [provider]  benazepril (LOTENSIN) 40 MG tablet Take 20 mg by mouth daily.   Yes [provider]  donepezil (ARICEPT) 10 MG tablet Take 1 tablet by mouth 2 (two) times daily. 12/21/16  Yes [provider]  ferrous sulfate 325 (65 FE) MG tablet Take 325 mg by mouth 2 (two) times daily with a meal.   Yes [provider]  levothyroxine (SYNTHROID, LEVOTHROID) 175 MCG tablet Take 175 mcg by mouth daily before breakfast.    Yes [provider]  metoprolol succinate (TOPROL-XL) 25 MG 24 hr tablet Take 25 mg by mouth daily.   Yes [provider]  cephALEXin (KEFLEX) 500 MG capsule Take 1 capsule (500 mg total) by mouth 2 (two) times daily.  Patient not taking: Reported on 02/18/2017 12/25/16   Demetrios Loll, MD  conjugated estrogens (PREMARIN) vaginal cream Place 1 Applicatorful vaginally 2 (two) times a week. Patient not taking: Reported on 12/24/2016 04/19/16   [name], MD  levocetirizine (XYZAL) 5 MG tablet Take 5 mg by mouth every evening.    [provider]  traMADol (ULTRAM) 50 MG tablet Take 1 tablet (50 mg total) by mouth every 6 (six) hours as needed. Patient not taking: Reported on 12/24/2016 08/23/15   Harvest Dark, PA-C      PHYSICAL EXAMINATION:   VITAL SIGNS: Blood pressure (!) 172/69, pulse (!) 56, temperature 97.6 F (36.4 C), temperature source  Oral, resp. rate 14, height 5\' 5"  (1.651 m), weight 70.3 kg (155 lb), SpO2 99 %.  GENERAL:  84 y.o.-year-old patient lying in the bed with no acute distress.  EYES: Pupils equal, round, reactive to light and accommodation. No scleral icterus. Extraocular muscles intact.  HEENT: Head atraumatic, normocephalic. Oropharynx and nasopharynx clear.  NECK:  Supple, no jugular venous distention. No thyroid enlargement, no tenderness.  LUNGS: Normal breath sounds bilaterally, no wheezing, rales,rhonchi or crepitation. No use of accessory muscles of respiration.  CARDIOVASCULAR: S1, S2 normal. No S3/S4.  ABDOMEN: Soft, nontender, nondistended. Bowel sounds present. No organomegaly or mass.  EXTREMITIES: No pedal edema, cyanosis, or clubbing.  NEUROLOGIC: No focal weakness. Gait not checked, as patient is too weak to ambulate.  PSYCHIATRIC: The patient is alert and oriented x 2.  SKIN: No obvious rash, lesion, or ulcer.   LABORATORY PANEL:   CBC Recent Labs  Lab 02/18/17 1828  WBC 5.9  HGB 11.2*  HCT 34.1*  PLT 258  MCV 88.5  MCH 29.0  MCHC 32.8  RDW 13.4   ------------------------------------------------------------------------------------------------------------------  Chemistries  Recent Labs  Lab 02/18/17 1828  NA 139  K 3.6  CL 102  CO2 29  GLUCOSE 147*  BUN 26*  CREATININE 1.12*  CALCIUM 9.0   ------------------------------------------------------------------------------------------------------------------ estimated creatinine clearance is 37.4 mL/min (A) (by C-G formula based on SCr of 1.12 mg/dL (H)). ------------------------------------------------------------------------------------------------------------------ No results for input(s): TSH, T4TOTAL, T3FREE, THYROIDAB in the last 72 hours.  Invalid input(s): FREET3   Coagulation profile No results for input(s): INR, PROTIME in the last 168  hours. ------------------------------------------------------------------------------------------------------------------- No results for input(s): DDIMER in the last 72 hours. -------------------------------------------------------------------------------------------------------------------  Cardiac Enzymes No results for input(s): CKMB, TROPONINI, MYOGLOBIN in the last 168 hours.  Invalid input(s): CK ------------------------------------------------------------------------------------------------------------------ Invalid input(s): POCBNP  ---------------------------------------------------------------------------------------------------------------  Urinalysis    Component Value Date/Time   COLORURINE YELLOW (A) 02/18/2017 1828   APPEARANCEUR HAZY (A) 02/18/2017 1828   APPEARANCEUR Hazy 01/27/2012 0502   LABSPEC 1.011 02/18/2017 1828   LABSPEC 1.013 01/27/2012 0502   PHURINE 6.0 02/18/2017 1828   GLUCOSEU NEGATIVE 02/18/2017 1828   GLUCOSEU Negative 01/27/2012 0502   HGBUR NEGATIVE 02/18/2017 1828   BILIRUBINUR NEGATIVE 02/18/2017 1828   BILIRUBINUR Negative 01/27/2012 0502   KETONESUR NEGATIVE 02/18/2017 1828   PROTEINUR 100 (A) 02/18/2017 1828   NITRITE NEGATIVE 02/18/2017 1828   LEUKOCYTESUR LARGE (A) 02/18/2017 1828   LEUKOCYTESUR 3+ 01/27/2012 0502     RADIOLOGY: No results found.  EKG: Orders placed or performed during the hospital encounter of 02/18/17  . ED EKG  . ED EKG  . EKG 12-Lead  . EKG 12-Lead    IMPRESSION AND PLAN:  1.  Acute metabolic encephalopathy, related to acute UTI.  Will start patient on IV ceftriaxone, while pending the final urine  culture result.  Start gentle IV hydration. 2.  Acute UTI, confirmed per UA; final urine culture result is pending.  Patient was started on IV ceftriaxone.  3.  CKD 3, stable creatinine is 1.2,  at baseline.  Continue to monitor kidney function closely and avoid nephrotoxic medications. 4.  Hypertension.  BP  is currently elevated likely secondary to IV fluids.  We will stop the IV fluids after he finished his back and restart home medications for blood pressure.  Continue to monitor closely.  All the records are reviewed and case discussed with ED provider. Management plans discussed with the patient, family and they are in agreement.  CODE STATUS: Code Status History    Date Active Date Inactive Code Status Order ID Comments User Context   12/24/2016 18:44 12/25/2016 19:32 Full Code [id number]  Demetrios Loll, MD Inpatient    Advance Directive Documentation     Most Recent Value  Type of Advance Directive  Healthcare Power of Attorney, Living will  Pre-existing out of facility DNR order (yellow form or pink MOST form)  No data  "MOST" Form in Place?  No data       TOTAL TIME TAKING CARE OF THIS PATIENT: 35 minutes.    Amelia Jo M.D on 02/18/2017 at 11:47 PM  Between 7am to 6pm - Pager - [phone]  After 6pm go to [email or web address] - password [email or web address] [facility]  [place] [facility]  Office  [phone]  CC: Primary care physician; Glendon Axe, MD

## 2017-02-18 NOTE — ED Triage Notes (Signed)
Pt is a/ox4 on arrival to triage. Pt states she was confused yesterday, didn't know what day it was and thinks she may have taken the wrong medication. States her daughter was concerned so she wanted her to come here to be checked out. Pt denies any pain or recent illness. States she just feels a like more tired then normal today.

## 2017-02-19 ENCOUNTER — Other Ambulatory Visit: Payer: Self-pay

## 2017-02-19 LAB — BASIC METABOLIC PANEL
Anion gap: 8 (ref 5–15)
BUN: 21 mg/dL — ABNORMAL HIGH (ref 6–20)
CHLORIDE: 108 mmol/L (ref 101–111)
CO2: 27 mmol/L (ref 22–32)
Calcium: 8.4 mg/dL — ABNORMAL LOW (ref 8.9–10.3)
Creatinine, Ser: 0.91 mg/dL (ref 0.44–1.00)
GFR calc Af Amer: >60 mL/min (ref >60)
GFR calc non Af Amer: 57 mL/min — ABNORMAL LOW (ref >60)
Glucose, Bld: 87 mg/dL (ref 65–99)
POTASSIUM: 3.4 mmol/L — AB (ref 3.5–5.1)
Sodium: 143 mmol/L (ref 135–145)

## 2017-02-19 LAB — CBC
HEMATOCRIT: 31.4 % — AB (ref 35.0–47.0)
HEMOGLOBIN: 10.2 g/dL — AB (ref 12.0–16.0)
MCH: 29.1 pg (ref 26.0–34.0)
MCHC: 32.5 g/dL (ref 32.0–36.0)
MCV: 89.7 fL (ref 80.0–100.0)
Platelets: 219 10*3/uL (ref 150–440)
RBC: 3.5 MIL/uL — ABNORMAL LOW (ref 3.80–5.20)
RDW: 13.4 % (ref 11.5–14.5)
WBC: 5.7 10*3/uL (ref 3.6–11.0)

## 2017-02-19 LAB — GLUCOSE, CAPILLARY: Glucose-Capillary: 75 mg/dL (ref 65–99)

## 2017-02-19 MED ORDER — DONEPEZIL HCL 5 MG PO TABS
10.0000 mg | ORAL_TABLET | Freq: Two times a day (BID) | ORAL | Status: DC
  Administered 2017-02-19 – 2017-02-22 (×7): 10 mg via ORAL
  Filled 2017-02-19 (×9): qty 2

## 2017-02-19 MED ORDER — SODIUM CHLORIDE 0.9 % IV SOLN
Freq: Once | INTRAVENOUS | Status: AC
  Administered 2017-02-19: 01:00:00 via INTRAVENOUS

## 2017-02-19 MED ORDER — ATORVASTATIN CALCIUM 20 MG PO TABS
20.0000 mg | ORAL_TABLET | Freq: Every day | ORAL | Status: DC
  Administered 2017-02-19 – 2017-02-22 (×4): 20 mg via ORAL
  Filled 2017-02-19 (×4): qty 1

## 2017-02-19 MED ORDER — BENAZEPRIL HCL 20 MG PO TABS
20.0000 mg | ORAL_TABLET | Freq: Every day | ORAL | Status: DC
  Administered 2017-02-19 – 2017-02-20 (×2): 20 mg via ORAL
  Filled 2017-02-19 (×2): qty 1

## 2017-02-19 MED ORDER — LEVOTHYROXINE SODIUM 50 MCG PO TABS
175.0000 ug | ORAL_TABLET | Freq: Every day | ORAL | Status: DC
  Administered 2017-02-19 – 2017-02-20 (×2): 175 ug via ORAL
  Filled 2017-02-19 (×2): qty 1

## 2017-02-19 MED ORDER — ASPIRIN EC 325 MG PO TBEC
325.0000 mg | DELAYED_RELEASE_TABLET | Freq: Every day | ORAL | Status: DC
  Administered 2017-02-19 – 2017-02-22 (×4): 325 mg via ORAL
  Filled 2017-02-19 (×4): qty 1

## 2017-02-19 MED ORDER — LEVOCETIRIZINE DIHYDROCHLORIDE 5 MG PO TABS: 5.0000 mg | ORAL_TABLET | Freq: Every evening | ORAL | Status: DC

## 2017-02-19 MED ORDER — CEFTRIAXONE SODIUM 1 G IJ SOLR
1.0000 g | INTRAMUSCULAR | Status: DC
  Administered 2017-02-19 – 2017-02-21 (×2): 1 g via INTRAVENOUS
  Filled 2017-02-19 (×4): qty 10

## 2017-02-19 MED ORDER — SODIUM CHLORIDE 0.9% FLUSH
3.0000 mL | Freq: Three times a day (TID) | INTRAVENOUS | Status: DC
  Administered 2017-02-19 – 2017-02-22 (×6): 3 mL via INTRAVENOUS

## 2017-02-19 MED ORDER — FERROUS SULFATE 325 (65 FE) MG PO TABS
325.0000 mg | ORAL_TABLET | Freq: Two times a day (BID) | ORAL | Status: DC
  Administered 2017-02-19 – 2017-02-22 (×6): 325 mg via ORAL
  Filled 2017-02-19 (×6): qty 1

## 2017-02-19 MED ORDER — BISACODYL 5 MG PO TBEC: 5.0000 mg | DELAYED_RELEASE_TABLET | Freq: Every day | ORAL | Status: DC | PRN

## 2017-02-19 MED ORDER — ONDANSETRON HCL 4 MG PO TABS: 4.0000 mg | ORAL_TABLET | Freq: Four times a day (QID) | ORAL | Status: DC | PRN

## 2017-02-19 MED ORDER — ACETAMINOPHEN 650 MG RE SUPP: 650.0000 mg | Freq: Four times a day (QID) | RECTAL | Status: DC | PRN

## 2017-02-19 MED ORDER — CETIRIZINE HCL 10 MG PO TABS
10.0000 mg | ORAL_TABLET | Freq: Every day | ORAL | Status: DC
  Administered 2017-02-19 – 2017-02-21 (×2): 10 mg via ORAL
  Filled 2017-02-19 (×4): qty 1

## 2017-02-19 MED ORDER — DOCUSATE SODIUM 100 MG PO CAPS
100.0000 mg | ORAL_CAPSULE | Freq: Two times a day (BID) | ORAL | Status: DC
  Administered 2017-02-19 – 2017-02-22 (×7): 100 mg via ORAL
  Filled 2017-02-19 (×7): qty 1

## 2017-02-19 MED ORDER — HYDROCODONE-ACETAMINOPHEN 5-325 MG PO TABS: 1.0000 | ORAL_TABLET | ORAL | Status: DC | PRN

## 2017-02-19 MED ORDER — HYDRALAZINE HCL 20 MG/ML IJ SOLN
10.0000 mg | Freq: Four times a day (QID) | INTRAMUSCULAR | Status: DC | PRN
  Administered 2017-02-19 – 2017-02-20 (×2): 10 mg via INTRAVENOUS
  Filled 2017-02-19 (×2): qty 1

## 2017-02-19 MED ORDER — ONDANSETRON HCL 4 MG/2ML IJ SOLN: 4.0000 mg | Freq: Four times a day (QID) | INTRAMUSCULAR | Status: DC | PRN

## 2017-02-19 MED ORDER — APIXABAN 5 MG PO TABS
5.0000 mg | ORAL_TABLET | Freq: Two times a day (BID) | ORAL | Status: DC
  Administered 2017-02-19 – 2017-02-22 (×7): 5 mg via ORAL
  Filled 2017-02-19 (×7): qty 1

## 2017-02-19 MED ORDER — AMLODIPINE BESYLATE 10 MG PO TABS
10.0000 mg | ORAL_TABLET | Freq: Every day | ORAL | Status: DC
  Administered 2017-02-19 – 2017-02-22 (×4): 10 mg via ORAL
  Filled 2017-02-19 (×4): qty 1

## 2017-02-19 MED ORDER — METOPROLOL SUCCINATE ER 25 MG PO TB24
25.0000 mg | ORAL_TABLET | Freq: Every day | ORAL | Status: DC
  Administered 2017-02-19 – 2017-02-20 (×2): 25 mg via ORAL
  Filled 2017-02-19 (×2): qty 1

## 2017-02-19 MED ORDER — TRAZODONE HCL 50 MG PO TABS
25.0000 mg | ORAL_TABLET | Freq: Every evening | ORAL | Status: DC | PRN
Start: 2017-02-19 — End: 2017-02-22

## 2017-02-19 MED ORDER — ALENDRONATE SODIUM 70 MG PO TABS: 70.0000 mg | ORAL_TABLET | ORAL | Status: DC

## 2017-02-19 MED ORDER — ACETAMINOPHEN 325 MG PO TABS: 650.0000 mg | ORAL_TABLET | Freq: Four times a day (QID) | ORAL | Status: DC | PRN

## 2017-02-19 NOTE — Progress Notes (Addendum)
[facility] on-call repaged [phone]); awaiting callback. Ivanka Kirshner K, RN2:15 AM 02/19/2017

## 2017-02-19 NOTE — Progress Notes (Signed)
[facility] [place] NAME: Haley Brooks    MR#:  [id number]  DATE OF BIRTH:  1931/04/07  SUBJECTIVE:  CHIEF COMPLAINT:   Chief Complaint  Patient presents with  . Weakness    Came with confusion and generalized weakness, noted to have UTI. With IV antibiotics started patient feels better today and oriented. Able to tolerate diet. Have generalized weakness.  REVIEW OF SYSTEMS:  CONSTITUTIONAL: No fever, fatigue or weakness.  EYES: No blurred or double vision.  EARS, NOSE, AND THROAT: No tinnitus or ear pain.  RESPIRATORY: No cough, shortness of breath, wheezing or hemoptysis.  CARDIOVASCULAR: No chest pain, orthopnea, edema.  GASTROINTESTINAL: No nausea, vomiting, diarrhea or abdominal pain.  GENITOURINARY: No dysuria, hematuria.  ENDOCRINE: No polyuria, nocturia,  HEMATOLOGY: No anemia, easy bruising or bleeding SKIN: No rash or lesion. MUSCULOSKELETAL: No joint pain or arthritis.   NEUROLOGIC: No tingling, numbness, weakness.  PSYCHIATRY: No anxiety or depression.   ROS  DRUG ALLERGIES:  No Known Allergies  VITALS:  Blood pressure (!) 178/56, pulse 60, temperature 97.9 F (36.6 C), temperature source Oral, resp. rate 16, height 5\' 4"  (1.626 m), weight 62.8 kg (138 lb 7.2 oz), SpO2 96 %.  PHYSICAL EXAMINATION:  GENERAL:  84 y.o.-year-old patient lying in the bed with no acute distress.  EYES: Pupils equal, round, reactive to light and accommodation. No scleral icterus. Extraocular muscles intact.  HEENT: Head atraumatic, normocephalic. Oropharynx and nasopharynx clear.  NECK:  Supple, no jugular venous distention. No thyroid enlargement, no tenderness.  LUNGS: Normal breath sounds bilaterally, no wheezing, rales,rhonchi or crepitation. No use of accessory muscles of respiration.  CARDIOVASCULAR: S1, S2 normal. No murmurs, rubs, or gallops.  ABDOMEN: Soft, nontender, nondistended. Bowel sounds present. No organomegaly or mass.   EXTREMITIES: No pedal edema, cyanosis, or clubbing.  NEUROLOGIC: Cranial nerves II through XII are intact. Muscle strength 4/5 in all extremities. Sensation intact. Gait not checked.  PSYCHIATRIC: The patient is alert and oriented x 3.  SKIN: No obvious rash, lesion, or ulcer.   Physical Exam LABORATORY PANEL:   CBC Recent Labs  Lab 02/19/17 0354  WBC 5.7  HGB 10.2*  HCT 31.4*  PLT 219   ------------------------------------------------------------------------------------------------------------------  Chemistries  Recent Labs  Lab 02/19/17 0354  NA 143  K 3.4*  CL 108  CO2 27  GLUCOSE 87  BUN 21*  CREATININE 0.91  CALCIUM 8.4*   ------------------------------------------------------------------------------------------------------------------  Cardiac Enzymes No results for input(s): TROPONINI in the last 168 hours. ------------------------------------------------------------------------------------------------------------------  RADIOLOGY:  No results found.  ASSESSMENT AND PLAN:   Active Problems:   Acute encephalopathy  1.  Acute metabolic encephalopathy, related to acute UTI.     on IV ceftriaxone, while pending the final urine culture result.  Start gentle IV hydration. 2.  Acute UTI, confirmed per UA; final urine culture result is pending.  Patient was started on IV ceftriaxone.  3.  CKD 2, stable creatinine is 1,  at baseline.  Continue to monitor kidney function closely and avoid nephrotoxic medications. 4.  Hypertension.  BP is currently elevated likely secondary to IV fluids.    restart home medications for blood pressure.  Continue to monitor closely.    PT evaluation for generalized weakness.   All the records are reviewed and case discussed with Care Management/Social Workerr. Management plans discussed with the patient, family and they are in agreement.  CODE STATUS: Full.  TOTAL TIME TAKING CARE OF THIS PATIENT: 35 minutes.  POSSIBLE D/C IN 1-2 DAYS, DEPENDING ON CLINICAL CONDITION.   Vaughan Basta M.D on 02/19/2017   Between 7am to 6pm - Pager - [phone]  After 6pm go to [email or web address] - password [email or web address] [place] [facility]  Office  [phone]  CC: Primary care physician; Glendon Axe, MD  Note: This dictation was prepared with Dragon dictation along with smaller phrase technology. Any transcriptional errors that result from this process are unintentional.

## 2017-02-19 NOTE — Progress Notes (Signed)
Dr. Jerelyn Charles called back, deferred to Dr. Duane Boston; Dr. Duane Boston called on ascom phone [phone]; notified of elevated BP, trending down; acknowledged; new order written. Barbaraann Faster, RN 3:11 AM 02/19/2017

## 2017-02-19 NOTE — Evaluation (Signed)
Physical Therapy Evaluation Patient Details Name: Haley Brooks MRN: [id number] DOB: [birth year] Today's Date: 02/19/2017   History of Present Illness  84 y/o female here with UTI, AMS.  Is mentally feeling back to baseline at time of PT exam, still somewhat weak.  Clinical Impression  Pt did well with circumambulating the nurses' station, she indicated that she can regularly walk w/o AD safely, today during prolonged ambulation she had need for significant use of hand held assist from PT t/o the entirety of the effort and with the few attempts to walk w/o UE support she was not at all confident.  Overall with AD she will be safe to go home but is not at her baseline and would benefit from HHPT once she returns home.     Follow Up Recommendations Home health PT    Equipment Recommendations       Recommendations for Other Services       Precautions / Restrictions Precautions Precautions: Fall Restrictions Weight Bearing Restrictions: No      Mobility  Bed Mobility Overal bed mobility: Independent                Transfers Overall transfer level: Independent Equipment used: 1 person hand held assist             General transfer comment: Pt wanting light assist (simulating SPC) for steadiness on standing, reports she does not always need an AD in the home  Ambulation/Gait Ambulation/Gait assistance: Supervision Ambulation Distance (Feet): 200 Feet Assistive device: 1 person hand held assist       General Gait Details: Pt able to ambulate with consistent cadence, though she was reliant on HHA from PT (simulating SPC).  Stairs            Wheelchair Mobility    Modified Rankin (Stroke Patients Only)       Balance                                             Pertinent Vitals/Pain Pain Assessment: No/denies pain    Home Living Family/patient expects to be discharged to:: Private residence Living Arrangements:  Alone Available Help at Discharge: Family Type of Home: House Home Access: Stairs to enter Entrance Stairs-Rails: Can reach both Entrance Stairs-Number of Steps: [place]: Environmental consultant - 2 wheels;Cane - single point      Prior Function Level of Independence: Independent with assistive device(s)         Comments: Pt runs all her errands, drives, etc     Hand Dominance        Extremity/Trunk Assessment   Upper Extremity Assessment Upper Extremity Assessment: Generalized weakness    Lower Extremity Assessment Lower Extremity Assessment: Generalized weakness       Communication   Communication: No difficulties  Cognition Arousal/Alertness: Awake/alert Behavior During Therapy: WFL for tasks assessed/performed Overall Cognitive Status: Within Functional Limits for tasks assessed                                        General Comments      Exercises     Assessment/Plan    PT Assessment Patient needs continued PT services  PT Problem List Decreased strength;Decreased range of motion;Decreased activity tolerance;Decreased balance;Decreased mobility;Decreased coordination;Decreased knowledge of  use of DME;Decreased safety awareness       PT Treatment Interventions Gait training;DME instruction;Stair training;Functional mobility training;Therapeutic activities;Therapeutic exercise;Balance training;Neuromuscular re-education;Patient/family education    PT Goals (Current goals can be found in the Care Plan section)  Acute Rehab PT Goals Patient Stated Goal: go home tomorrow PT Goal Formulation: With patient Time For Goal Achievement: 03/05/17 Potential to Achieve Goals: Good    Frequency Min 2X/week   Barriers to discharge        Co-evaluation               AM-PAC PT "6 Clicks" Daily Activity  Outcome Measure Difficulty turning over in bed (including adjusting bedclothes, sheets and blankets)?: None Difficulty moving from lying  on back to sitting on the side of the bed? : None Difficulty sitting down on and standing up from a chair with arms (e.g., wheelchair, bedside commode, etc,.)?: None Help needed moving to and from a bed to chair (including a wheelchair)?: None Help needed walking in hospital room?: None Help needed climbing 3-5 steps with a railing? : None 6 Click Score: 24    End of Session Equipment Utilized During Treatment: Gait belt Activity Tolerance: Patient tolerated treatment well Patient left: with call bell/phone within reach;with bed alarm set   PT Visit Diagnosis: Muscle weakness (generalized) (M62.81);Difficulty in walking, not elsewhere classified (R26.2)    Time: 9629-5284 PT Time Calculation (min) (ACUTE ONLY): 27 min   Charges:   PT Evaluation $PT Eval Low Complexity: 1 Low     PT G Codes:        Kreg Shropshire, DPT 02/19/2017, 5:18 PM

## 2017-02-19 NOTE — Progress Notes (Addendum)
BP trending down; [facility] repaged [phone], awaiting callback. Barbaraann Faster, RN 3:03 AM 02/19/2017

## 2017-02-19 NOTE — Plan of Care (Signed)
Pt ambulating to [facility]. Worked with PT

## 2017-02-19 NOTE — Progress Notes (Addendum)
Dr. Duane Boston (on-call [facility] [phone]) paged for hypertension and no PRN medication; patient stated that she had taken all of her am meds, which included Norvasc,Lotensin and Toprol XL; awaiting called back. Barbaraann Faster, RN 1:47 AM 02/19/2017

## 2017-02-20 ENCOUNTER — Inpatient Hospital Stay: Payer: Medicare Other

## 2017-02-20 LAB — BASIC METABOLIC PANEL
Anion gap: 11 (ref 5–15)
BUN: 20 mg/dL (ref 6–20)
CALCIUM: 8.9 mg/dL (ref 8.9–10.3)
CO2: 24 mmol/L (ref 22–32)
CREATININE: 1.09 mg/dL — AB (ref 0.44–1.00)
Chloride: 102 mmol/L (ref 101–111)
GFR calc Af Amer: 53 mL/min — ABNORMAL LOW (ref >60)
GFR calc non Af Amer: 46 mL/min — ABNORMAL LOW (ref >60)
GLUCOSE: 141 mg/dL — AB (ref 65–99)
Potassium: 3.7 mmol/L (ref 3.5–5.1)
Sodium: 137 mmol/L (ref 135–145)

## 2017-02-20 LAB — URINE CULTURE

## 2017-02-20 LAB — GLUCOSE, CAPILLARY: Glucose-Capillary: 122 mg/dL — ABNORMAL HIGH (ref 65–99)

## 2017-02-20 LAB — CBC
HEMATOCRIT: 38.5 % (ref 35.0–47.0)
Hemoglobin: 12.5 g/dL (ref 12.0–16.0)
MCH: 28.8 pg (ref 26.0–34.0)
MCHC: 32.5 g/dL (ref 32.0–36.0)
MCV: 88.4 fL (ref 80.0–100.0)
Platelets: 276 10*3/uL (ref 150–440)
RBC: 4.35 MIL/uL (ref 3.80–5.20)
RDW: 13.4 % (ref 11.5–14.5)
WBC: 8.8 10*3/uL (ref 3.6–11.0)

## 2017-02-20 LAB — TSH: TSH: 5.403 u[IU]/mL — ABNORMAL HIGH (ref 0.350–4.500)

## 2017-02-20 MED ORDER — BENAZEPRIL HCL 20 MG PO TABS
20.0000 mg | ORAL_TABLET | Freq: Every day | ORAL | Status: DC
  Administered 2017-02-21: 20 mg via ORAL
  Filled 2017-02-20 (×2): qty 1

## 2017-02-20 MED ORDER — LORAZEPAM 2 MG/ML IJ SOLN
1.0000 mg | Freq: Once | INTRAMUSCULAR | Status: AC
Start: 2017-02-20 — End: 2017-02-20
  Administered 2017-02-20: 1 mg via INTRAMUSCULAR
  Filled 2017-02-20: qty 1

## 2017-02-20 MED ORDER — METOPROLOL SUCCINATE ER 25 MG PO TB24: 25.0000 mg | ORAL_TABLET | Freq: Once | ORAL | Status: DC

## 2017-02-20 MED ORDER — BENAZEPRIL HCL 40 MG PO TABS
40.0000 mg | ORAL_TABLET | Freq: Every day | ORAL | Status: DC
  Administered 2017-02-21 – 2017-02-22 (×2): 40 mg via ORAL
  Filled 2017-02-20 (×2): qty 1

## 2017-02-20 MED ORDER — POTASSIUM CHLORIDE CRYS ER 20 MEQ PO TBCR
20.0000 meq | EXTENDED_RELEASE_TABLET | Freq: Once | ORAL | Status: DC
  Filled 2017-02-20: qty 1

## 2017-02-20 MED ORDER — METOPROLOL SUCCINATE ER 50 MG PO TB24
50.0000 mg | ORAL_TABLET | Freq: Every day | ORAL | Status: DC
  Administered 2017-02-21 – 2017-02-22 (×2): 50 mg via ORAL
  Filled 2017-02-20 (×2): qty 1

## 2017-02-20 MED ORDER — LORAZEPAM 2 MG/ML IJ SOLN
1.0000 mg | INTRAMUSCULAR | Status: DC | PRN
  Administered 2017-02-20 (×2): 1 mg via INTRAVENOUS
  Filled 2017-02-20 (×2): qty 1

## 2017-02-20 MED ORDER — LEVOTHYROXINE SODIUM 100 MCG PO TABS
200.0000 ug | ORAL_TABLET | Freq: Every day | ORAL | Status: DC
  Administered 2017-02-21 – 2017-02-22 (×2): 200 ug via ORAL
  Filled 2017-02-20 (×2): qty 2

## 2017-02-20 NOTE — Progress Notes (Signed)
Pt has been very confused today. Discussed with dr. Anselm Jungling during rounding. Will continue to monitor and reorient patient

## 2017-02-20 NOTE — Progress Notes (Signed)
Pt has been irritable. Explained to pt I needed to flush IV and she said ok. Pt screamed when I connected flush and said I was trying to hurt her. Refuses meds at this time

## 2017-02-20 NOTE — Progress Notes (Signed)
[place] at [place] NAME: Haley Brooks    MR#:  [id number]  DATE OF BIRTH:  [birth year]  SUBJECTIVE:  CHIEF COMPLAINT:   Chief Complaint  Patient presents with  . Weakness    Came with confusion and generalized weakness, noted to have UTI.  Yesterday patient was completely alert and oriented and tolerating diet but had generalized weakness. Today she is very confused, completely disoriented with her surroundings, has delirious thoughts of having all the nursing staff trying to harm or kill her, and so she is not allowing them to give any medications and she is not eating either.  REVIEW OF SYSTEMS:  Patient is very confused and cannot give review of system. ROS  DRUG ALLERGIES:  No Known Allergies  VITALS:  Blood pressure (!) 171/67, pulse 88, temperature 98 F (36.7 C), temperature source Oral, resp. rate 18, height 5\' 4"  (1.626 m), weight 62.8 kg (138 lb 7.2 oz), SpO2 96 %.  PHYSICAL EXAMINATION:  GENERAL:  84 y.o.-year-old patient lying in the bed with no acute distress.  EYES: Pupils equal, round, reactive to light and accommodation. No scleral icterus. Extraocular muscles intact.  HEENT: Head atraumatic, normocephalic. Oropharynx and nasopharynx clear.  NECK:  Supple, no jugular venous distention. No thyroid enlargement, no tenderness.  LUNGS: Normal breath sounds bilaterally, no wheezing, rales,rhonchi or crepitation. No use of accessory muscles of respiration.  CARDIOVASCULAR: S1, S2 normal. No murmurs, rubs, or gallops.  ABDOMEN: Soft, nontender, nondistended. Bowel sounds present. No organomegaly or mass.  EXTREMITIES: No pedal edema, cyanosis, or clubbing.  NEUROLOGIC: Cranial nerves II through XII are intact. Muscle strength 4/5 in all extremities. Sensation intact. Gait not checked.  PSYCHIATRIC: The patient is alert and oriented x 0, delirious and thoughts of hallucination.  SKIN: No obvious rash, lesion, or ulcer.    Physical Exam LABORATORY PANEL:   CBC Recent Labs  Lab 02/19/17 0354  WBC 5.7  HGB 10.2*  HCT 31.4*  PLT 219   ------------------------------------------------------------------------------------------------------------------  Chemistries  Recent Labs  Lab 02/19/17 0354  NA 143  K 3.4*  CL 108  CO2 27  GLUCOSE 87  BUN 21*  CREATININE 0.91  CALCIUM 8.4*   ------------------------------------------------------------------------------------------------------------------  Cardiac Enzymes No results for input(s): TROPONINI in the last 168 hours. ------------------------------------------------------------------------------------------------------------------  RADIOLOGY:  No results found.  ASSESSMENT AND PLAN:   Active Problems:   Acute encephalopathy  1.  Acute metabolic encephalopathy, related to acute UTI.   also have delirium today.      on IV ceftriaxone, while pending the final urine culture result.  Start gentle IV hydration.   Has worsening mental status again today, I will like to rule out a stroke.    Will give antianxiety medications and try to get a CT scan of the head if she cooperates.    I will also collect CBC and BMP today if she allows later on.    I spoke to her daughter and updated her about this new findings.   2.  Acute UTI, confirmed per UA; final urine culture result is pending.  Patient was started on IV ceftriaxone.  3.  CKD 2, stable creatinine is 1,  at baseline.  Continue to monitor kidney function closely and avoid nephrotoxic medications. 4.  Hypertension.  BP is currently elevated likely secondary to IV fluids.    restart home medications for blood pressure.  Continue to monitor closely.  As blood pressure continues to be high, I have  increased her metoprolol and benazepril.   5. Hypothyroidism    TSH is high, increase the dose of levothyroxine.   PT evaluation for generalized weakness.   All the records are reviewed and case  discussed with Care Management/Social Workerr. Management plans discussed with the patient, family and they are in agreement.  CODE STATUS: Full.  TOTAL TIME TAKING CARE OF THIS PATIENT: 35 minutes.     POSSIBLE D/C IN 1-2 DAYS, DEPENDING ON CLINICAL CONDITION.   Vaughan Basta M.D on 02/20/2017   Between 7am to 6pm - Pager - [phone]  After 6pm go to [email or web address] - password [email or web address] [place] [facility]  Office  [phone]  CC: Primary care physician; Glendon Axe, MD  Note: This dictation was prepared with Dragon dictation along with smaller phrase technology. Any transcriptional errors that result from this process are unintentional.

## 2017-02-20 NOTE — Progress Notes (Signed)
Pt continues to be aggressive and believes all the staff are trying to hurt her. Notified dr. Molli Hazard due to not being able to get vitals or give meds. Orders were placed for IV and IM ativan.

## 2017-02-21 DIAGNOSIS — R410 Disorientation, unspecified: Secondary | ICD-10-CM

## 2017-02-21 DIAGNOSIS — F0280 Dementia in other diseases classified elsewhere without behavioral disturbance: Secondary | ICD-10-CM

## 2017-02-21 DIAGNOSIS — G309 Alzheimer's disease, unspecified: Secondary | ICD-10-CM

## 2017-02-21 LAB — BASIC METABOLIC PANEL
Anion gap: 10 (ref 5–15)
BUN: 24 mg/dL — AB (ref 6–20)
CHLORIDE: 102 mmol/L (ref 101–111)
CO2: 27 mmol/L (ref 22–32)
Calcium: 8.8 mg/dL — ABNORMAL LOW (ref 8.9–10.3)
Creatinine, Ser: 1.28 mg/dL — ABNORMAL HIGH (ref 0.44–1.00)
GFR calc Af Amer: 44 mL/min — ABNORMAL LOW (ref >60)
GFR calc non Af Amer: 38 mL/min — ABNORMAL LOW (ref >60)
Glucose, Bld: 95 mg/dL (ref 65–99)
POTASSIUM: 3.6 mmol/L (ref 3.5–5.1)
Sodium: 139 mmol/L (ref 135–145)

## 2017-02-21 LAB — GLUCOSE, CAPILLARY: Glucose-Capillary: 86 mg/dL (ref 65–99)

## 2017-02-21 MED ORDER — SENNA 8.6 MG PO TABS
2.0000 | ORAL_TABLET | Freq: Every day | ORAL | Status: DC
  Administered 2017-02-21: 17.2 mg via ORAL
  Filled 2017-02-21: qty 2

## 2017-02-21 MED ORDER — LEVOTHYROXINE SODIUM 200 MCG PO TABS: 200.0000 ug | ORAL_TABLET | Freq: Every day | ORAL | 0 refills | Status: DC

## 2017-02-21 MED ORDER — CEPHALEXIN 500 MG PO CAPS: 500.0000 mg | ORAL_CAPSULE | Freq: Two times a day (BID) | ORAL | 0 refills | Status: AC

## 2017-02-21 MED ORDER — BENAZEPRIL HCL 40 MG PO TABS: 40.0000 mg | ORAL_TABLET | Freq: Every day | ORAL | 0 refills | Status: DC

## 2017-02-21 NOTE — Progress Notes (Signed)
Physical Therapy Treatment Patient Details Name: Haley Brooks MRN: [id number] DOB: [birth year] Today's Date: 02/21/2017    History of Present Illness 84 y/o female here with UTI, AMS.  Is mentally feeling back to baseline at time of PT exam, still somewhat weak.    PT Comments    Pt resting in bed, ready to get up and eat breakfast.  Slow but able to get to edge of bed with rail and encouragement.  Kasandra Knudsen brought to trail per recommendation of last PT session.  She attempted to stand x 2 with cane but fell backwards onto bed.  Obtained walker where balance increased but continued to need min guard/min +1 to ambulate around nursing unit.  She leans to right at times.  Reports and noted weakness in BLE and general fatigue upon return to room.  While pt was cooperative and was not combative during session, cognition deficits remain.  She was unable to recall that she is in the hospital and asks frequently "Is this real life?" and "Are you real?"  Attempts to re-orient patient during session and encouragement given.  Will continue with HHPT recommendations at this time but pt will need 24 hour assist in order to be successful at home.  May need to consider SNF if confusion does not clear and with it mobility to return to a safe level for home.  She is unsafe to walk without assist at this time. Eval noted that family will be able to assist.      Follow Up Recommendations  Home health PT;Supervision/Assistance - 24 hour;Supervision for mobility/OOB     Equipment Recommendations  Rolling walker with 5" wheels    Recommendations for Other Services       Precautions / Restrictions Precautions Precautions: Fall Restrictions Weight Bearing Restrictions: No    Mobility  Bed Mobility Overal bed mobility: Modified Independent             General bed mobility comments: increased time and use of rails  Transfers Overall transfer level: Needs assistance Equipment used: Rolling  walker (2 wheeled) Transfers: Sit to/from Stand Sit to Stand: Min assist            Ambulation/Gait Ambulation/Gait assistance: Min guard;Min assist   Assistive device: Rolling walker (2 wheeled) Gait Pattern/deviations: Step-through pattern;Staggering right   Gait velocity interpretation: Below normal speed for age/gender     Stairs            Wheelchair Mobility    Modified Rankin (Stroke Patients Only)       Balance Overall balance assessment: Needs assistance Sitting-balance support: Feet supported Sitting balance-Leahy Scale: Fair     Standing balance support: Bilateral upper extremity supported Standing balance-Leahy Scale: Poor                              Cognition Arousal/Alertness: Awake/alert Behavior During Therapy: WFL for tasks assessed/performed Overall Cognitive Status: Impaired/Different from baseline Area of Impairment: Orientation;Awareness;Safety/judgement;Problem solving                 Orientation Level: Disoriented to;Place;Situation       Safety/Judgement: Decreased awareness of safety;Decreased awareness of deficits     General Comments: "Is this real life?"      Exercises      General Comments        Pertinent Vitals/Pain Pain Assessment: No/denies pain    Home Living  Prior Function            PT Goals (current goals can now be found in the care plan section) Progress towards PT goals: Progressing toward goals    Frequency    Min 2X/week      PT Plan Current plan remains appropriate    Co-evaluation              AM-PAC PT "6 Clicks" Daily Activity  Outcome Measure  Difficulty turning over in bed (including adjusting bedclothes, sheets and blankets)?: None Difficulty moving from lying on back to sitting on the side of the bed? : A Little Difficulty sitting down on and standing up from a chair with arms (e.g., wheelchair, bedside commode,  etc,.)?: Unable Help needed moving to and from a bed to chair (including a wheelchair)?: A Little Help needed walking in hospital room?: A Little Help needed climbing 3-5 steps with a railing? : A Little 6 Click Score: 17    End of Session Equipment Utilized During Treatment: Gait belt Activity Tolerance: Patient tolerated treatment well Patient left: with call bell/phone within reach;in chair;with chair alarm set         Time: [phone] PT Time Calculation (min) (ACUTE ONLY): 23 min  Charges:  $Gait Training: 8-22 mins $Therapeutic Activity: 8-22 mins                    G Codes:       Chesley Noon, PTA 02/21/17, 12:09 PM

## 2017-02-21 NOTE — Care Management (Signed)
Patient admitted for metabolic encephalopathy.  Patient lives at home alone.  Due to confusion RNCM has called and left message with daughter to complete assessment and arrange home health.  Awaiting return call.  PCP Candiss Norse.  PT has assessed patient and recommends home health PT.  Patient has RW and cane in the home.  Will follow up with daughter when she calls back

## 2017-02-21 NOTE — Progress Notes (Signed)
Called daughter 3 times and call went straight to voice mail. Waiting for her to call back.
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
## 2017-02-21 NOTE — Progress Notes (Signed)
Notified Dr. Anselm Jungling pt's daughter did not came to pick up pt. Per MD okay for pt to stay over night. Also spoke with daughter over the phone and she stated her "my mom is not in her right mind to come home". Also discuss with pt's daughter that pt has been clear for discharge by her MD. Daughter stated multiples times over the phone she was not going to take her mom home.

## 2017-02-21 NOTE — Care Management (Signed)
Patient admitted with acute encephalopathy.  Patient lives at home alone.  PCP Candiss Norse.  PT has assessed patient and recommends home health PT.  Patient has RW and cane in the home.  Daughter states that at baseline patient is oriented and still drives. Daughter expresses concerns that her mothers mental status is still not at baseline.  MD aware, Psych consult ordered.  Daughter agreeable to home health services.  States that she does not have a preference of agency.  Heads up referral made to [facility] with [facility].

## 2017-02-21 NOTE — Progress Notes (Signed)
Patient is ready to be discharged and waiting for her daughter to come and pick her up.

## 2017-02-21 NOTE — Care Management Important Message (Signed)
Important Message  Patient Details  Name: MARJORIE DEPREY MRN: [id number] Date of Birth: October 23, 1931   Medicare Important Message Given:  Yes    Beverly Sessions, RN 02/21/2017, 3:17 PM

## 2017-02-22 LAB — GLUCOSE, CAPILLARY: Glucose-Capillary: 75 mg/dL (ref 65–99)

## 2017-02-22 NOTE — Discharge Summary (Signed)
[facility] [place] NAME: Haley Brooks    MR#:  [id number]  DATE OF BIRTH:  [birth year]  DATE OF ADMISSION:  02/18/2017 ADMITTING PHYSICIAN: Amelia Jo, MD  DATE OF DISCHARGE: 02/22/2017  1:21 PM  PRIMARY CARE PHYSICIAN: Glendon Axe, MD    ADMISSION DIAGNOSIS:  Lower urinary tract infectious disease [N39.0] Weakness [R53.1]  DISCHARGE DIAGNOSIS:  Principal Problem:   Acute delirium Active Problems:   Acute encephalopathy   Alzheimer's dementia   SECONDARY DIAGNOSIS:   Past Medical History:  Diagnosis Date  . Cancer [facility])    colon cancer   . Hemorrhoid   . Hypertension   . Thyroid disease     HOSPITAL COURSE:   1.Acute metabolic encephalopathy, related to acute UTI. Had delirium yesterday.      on IV ceftriaxone,multiple species per final urine culture result. gentle IV hydration.   With worsening mental status- CT head was done and negative.   Pt much improved now, and likely that was delirium due to infection.  Stable today , and daughter feels she is much improved.  2.Acute UTI,confirmed per UA;final urine culture result is pending.Patient was started on IV ceftriaxone.  3.CKD 2,stable creatinine is 1,at baseline.Continue to monitor kidney function closely and avoid nephrotoxic medications. 4.Hypertension.BP is currently elevated likely secondary to IV fluids.  restart home medications for blood pressure.Continue to monitor closely.  As blood pressure continues to be high, I have increased her metoprolol and benazepril.   5. Hypothyroidism    TSH is high, increase the dose of levothyroxine.   PT evaluation for generalized weakness.  Suggest HHAPT on discharge.    DISCHARGE CONDITIONS:   Stable.  CONSULTS OBTAINED:  Treatment Team:  Gonzella Lex, MD  DRUG ALLERGIES:  No Known Allergies  DISCHARGE MEDICATIONS:   Allergies as of 02/22/2017   No Known  Allergies     Medication List    STOP taking these medications   conjugated estrogens vaginal cream Commonly known as:  PREMARIN   levocetirizine 5 MG tablet Commonly known as:  XYZAL   traMADol 50 MG tablet Commonly known as:  ULTRAM     TAKE these medications   alendronate 70 MG tablet Commonly known as:  FOSAMAX Take 1 tablet by mouth 1 day or 1 dose.   amLODipine 10 MG tablet Commonly known as:  NORVASC Take 10 mg by mouth daily.   aspirin 325 MG EC tablet Take 325 mg by mouth daily.   atorvastatin 20 MG tablet Commonly known as:  LIPITOR Take 20 mg by mouth daily.   benazepril 40 MG tablet Commonly known as:  LOTENSIN Take 1 tablet (40 mg total) by mouth daily. What changed:  how much to take   cephALEXin 500 MG capsule Commonly known as:  KEFLEX Take 1 capsule (500 mg total) by mouth 2 (two) times daily for 4 days.   donepezil 10 MG tablet Commonly known as:  ARICEPT Take 1 tablet by mouth 2 (two) times daily.   ELIQUIS 5 MG Tabs tablet Generic drug:  apixaban Take 5 mg by mouth 2 (two) times daily.   ferrous sulfate 325 (65 FE) MG tablet Take 325 mg by mouth 2 (two) times daily with a meal.   levothyroxine 200 MCG tablet Commonly known as:  SYNTHROID, LEVOTHROID Take 1 tablet (200 mcg total) by mouth daily before breakfast. What changed:    medication strength  how much to take   metoprolol succinate  25 MG 24 hr tablet Commonly known as:  TOPROL-XL Take 25 mg by mouth daily.        DISCHARGE INSTRUCTIONS:    Follow with PMD in 1-2 weeks.  If you experience worsening of your admission symptoms, develop shortness of breath, life threatening emergency, suicidal or homicidal thoughts you must seek medical attention immediately by calling 911 or calling your MD immediately  if symptoms less severe.  You Must read complete instructions/literature along with all the possible adverse reactions/side effects for all the Medicines you take and  that have been prescribed to you. Take any new Medicines after you have completely understood and accept all the possible adverse reactions/side effects.   Please note  You were cared for by a hospitalist during your hospital stay. If you have any questions about your discharge medications or the care you received while you were in the hospital after you are discharged, you can call the unit and asked to speak with the hospitalist on call if the hospitalist that took care of you is not available. Once you are discharged, your primary care physician will handle any further medical issues. Please note that NO REFILLS for any discharge medications will be authorized once you are discharged, as it is imperative that you return to your primary care physician (or establish a relationship with a primary care physician if you do not have one) for your aftercare needs so that they can reassess your need for medications and monitor your lab values.    Today   CHIEF COMPLAINT:   Chief Complaint  Patient presents with  . Weakness    HISTORY OF PRESENT ILLNESS:  Haley Brooks  is a 84 y.o. female with a known history of hypertension and remote colon cancer. Patient was brought to emergency room for confusion and generalized weakness going on for the past 2-3 days, gradually worsening.  Patient is not able to provide history due to her confusion.  Most of the information was taken from reviewing the medical records and from discussion with the daughter. Per patient's daughter, there was no fever or chills, no chest pain or shortness of breath, no cough, no nausea/vomiting/diarrhea no bleeding.  No sick contacts. Upon evaluation in the emergency room, UA is positive for acute UTI.  Lab results are essentially unremarkable.  Troponin level is slightly elevated at 1.2, but this seems to be the baseline for the patient. Patient is admitted for further evaluation and treatment.     VITAL SIGNS:  Blood  pressure (!) 177/68, pulse 65, temperature 97.6 F (36.4 C), temperature source Oral, resp. rate 18, height 5\' 4"  (1.626 m), weight 63 kg (139 lb), SpO2 99 %.  I/O:    Intake/Output Summary (Last 24 hours) at 02/22/2017 2141 Last data filed at 02/22/2017 0835 Gross per 24 hour  Intake 3 ml  Output 600 ml  Net -597 ml    PHYSICAL EXAMINATION:  GENERAL:  84 y.o.-year-old patient lying in the bed with no acute distress.  EYES: Pupils equal, round, reactive to light and accommodation. No scleral icterus. Extraocular muscles intact.  HEENT: Head atraumatic, normocephalic. Oropharynx and nasopharynx clear.  NECK:  Supple, no jugular venous distention. No thyroid enlargement, no tenderness.  LUNGS: Normal breath sounds bilaterally, no wheezing, rales,rhonchi or crepitation. No use of accessory muscles of respiration.  CARDIOVASCULAR: S1, S2 normal. No murmurs, rubs, or gallops.  ABDOMEN: Soft, non-tender, non-distended. Bowel sounds present. No organomegaly or mass.  EXTREMITIES: No pedal edema, cyanosis, or  clubbing.  NEUROLOGIC: Cranial nerves II through XII are intact. Muscle strength 5/5 in all extremities. Sensation intact. Gait not checked.  PSYCHIATRIC: The patient is alert and oriented x 3.  SKIN: No obvious rash, lesion, or ulcer.   DATA REVIEW:   CBC Recent Labs  Lab 02/20/17 1843  WBC 8.8  HGB 12.5  HCT 38.5  PLT 276    Chemistries  Recent Labs  Lab 02/21/17 0311  NA 139  K 3.6  CL 102  CO2 27  GLUCOSE 95  BUN 24*  CREATININE 1.28*  CALCIUM 8.8*    Cardiac Enzymes No results for input(s): TROPONINI in the last 168 hours.  Microbiology Results  Results for orders placed or performed during the hospital encounter of 02/18/17  Urine Culture     Status: Abnormal   Collection Time: 02/18/17  6:28 PM  Result Value Ref Range Status   Specimen Description   Final    URINE, RANDOM Performed at [facility], [address] [place], [place] [address]     Special Requests   Final    NONE Performed at [facility] [address] [place], [place] [address]    Culture MULTIPLE SPECIES PRESENT, SUGGEST RECOLLECTION (A)  Final   Report Status 02/20/2017 FINAL  Final    RADIOLOGY:  No results found.  EKG:   Orders placed or performed during the hospital encounter of 02/18/17  . ED EKG  . ED EKG  . EKG 12-Lead  . EKG 12-Lead      Management plans discussed with the patient, family and they are in agreement.  CODE STATUS:  Code Status History    Date Active Date Inactive Code Status Order ID Comments User Context   02/19/2017 00:48 02/22/2017 16:26 Full Code [id number]  Amelia Jo, MD Inpatient   12/24/2016 18:44 12/25/2016 19:32 Full Code [id number]  Demetrios Loll, MD Inpatient    Advance Directive Documentation     Most Recent Value  Type of Advance Directive  Healthcare Power of Attorney, Living will  Pre-existing out of facility DNR order (yellow form or pink MOST form)  No data  "MOST" Form in Place?  No data      TOTAL TIME TAKING CARE OF THIS PATIENT: 35 minutes.    Vaughan Basta M.D on 02/22/2017 at 9:41 PM  Between 7am to 6pm - Pager - [phone]  After 6pm go to [email or web address] - password [email or web address] [facility]  Office  [phone]  CC: Primary care physician; Glendon Axe, MD   Note: This dictation was prepared with Dragon dictation along with smaller phrase technology. Any transcriptional errors that result from this process are unintentional.

## 2017-02-22 NOTE — Care Management Note (Signed)
Case Management Note  Patient Details  Name: Haley Brooks MRN: [id number] Date of Birth: 05-17-1931  Subjective/Objective:                    Action/Plan:   Expected Discharge Date:  02/21/17               Expected Discharge Plan:  [place]  In-House Referral:     Discharge planning Services  CM Consult  Post Acute Care Choice:  Home Health Choice offered to:  Adult Children  DME Arranged:    DME Agency:     HH Arranged:  PT, Nurse's Aide [place] Agency:  [facility]  Status of Service:  Completed, signed off  If discussed at [place] of Stay Meetings, dates discussed:    Additional Comments:  Beverly Sessions, RN 02/22/2017, 1:26 PM

## 2017-02-22 NOTE — Progress Notes (Signed)
[facility] [place] NAME: Haley Brooks    MR#:  [id number]  DATE OF BIRTH:  [birth year]  SUBJECTIVE:  CHIEF COMPLAINT:   Chief Complaint  Patient presents with  . Weakness    Came with confusion and generalized weakness, noted to have UTI.  Yesterday patient was completely disoriented and confused. Seen her twice today once in morning and then in afternoon again, more calm and oriented.  REVIEW OF SYSTEMS:   Review of Systems  Constitutional: Negative for chills, malaise/fatigue and weight loss.  HENT: Negative for congestion, ear discharge and hearing loss.   Eyes: Negative for blurred vision, pain and discharge.  Respiratory: Negative for cough, sputum production and shortness of breath.   Cardiovascular: Negative for palpitations and leg swelling.  Gastrointestinal: Negative for abdominal pain, constipation, nausea and vomiting.  Genitourinary: Negative for frequency and urgency.  Musculoskeletal: Negative for back pain and myalgias.  Skin: Negative for rash.  Neurological: Negative for tingling, tremors, focal weakness and seizures.  Psychiatric/Behavioral: Negative for depression.    DRUG ALLERGIES:  No Known Allergies  VITALS:  Blood pressure (!) 164/75, pulse (!) 55, temperature 97.6 F (36.4 C), temperature source Oral, resp. rate 18, height 5\' 4"  (1.626 m), weight 63 kg (139 lb), SpO2 99 %.  PHYSICAL EXAMINATION:  GENERAL:  84 y.o.-year-old patient lying in the bed with no acute distress.  EYES: Pupils equal, round, reactive to light and accommodation. No scleral icterus. Extraocular muscles intact.  HEENT: Head atraumatic, normocephalic. Oropharynx and nasopharynx clear.  NECK:  Supple, no jugular venous distention. No thyroid enlargement, no tenderness.  LUNGS: Normal breath sounds bilaterally, no wheezing, rales,rhonchi or crepitation. No use of accessory muscles of respiration.  CARDIOVASCULAR: S1, S2 normal. No  murmurs, rubs, or gallops.  ABDOMEN: Soft, nontender, nondistended. Bowel sounds present. No organomegaly or mass.  EXTREMITIES: No pedal edema, cyanosis, or clubbing.  NEUROLOGIC: Cranial nerves II through XII are intact. Muscle strength 4/5 in all extremities. Sensation intact. Gait not checked.  PSYCHIATRIC: The patient is alert and oriented x 0, delirious and thoughts of hallucination.  SKIN: No obvious rash, lesion, or ulcer.   Physical Exam LABORATORY PANEL:   CBC Recent Labs  Lab 02/20/17 1843  WBC 8.8  HGB 12.5  HCT 38.5  PLT 276   ------------------------------------------------------------------------------------------------------------------  Chemistries  Recent Labs  Lab 02/21/17 0311  NA 139  K 3.6  CL 102  CO2 27  GLUCOSE 95  BUN 24*  CREATININE 1.28*  CALCIUM 8.8*   ------------------------------------------------------------------------------------------------------------------  Cardiac Enzymes No results for input(s): TROPONINI in the last 168 hours. ------------------------------------------------------------------------------------------------------------------  RADIOLOGY:  Ct Head Wo Contrast  Result Date: 02/20/2017 CLINICAL DATA:  84 y/o F; unexplained altered level of consciousness. EXAM: CT HEAD WITHOUT CONTRAST TECHNIQUE: Contiguous axial images were obtained from the base of the skull through the vertex without intravenous contrast. COMPARISON:  12/24/2016 CT of the head.  01/28/2012 MRI of the head. FINDINGS: Brain: No evidence of acute infarction, hemorrhage, hydrocephalus, extra-axial collection or mass lesion/mass effect. Stable white matter hypodensities in subcortical and periventricular white matter prominent hypodensities in central white matter surrounding anterior basal ganglia, likely chronic microvascular ischemic changes. Stable small chronic lacunar infarcts in basal ganglia and very small chronic infarct of right cerebellar  hemisphere. Vascular: Calcific atherosclerosis of carotid siphons. No hyperdense vessel identified. Skull: Normal. Negative for fracture or focal lesion. Sinuses/Orbits: No acute finding. Other: Bilateral intra-ocular lens replacement. IMPRESSION: 1. No acute intracranial  abnormality identified. 2. Stable chronic microvascular ischemic changes and small chronic lacunar infarctions. Electronically Signed   By: Kristine Garbe M.D.   On: 02/20/2017 18:35    ASSESSMENT AND PLAN:   Principal Problem:   Acute delirium Active Problems:   Acute encephalopathy   Alzheimer's dementia  1.  Acute metabolic encephalopathy, related to acute UTI.   Had delirium yesterday.      on IV ceftriaxone, multiple species per final urine culture result.   gentle IV hydration.   With worsening mental status- CT head was done and negative.   Pt much improved now, and likely that was delirium due to infection.  Stable today as per me for discharge, daughter still have concerns about her " not completely being herself"  2.  Acute UTI, confirmed per UA; final urine culture result is pending.  Patient was started on IV ceftriaxone.  3.  CKD 2, stable creatinine is 1,  at baseline.  Continue to monitor kidney function closely and avoid nephrotoxic medications. 4.  Hypertension.  BP is currently elevated likely secondary to IV fluids.    restart home medications for blood pressure.  Continue to monitor closely.  As blood pressure continues to be high, I have increased her metoprolol and benazepril.   5. Hypothyroidism    TSH is high, increase the dose of levothyroxine.   PT evaluation for generalized weakness.  Suggest HHAPT on discharge.  All the records are reviewed and case discussed with Care Management/Social Workerr. Management plans discussed with the patient, family and they are in agreement.  CODE STATUS: Full.  TOTAL TIME TAKING CARE OF THIS PATIENT: 35 minutes.    POSSIBLE D/C IN 1-2 DAYS,  DEPENDING ON CLINICAL CONDITION.   Vaughan Basta M.D on 02/22/2017   Between 7am to 6pm - Pager - [phone]  After 6pm go to [email or web address] - password [email or web address] [facility]  Office  [phone]  CC: Primary care physician; Glendon Axe, MD  Note: This dictation was prepared with Dragon dictation along with smaller phrase technology. Any transcriptional errors that result from this process are unintentional.

## 2017-02-22 NOTE — Progress Notes (Signed)
Gave D/C instructions to pt and daughter. I alerted her of the change in medication administration. She and her daughter stated that understood so I took out her IV without incident and volunteer services took her downstairs to be released to daughter.

## 2017-04-17 ENCOUNTER — Ambulatory Visit (INDEPENDENT_AMBULATORY_CARE_PROVIDER_SITE_OTHER): Payer: Medicare Other | Admitting: Obstetrics and Gynecology

## 2017-04-17 ENCOUNTER — Encounter: Payer: Self-pay | Admitting: Obstetrics and Gynecology

## 2017-04-17 VITALS — BP 140/72 | HR 97 | Wt 141.0 lb

## 2017-04-17 DIAGNOSIS — Z4689 Encounter for fitting and adjustment of other specified devices: Secondary | ICD-10-CM | POA: Diagnosis not present

## 2017-04-17 NOTE — Progress Notes (Signed)
Obstetrics & Gynecology Office Visit   Chief Complaint:  Chief Complaint  Patient presents with  . Follow-up    Pessary check    History of Present Illness: Haley Brooks is a 84 y.o. female who is seen today for a follow-up appointment for a  pessary check. She is using a size 5 ing with support pessary for anterior and posterior vaginal vault prolapse She Denies vaginal bleeding.  She Denies vaginal discharge.  She is voiding and defecating without difficulty.  Review of Systems: Review of Systems  Constitutional: Negative.   HENT: Negative.   Eyes: Negative.   Respiratory: Negative.   Cardiovascular: Negative.   Gastrointestinal: Negative.   Genitourinary: Negative.   Musculoskeletal: Negative.   Skin: Negative.   Neurological: Negative.   Endo/Heme/Allergies: Negative.   Psychiatric/Behavioral: Negative.      Past Medical History:  Past Medical History:  Diagnosis Date  . Cancer [facility])    colon cancer   . Hemorrhoid   . Hypertension   . Thyroid disease     Past Surgical History:  Past Surgical History:  Procedure Laterality Date  . COLON SURGERY    . FRACTURE SURGERY      Gynecologic History: No LMP recorded. Patient is postmenopausal.  Obstetric History: G3P3  Family History:  Family History  Problem Relation Age of Onset  . Colon cancer Mother   . Diabetes Father   . Cancer Brother     Social History:  Social History   Socioeconomic History  . Marital status: Married    Spouse name: Not on file  . Number of children: Not on file  . Years of education: Not on file  . Highest education level: Not on file  Occupational History  . Not on file  Social Needs  . Financial resource strain: Not on file  . Food insecurity:    Worry: Not on file    Inability: Not on file  . Transportation needs:    Medical: Not on file    Non-medical: Not on file  Tobacco Use  . Smoking status: Never Smoker  . Smokeless tobacco: Never Used    Substance and Sexual Activity  . Alcohol use: No  . Drug use: No  . Sexual activity: Never  Lifestyle  . Physical activity:    Days per week: Not on file    Minutes per session: Not on file  . Stress: Not on file  Relationships  . Social connections:    Talks on phone: Not on file    Gets together: Not on file    Attends religious service: Not on file    Active member of club or organization: Not on file    Attends meetings of clubs or organizations: Not on file    Relationship status: Not on file  . Intimate partner violence:    Fear of current or ex partner: Not on file    Emotionally abused: Not on file    Physically abused: Not on file    Forced sexual activity: Not on file  Other Topics Concern  . Not on file  Social History Narrative  . Not on file    Allergies:  No Known Allergies  Medications: Prior to Admission medications   Medication Sig Start Date End Date Taking? Authorizing Provider  alendronate (FOSAMAX) 70 MG tablet Take 1 tablet by mouth 1 day or 1 dose. 03/25/15  Yes [provider]  amLODipine (NORVASC) 10 MG tablet Take 10  mg by mouth daily.    Yes [provider]  apixaban (ELIQUIS) 5 MG TABS tablet Take 5 mg by mouth 2 (two) times daily.   Yes [provider]  aspirin 325 MG EC tablet Take 325 mg by mouth daily.    Yes [provider]  atorvastatin (LIPITOR) 20 MG tablet Take 20 mg by mouth daily.   Yes [provider]  benazepril (LOTENSIN) 40 MG tablet Take 1 tablet (40 mg total) by mouth daily. 02/22/17  Yes Vaughan Basta, MD  donepezil (ARICEPT) 10 MG tablet Take 1 tablet by mouth 2 (two) times daily. 12/21/16  Yes [provider]  ferrous sulfate 325 (65 FE) MG tablet Take 325 mg by mouth 2 (two) times daily with a meal.   Yes [provider]  levothyroxine (SYNTHROID, LEVOTHROID) 200 MCG tablet Take 1 tablet (200 mcg total) by mouth daily before breakfast. 02/22/17  Yes  Vaughan Basta, MD  metoprolol succinate (TOPROL-XL) 25 MG 24 hr tablet Take 25 mg by mouth daily.   Yes [provider]    Physical Exam Vitals:  Vitals:   04/17/17 0908  BP: 140/72  Pulse: 97   No LMP recorded. Patient is postmenopausal.  General: NAD HEENT: normocephalic, anicteric Thyroid: no enlargement, no palpable nodules Pulmonary: No increased work of breathing Cardiovascular: RRR, distal pulses 2+ Abdomen: NABS, soft, non-tender, non-distended.  Umbilicus without lesions.  No hepatomegaly, splenomegaly or masses palpable. No evidence of hernia  Genitourinary:  External: Normal external female genitalia.  Normal urethral meatus, normal Bartholin's and Skene's glands.    Vagina: Normal vaginal mucosa, anterior posterior wall prolapse noted, no errosions  Cervix: Grossly normal in appearance, no bleeding  Uterus: Non-enlarged, mobile, normal contour.  No CMT  Adnexa: ovaries non-enlarged, no adnexal masses  Rectal: deferred, she does have a small external hemorrhoid with some bleeding noted.    Lymphatic: no evidence of inguinal lymphadenopathy Extremities: no edema, erythema, or tenderness Neurologic: Grossly intact Psychiatric: mood appropriate, affect full  Female chaperone present for pelvic  portions of the physical exam  Assessment: 84 y.o. G3P3 for pessary check  Plan: Problem List Items Addressed This Visit    None    Visit Diagnoses    Pessary maintenance    -  Primary     1) Hemorrhoid - reports no bothersome symptoms related to hemorrhoid.  She is on blood thinner and notes occasional bleeding from it but nothing significant.  Denies melena.  No abdominal pain or bloating.  2) Pessary - clean and replaced today.  Good on premarin cream.  Is managing prolapse well without any discomfort noted by patient.  3) Return in about 3 months (around 07/17/2017) for pessary check.   [name], MD, [facility], [facility] 04/17/2017, 9:27 AM

## 2017-07-17 ENCOUNTER — Encounter: Payer: Self-pay | Admitting: Obstetrics and Gynecology

## 2017-07-17 ENCOUNTER — Ambulatory Visit: Payer: Medicare Other | Admitting: Obstetrics and Gynecology

## 2017-07-17 VITALS — BP 126/70 | HR 67 | Wt 137.0 lb

## 2017-07-17 DIAGNOSIS — Z4689 Encounter for fitting and adjustment of other specified devices: Secondary | ICD-10-CM

## 2017-07-17 NOTE — Progress Notes (Signed)
Obstetrics & Gynecology Office Visit   Chief Complaint:  Chief Complaint  Patient presents with  . Follow-up    pessary check    History of Present Illness: Haley Brooks is a 84 y.o. female who is seen today for a follow-up appointment for a  pessary check. She is using a size 5 ring with support pessary for vaginal vault prolapse  She Denies vaginal bleeding.  She Denies vaginal discharge.  She is voiding and defecating without difficulty.  Pessary has been comfortable, no concerns about seating of pessary per patient.  Review of Systems: Review of Systems  Constitutional: Negative.   Genitourinary: Negative.   Skin: Negative.      Past Medical History:  Past Medical History:  Diagnosis Date  . Cancer [facility])    colon cancer   . Hemorrhoid   . Hypertension   . Thyroid disease     Past Surgical History:  Past Surgical History:  Procedure Laterality Date  . COLON SURGERY    . FRACTURE SURGERY      Gynecologic History: No LMP recorded. Patient is postmenopausal.  Obstetric History: G3P3  Family History:  Family History  Problem Relation Age of Onset  . Colon cancer Mother   . Diabetes Father   . Cancer Brother     Social History:  Social History   Socioeconomic History  . Marital status: Married    Spouse name: Not on file  . Number of children: Not on file  . Years of education: Not on file  . Highest education level: Not on file  Occupational History  . Not on file  Social Needs  . Financial resource strain: Not on file  . Food insecurity:    Worry: Not on file    Inability: Not on file  . Transportation needs:    Medical: Not on file    Non-medical: Not on file  Tobacco Use  . Smoking status: Never Smoker  . Smokeless tobacco: Never Used  Substance and Sexual Activity  . Alcohol use: No  . Drug use: No  . Sexual activity: Never  Lifestyle  . Physical activity:    Days per week: Not on file    Minutes per session: Not on file    . Stress: Not on file  Relationships  . Social connections:    Talks on phone: Not on file    Gets together: Not on file    Attends religious service: Not on file    Active member of club or organization: Not on file    Attends meetings of clubs or organizations: Not on file    Relationship status: Not on file  . Intimate partner violence:    Fear of current or ex partner: Not on file    Emotionally abused: Not on file    Physically abused: Not on file    Forced sexual activity: Not on file  Other Topics Concern  . Not on file  Social History Narrative  . Not on file    Allergies:  No Known Allergies  Medications: Prior to Admission medications   Medication Sig Start Date End Date Taking? Authorizing Provider  alendronate (FOSAMAX) 70 MG tablet Take 1 tablet by mouth 1 day or 1 dose. 03/25/15   [provider]  amLODipine (NORVASC) 10 MG tablet Take 10 mg by mouth daily.     [provider]  apixaban (ELIQUIS) 5 MG TABS tablet Take 5 mg by mouth 2 (two)  times daily.    [provider]  aspirin 325 MG EC tablet Take 325 mg by mouth daily.     [provider]  atorvastatin (LIPITOR) 20 MG tablet Take 20 mg by mouth daily.    [provider]  benazepril (LOTENSIN) 40 MG tablet Take 1 tablet (40 mg total) by mouth daily. 02/22/17   Vaughan Basta, MD  donepezil (ARICEPT) 10 MG tablet Take 1 tablet by mouth 2 (two) times daily. 12/21/16   [provider]  ferrous sulfate 325 (65 FE) MG tablet Take 325 mg by mouth 2 (two) times daily with a meal.    [provider]  levothyroxine (SYNTHROID, LEVOTHROID) 200 MCG tablet Take 1 tablet (200 mcg total) by mouth daily before breakfast. 02/22/17   Vaughan Basta, MD  metoprolol succinate (TOPROL-XL) 25 MG 24 hr tablet Take 25 mg by mouth daily.    [provider]    Physical Exam Vitals:  Vitals:   07/17/17 0858  BP: 126/70  Pulse: 67   No LMP  recorded. Patient is postmenopausal.  General: NAD HEENT: normocephalic, anicteric Pulmonary: No increased work of breathing Genitourinary:  External: Normal external female genitalia.  Normal urethral meatus, normal.  Pessary removed without difficulty for cleaning.  Speculum exam reveals normal atrophic vaginal mucosa without evidence of erosion. Extremities: no edema, erythema, or tenderness Neurologic: Grossly intact Psychiatric: mood appropriate, affect full  Female chaperone present for pelvic  portions of the physical exam  Assessment: 84 y.o. G3P3 presenting for pessary maintenance visit  Plan: Problem List Items Addressed This Visit    None    Visit Diagnoses    Pessary maintenance    -  Primary     - Pessary cleaned today, replaced - Patient is good and premarin cream - Follow up 3 months for repeat pessary check  [name], MD, [place] 07/17/2017, 9:14 AM

## 2017-10-21 ENCOUNTER — Encounter: Payer: Self-pay | Admitting: Obstetrics and Gynecology

## 2017-10-21 ENCOUNTER — Ambulatory Visit (INDEPENDENT_AMBULATORY_CARE_PROVIDER_SITE_OTHER): Payer: Medicare Other | Admitting: Obstetrics and Gynecology

## 2017-10-21 VITALS — BP 136/78 | HR 61 | Wt 140.0 lb

## 2017-10-21 DIAGNOSIS — Z4689 Encounter for fitting and adjustment of other specified devices: Secondary | ICD-10-CM

## 2017-10-21 NOTE — Progress Notes (Signed)
Obstetrics & Gynecology Office Visit   Chief Complaint:  Chief Complaint  Patient presents with  . Follow-up    Pessary check    History of Present Illness: Haley Brooks is a 84 y.o. female who is seen today for a follow-up appointment for a  pessary check. She is using a size 5 ring with support pessary for vaginal vault prolapse  She Denies vaginal bleeding.  She Denies vaginal discharge.  She is voiding and defecating without difficulty.  Pessary has been comfortable, no concerns about seating of pessary per patient.   Review of Systems: Review of systems negative unless otherwise noted in HPI  Past Medical History:  Past Medical History:  Diagnosis Date  . Cancer [facility])    colon cancer   . Hemorrhoid   . Hypertension   . Thyroid disease     Past Surgical History:  Past Surgical History:  Procedure Laterality Date  . COLON SURGERY    . FRACTURE SURGERY      Gynecologic History: No LMP recorded. Patient is postmenopausal.  Obstetric History: G3P3  Family History:  Family History  Problem Relation Age of Onset  . Colon cancer Mother   . Diabetes Father   . Cancer Brother     Social History:  Social History   Socioeconomic History  . Marital status: Married    Spouse name: Not on file  . Number of children: Not on file  . Years of education: Not on file  . Highest education level: Not on file  Occupational History  . Not on file  Social Needs  . Financial resource strain: Not on file  . Food insecurity:    Worry: Not on file    Inability: Not on file  . Transportation needs:    Medical: Not on file    Non-medical: Not on file  Tobacco Use  . Smoking status: Never Smoker  . Smokeless tobacco: Never Used  Substance and Sexual Activity  . Alcohol use: No  . Drug use: No  . Sexual activity: Never  Lifestyle  . Physical activity:    Days per week: Not on file    Minutes per session: Not on file  . Stress: Not on file  Relationships    . Social connections:    Talks on phone: Not on file    Gets together: Not on file    Attends religious service: Not on file    Active member of club or organization: Not on file    Attends meetings of clubs or organizations: Not on file    Relationship status: Not on file  . Intimate partner violence:    Fear of current or ex partner: Not on file    Emotionally abused: Not on file    Physically abused: Not on file    Forced sexual activity: Not on file  Other Topics Concern  . Not on file  Social History Narrative  . Not on file    Allergies:  No Known Allergies  Medications: Prior to Admission medications   Medication Sig Start Date End Date Taking? Authorizing Provider  alendronate (FOSAMAX) 70 MG tablet Take 1 tablet by mouth 1 day or 1 dose. 03/25/15   [provider]  amLODipine (NORVASC) 10 MG tablet Take 10 mg by mouth daily.     [provider]  apixaban (ELIQUIS) 5 MG TABS tablet Take 5 mg by mouth 2 (two) times daily.    [provider]  aspirin 325 MG EC tablet Take 325 mg by mouth daily.     [provider]  atorvastatin (LIPITOR) 20 MG tablet Take 20 mg by mouth daily.    [provider]  benazepril (LOTENSIN) 40 MG tablet Take 1 tablet (40 mg total) by mouth daily. 02/22/17   Vaughan Basta, MD  donepezil (ARICEPT) 10 MG tablet Take 1 tablet by mouth 2 (two) times daily. 12/21/16   [provider]  ferrous sulfate 325 (65 FE) MG tablet Take 325 mg by mouth 2 (two) times daily with a meal.    [provider]  levothyroxine (SYNTHROID, LEVOTHROID) 200 MCG tablet Take 1 tablet (200 mcg total) by mouth daily before breakfast. 02/22/17   Vaughan Basta, MD  metoprolol succinate (TOPROL-XL) 25 MG 24 hr tablet Take 25 mg by mouth daily.    [provider]    Physical Exam Vitals:  Vitals:   10/21/17 0917  BP: 136/78  Pulse: 61   No LMP recorded. Patient is  postmenopausal.  General: NAD HEENT: normocephalic, anicteric Pulmonary: No increased work of breathing Genitourinary:  External: Normal external female genitalia.  Normal urethral meatus, normal  Bartholin's and Skene's glands.    Vagina: Normal vaginal mucosa, vaginal vault prolapse.    Pessary removed without difficulty for cleaning.  Speculum exam reveals normal atrophic vaginal mucosa without evidence of erosion.  Lymphatic: no evidence of inguinal lymphadenopathy Extremities: no edema, erythema, or tenderness Neurologic: Grossly intact Psychiatric: mood appropriate, affect full  Female chaperone present for pelvic  portions of the physical exam  Assessment: 84 y.o. G3P3 pessary follow up  Plan: Problem List Items Addressed This Visit    None    Visit Diagnoses    Encounter for pessary maintenance    -  Primary     - Pessary cleaned today, replaced - Continue premarin cream - A total of 15 minutes were spent in face-to-face contact with the patient during this encounter with over half of that time devoted to counseling and coordination of care. - Follow up 3 months for repeat pessary check   [name], MD, [facility] 10/21/2017, 8:56 PM

## 2017-11-19 ENCOUNTER — Other Ambulatory Visit: Payer: Self-pay | Admitting: Obstetrics and Gynecology

## 2017-11-19 NOTE — Telephone Encounter (Signed)
Please advise 

## 2017-11-26 ENCOUNTER — Other Ambulatory Visit: Payer: Self-pay

## 2017-11-26 ENCOUNTER — Encounter: Payer: Self-pay | Admitting: Physical Therapy

## 2017-11-26 ENCOUNTER — Ambulatory Visit: Payer: Medicare Other | Attending: Internal Medicine | Admitting: Physical Therapy

## 2017-11-26 DIAGNOSIS — Z9181 History of falling: Secondary | ICD-10-CM

## 2017-11-26 DIAGNOSIS — M6281 Muscle weakness (generalized): Secondary | ICD-10-CM | POA: Insufficient documentation

## 2017-11-26 DIAGNOSIS — R262 Difficulty in walking, not elsewhere classified: Secondary | ICD-10-CM | POA: Insufficient documentation

## 2017-11-26 NOTE — Therapy (Signed)
[place] MAIN [facility] SERVICES [address] [place], [place], [address] Phone: [phone]   Fax:  [phone]  Physical Therapy Evaluation  Patient Details  Name: Haley Brooks MRN: [id number] Date of Birth: [birth year] Referring Provider (PT): Glendon Axe, MD   Encounter Date: 11/26/2017  PT End of Session - 11/26/17 1810    Visit Number  1    Number of Visits  9    Date for PT Re-Evaluation  01/21/18    Authorization Type  1/10 (PN 12/26/2017)    PT Start Time  1600    PT Stop Time  1700    PT Time Calculation (min)  60 min    Equipment Utilized During Treatment  Gait belt    Activity Tolerance  Patient tolerated treatment well    Behavior During Therapy  [facility] for tasks assessed/performed       Past Medical History:  Diagnosis Date  . Cancer [facility])    colon cancer   . Hemorrhoid   . Hypertension   . Thyroid disease     Past Surgical History:  Procedure Laterality Date  . COLON SURGERY    . FRACTURE SURGERY      There were no vitals filed for this visit.   Subjective Assessment - 11/26/17 1808    Subjective  Chief complaint: Patient used to walk independently. Patient walks with a SPC because of fear of falling in the community. At home, patient walks without the cane (furniture walking when necessary). Patient had a controlled descent to the floor a couple of weeks ago (she states that she slid to the floor).    Pertinent History  History of hypertension, hyperlipidemia, coronary artery disease s/p MI in 2006, hypothyroidism, osteoporosis, bilateral pulmonary embolism, previously on chronic warfarin therapy, h/o stroke in January 2014. Patient is currently on Eliquis. History of colon cancer treated in her 35s. Patient walks with a SPC.     Limitations  Lifting;Standing;Walking;House hold activities    How long can you sit comfortably?  n/a    How long can you stand comfortably?   >5 min w/ UE support    How long can  you walk comfortably?  ~ 5 min w/ [facility]    Patient Stated Goals  feel more balanced; walk better; not be afraid of falling    Currently in Pain?  No/denies    Multiple Pain Sites  No       SUBJECTIVE Chief complaint: Patient used to walk independently. Patient walks with a SPC because of fear of falling in the community. At home, patient walks without the cane (furniture walking when necessary). Patient had a controlled descent to the floor a couple of weeks ago (she states that she slid to the floor).  Onset: April 2019 Imaging: None Recent changes in overall health/medication: No Directional pattern for falls: None Prior history of physical therapy for balance: yes, but patient did not enjoy physical therapy. Follow-up appointment with MD: Upcoming, unsure of exact date Red flags (bowel/bladder changes, saddle paresthesia, personal history of cancer, chills/fever, night sweats, unrelenting pain) Negative  OBJECTIVE  MUSCULOSKELETAL: Tremor: Absent Bulk: Normal Tone: Normal, no clonus  Posture Generally slumped (posterior tilt, rounded shoulders, forward head)  Gait SPC, medial knee collapse and pronation present in stance bilaterally. Patient also presents with a decreased speed, decreased foot clearance, and bilateral Trendelenburg R>L.   Strength R/L 4/4 Hip flexion 3+/3+ Hip external rotation 3+/3+ Hip internal rotation 4/4 Hip abduction (seated)  4/4 Hip adduction (seated) 4/4 Knee extension 3+/3+ Knee flexion 4/4 Ankle Plantarflexion (seated) 4/4 Ankle Dorsiflexion   NEUROLOGICAL:  Mental Status Patient is oriented to person, place and time.  Recent memory is intact.  Remote memory is intact.  Attention span and concentration are intact.  Expressive speech is intact.  Patient's fund of knowledge is within normal limits for educational level.  Cranial Nerves Visual acuity and visual fields are intact  Extraocular muscles are intact  Facial sensation is  intact bilaterally  Facial strength is intact bilaterally  Hearing is normal as tested by gross conversation Palate elevates midline, normal phonation  Shoulder shrug strength is intact  Tongue protrudes midline   Sensation Grossly intact to light touch bilateral UEs/LEs as determined by testing dermatomes L2-S2 respectively Proprioception and hot/cold testing deferred on this date  Reflexes R/L 2+/2+ Knee Jerk (L3/4) 2+/2+ Ankle Jerk (S1/2)  Coordination/Cerebellar Finger to Nose: WNL Heel to Shin: WNL Rapid alternating movements: WNL Finger Opposition: WNL Pronator Drift: Negative  FUNCTIONAL OUTCOME MEASURES   Results Comments  BERG 38/56 Fall risk, in need of intervention  DGI 12/24 Fall risk, in need of intervention  TUG 20 seconds (SPC); 24 sec no AD Fall risk, in need of intervention  5TSTS 34 seconds (BUE support) Fall risk, in need of intervention  10 Meter Gait Speed Self-selected: 24s = 0.42 m/s; Fastest: 19s = 0.53 m/s (w/ SPC) Below normative values for full community ambulation  ABC Scale % Deferred to next visit     Objective measurements completed on examination: See above findings.    PT Education - 11/26/17 1810    Education Details  POC, outcome measures, balance deficits    Person(s) Educated  Patient    Methods  Explanation;Demonstration;Verbal cues    Comprehension  Verbalized understanding;Need further instruction       PT Short Term Goals - 11/26/17 1812      PT SHORT TERM GOAL #1   Title  Patient will be independent with HEP in order to maintain gains made in therapy and decrease risk of falls.    Baseline  not initiated    Time  2    Period  Weeks    Status  New    Target Date  12/10/17        PT Long Term Goals - 11/26/17 1813      PT LONG TERM GOAL #1   Title  Pt will improve DGI by at least 6 points in order to demonstrate clinically significant improvement in balance and decreased risk for falls.    Baseline  11/26/17: 12/24     Time  8    Period  Weeks    Status  New    Target Date  01/21/18      PT LONG TERM GOAL #2   Title  Pt will improve BERG by at least 6 points in order to demonstrate clinically significant improvement in balance and decrease risk of falls.    Baseline  11/26/2017: 38/56    Time  8    Period  Weeks    Status  New    Target Date  01/21/18      PT LONG TERM GOAL #3   Title  Pt will improve ABC by at least 13% in order to demonstrate clinically significant improvement in balance confidence.     Baseline  11/26/2017: not taken    Time  8    Period  Weeks    Status  New    Target Date  01/21/18      PT LONG TERM GOAL #4   Title  Pt will decrease 5TSTS by at least 6 seconds in order to demonstrate clinically significant improvement in LE strength and decreased risk of falls.    Baseline  11/26/2017: 34 seconds (BUE support)    Time  8    Period  Weeks    Status  New    Target Date  01/21/18      PT LONG TERM GOAL #5   Title  Patient will improve gait speed as measured in 10MWT to >0.82 m/s comfortable speed and >1.0 m/s fast speed in order to be in the normative value ranges for her age group with the [place].     Baseline  11/26/2017: Self-selected: 24s = 0.42 m/s; Fastest: 19s = 0.53 m/s    Time  8    Period  Weeks    Status  New    Target Date  01/21/18      Additional Long Term Goals   Additional Long Term Goals  Yes      PT LONG TERM GOAL #6   Title  Patient will decrease TUG performed with LRAD to below 14 seconds in order to demonstrate decreased fall risk.    Baseline  11/26/2017: 20 seconds (SPC); 24 sec no AD    Time  8    Period  Weeks    Status  New    Target Date  01/21/18             Plan - 11/26/17 1709    Clinical Impression Statement  Pt is a delightful 84 year-old female referred for difficulty with balance with PMH significant for neuropathy, history of stroke in January of 2014. PT examination reveals considerable deficits in gait speed (0.42  m/s); BLE strength (5TSTS w/ BUE support 34 seconds); and balance (DGI 12/24; Berg Balance 38/56). Patient's ability to achieve improvements will be supported by her excellent motivation, strong family support, and active and independent lifestyle; however, progress may be negatively impacted by financial constraints, past medical history, and increased fear of falling.  Pt will benefit from skilled PT services to address deficits in balance and decrease risk for future falls.     History and Personal Factors relevant to plan of care:  (+) single story home, family support, motivation, safety awareness; (-) financial constraints, PMH/comorbidities, previous PT unsuccessful/not enjoyed, mild incontinence/urinary frequency    Clinical Presentation  Evolving    Clinical Presentation due to:  Moderate (evolving): 1-2 personal factors/comorbidities, 3 or more body systems/activity limitations/participation restrictions     Clinical Decision Making  Moderate    Rehab Potential  Good    PT Frequency  1x / week    PT Duration  8 weeks    PT Treatment/Interventions  Canalith Repostioning;Aquatic Therapy;Cryotherapy;Moist Heat;Ultrasound;Gait training;Stair training;Functional mobility training;Therapeutic activities;Therapeutic exercise;Balance training;Patient/family education;Neuromuscular re-education;Manual techniques;Spinal Manipulations;Joint Manipulations;Vestibular;Orthotic Fit/Training;Dry needling;Energy conservation;Electrical Stimulation;Iontophoresis 4mg /ml Dexamethasone;Splinting;Taping    PT Next Visit Plan  ABC Scale; BLE strengthening and balance    PT Home Exercise Plan  not provided at this time    Consulted and Agree with Plan of Care  Patient       Patient will benefit from skilled therapeutic intervention in order to improve the following deficits and impairments:  Abnormal gait, Improper body mechanics, Impaired sensation, Postural dysfunction, Decreased range of motion, Decreased  strength, Decreased activity tolerance, Decreased endurance, Decreased balance, Difficulty walking, Pain  Visit  Diagnosis: Difficulty in walking, not elsewhere classified  Muscle weakness (generalized)  History of falling     Problem List Patient Active Problem List   Diagnosis Date Noted  . Acute delirium 02/21/2017  . Alzheimer's dementia ([place]) 02/21/2017  . Acute encephalopathy 02/18/2017  . Acute metabolic encephalopathy 38/37/7939  . UTI (urinary tract infection) 12/24/2016  . Hemorrhoid   . History of colon cancer 04/05/2015  . Chronic anemia 03/10/2015  . Osteopenia 12/11/2014  . CKD (chronic kidney disease) stage 3, GFR 30-59 ml/min (HCC) 06/18/2014  . SDAT (senile dementia of Alzheimer's type) ([place]) 06/18/2014  . Acquired hypothyroidism 03/01/2014  . Essential hypertension 03/01/2014  . Mixed hyperlipidemia 03/01/2014   Myles Gip PT, DPT [phone] 11/27/2017, 8:43 AM  [place] MAIN [facility] SERVICES [address] [place], [place], [address] Phone: [phone]   Fax:  [phone]  Name: Haley Brooks MRN: [id number] Date of Birth: [birth year]

## 2017-12-03 ENCOUNTER — Ambulatory Visit: Payer: Medicare Other | Attending: Internal Medicine

## 2017-12-03 DIAGNOSIS — M6281 Muscle weakness (generalized): Secondary | ICD-10-CM | POA: Insufficient documentation

## 2017-12-03 DIAGNOSIS — R262 Difficulty in walking, not elsewhere classified: Secondary | ICD-10-CM | POA: Diagnosis not present

## 2017-12-03 DIAGNOSIS — Z9181 History of falling: Secondary | ICD-10-CM | POA: Insufficient documentation

## 2017-12-03 NOTE — Therapy (Signed)
[place] MAIN [facility] SERVICES [address] [place], [place], [address] Phone: [phone]   Fax:  [phone]  Physical Therapy Treatment  Patient Details  Name: Haley Brooks MRN: [id number] Date of Birth: 1931/06/15 Referring Provider (PT): Glendon Axe, MD   Encounter Date: 12/03/2017  PT End of Session - 12/03/17 1106    Visit Number  2    Number of Visits  9    Date for PT Re-Evaluation  01/21/18    Authorization Type  2/10 (PN 12/26/2017)    PT Start Time  1100    PT Stop Time  1145    PT Time Calculation (min)  45 min    Equipment Utilized During Treatment  Gait belt    Activity Tolerance  Patient tolerated treatment well    Behavior During Therapy  [facility] for tasks assessed/performed       Past Medical History:  Diagnosis Date  . Cancer [facility])    colon cancer   . Hemorrhoid   . Hypertension   . Thyroid disease     Past Surgical History:  Procedure Laterality Date  . COLON SURGERY    . FRACTURE SURGERY      There were no vitals filed for this visit.  Subjective Assessment - 12/03/17 1104    Subjective  Patient presents no stumbles or falls since last week. No pain today.     Pertinent History  History of hypertension, hyperlipidemia, coronary artery disease s/p MI in 2006, hypothyroidism, osteoporosis, bilateral pulmonary embolism, previously on chronic warfarin therapy, h/o stroke in January 2014. Patient is currently on Eliquis. History of colon cancer treated in her 86s. Patient walks with a SPC.     Limitations  Lifting;Standing;Walking;House hold activities    How long can you sit comfortably?  n/a    How long can you stand comfortably?   >5 min w/ UE support    How long can you walk comfortably?  ~ 5 min w/ [facility]    Patient Stated Goals  feel more balanced; walk better; not be afraid of falling    Currently in Pain?  No/denies      Nustep lvl 2 3 minutes RPM>60 for cardiovascular support.   // bars  Airex  pad: static stance 30 seconds: horizontal head turns 2x 60 seconds ;  6" step toe taps CGA 10x each LE ; decreasing UE support  Ambulate with 2x4 between feet to increase BOS x 6 lengths of bars.    Speed ladder: to promote wider BOS and stabilityone step in each box.   Ambulate in hallway: 2x 86 ft with cues for widening BOS with SPC and CGA.   Ambulate in hallway with horizontal head turns 2x 86 ft with SPC and CGA  Education on HEP Access Code: [email or web address]  URL: [email or web address]  Date: 12/03/2017  Prepared by: Janna Arch   Exercises  Seated March - 10 reps - 2 sets - 5 hold - 1x daily - 7x weekly  Seated Long Arc Quad - 10 reps - 2 sets - 5 hold - 1x daily - 7x weekly  Seated Hip Abduction with Resistance - 10 reps - 2 sets - 5 hold - 1x daily - 7x weekly                          PT Education - 12/03/17 1105    Education Details  stability, exercise technique     Person(s)  Educated  Patient    Methods  Explanation;Demonstration;Verbal cues    Comprehension  Verbalized understanding;Returned demonstration       PT Short Term Goals - 11/26/17 1812      PT SHORT TERM GOAL #1   Title  Patient will be independent with HEP in order to maintain gains made in therapy and decrease risk of falls.    Baseline  not initiated    Time  2    Period  Weeks    Status  New    Target Date  12/10/17        PT Long Term Goals - 11/26/17 1813      PT LONG TERM GOAL #1   Title  Pt will improve DGI by at least 6 points in order to demonstrate clinically significant improvement in balance and decreased risk for falls.    Baseline  11/26/17: 12/24    Time  8    Period  Weeks    Status  New    Target Date  01/21/18      PT LONG TERM GOAL #2   Title  Pt will improve BERG by at least 6 points in order to demonstrate clinically significant improvement in balance and decrease risk of falls.    Baseline  11/26/2017: 38/56    Time  8    Period   Weeks    Status  New    Target Date  01/21/18      PT LONG TERM GOAL #3   Title  Pt will improve ABC by at least 13% in order to demonstrate clinically significant improvement in balance confidence.     Baseline  11/26/2017: not taken    Time  8    Period  Weeks    Status  New    Target Date  01/21/18      PT LONG TERM GOAL #4   Title  Pt will decrease 5TSTS by at least 6 seconds in order to demonstrate clinically significant improvement in LE strength and decreased risk of falls.    Baseline  11/26/2017: 34 seconds (BUE support)    Time  8    Period  Weeks    Status  New    Target Date  01/21/18      PT LONG TERM GOAL #5   Title  Patient will improve gait speed as measured in 10MWT to >0.82 m/s comfortable speed and >1.0 m/s fast speed in order to be in the normative value ranges for her age group with the [place].     Baseline  11/26/2017: Self-selected: 24s = 0.42 m/s; Fastest: 19s = 0.53 m/s    Time  8    Period  Weeks    Status  New    Target Date  01/21/18      Additional Long Term Goals   Additional Long Term Goals  Yes      PT LONG TERM GOAL #6   Title  Patient will decrease TUG performed with LRAD to below 14 seconds in order to demonstrate decreased fall risk.    Baseline  11/26/2017: 20 seconds (SPC); 24 sec no AD    Time  8    Period  Weeks    Status  New    Target Date  01/21/18            Plan - 12/03/17 1157    Clinical Impression Statement  Patient educated and demonstrated understanding of new HEP as can be seen  in treatment note. Verbal cueing required intermittently to increase base of support to decrease instability with ambulation. Patient is fearful of stability interventions without UE support due to history of falling. Pt will benefit from skilled PT services to address deficits in balance and decrease risk for future falls.     Rehab Potential  Good    PT Frequency  1x / week    PT Duration  8 weeks    PT Treatment/Interventions  Canalith  Repostioning;Aquatic Therapy;Cryotherapy;Moist Heat;Ultrasound;Gait training;Stair training;Functional mobility training;Therapeutic activities;Therapeutic exercise;Balance training;Patient/family education;Neuromuscular re-education;Manual techniques;Spinal Manipulations;Joint Manipulations;Vestibular;Orthotic Fit/Training;Dry needling;Energy conservation;Electrical Stimulation;Iontophoresis 4mg /ml Dexamethasone;Splinting;Taping    PT Next Visit Plan  ABC Scale; BLE strengthening and balance    PT Home Exercise Plan  not provided at this time    Consulted and Agree with Plan of Care  Patient       Patient will benefit from skilled therapeutic intervention in order to improve the following deficits and impairments:  Abnormal gait, Improper body mechanics, Impaired sensation, Postural dysfunction, Decreased range of motion, Decreased strength, Decreased activity tolerance, Decreased endurance, Decreased balance, Difficulty walking, Pain  Visit Diagnosis: Difficulty in walking, not elsewhere classified  Muscle weakness (generalized)  History of falling     Problem List Patient Active Problem List   Diagnosis Date Noted  . Acute delirium 02/21/2017  . Alzheimer's dementia ([place]) 02/21/2017  . Acute encephalopathy 02/18/2017  . Acute metabolic encephalopathy 56/25/6389  . UTI (urinary tract infection) 12/24/2016  . Hemorrhoid   . History of colon cancer 04/05/2015  . Chronic anemia 03/10/2015  . Osteopenia 12/11/2014  . CKD (chronic kidney disease) stage 3, GFR 30-59 ml/min (HCC) 06/18/2014  . SDAT (senile dementia of Alzheimer's type) ([place]) 06/18/2014  . Acquired hypothyroidism 03/01/2014  . Essential hypertension 03/01/2014  . Mixed hyperlipidemia 03/01/2014   Janna Arch, PT, DPT   12/03/2017, 11:58 AM  [place] MAIN [facility] SERVICES [address] [place], [place], [address] Phone: [phone]   Fax:  [phone]  Name: Haley Brooks MRN: [id number] Date of Birth: [birth year]

## 2017-12-05 ENCOUNTER — Ambulatory Visit: Payer: Medicare Other

## 2017-12-10 ENCOUNTER — Ambulatory Visit: Payer: Medicare Other

## 2017-12-10 DIAGNOSIS — M6281 Muscle weakness (generalized): Secondary | ICD-10-CM

## 2017-12-10 DIAGNOSIS — Z9181 History of falling: Secondary | ICD-10-CM

## 2017-12-10 DIAGNOSIS — R262 Difficulty in walking, not elsewhere classified: Secondary | ICD-10-CM

## 2017-12-10 NOTE — Therapy (Signed)
Haley Brooks [facility] SERVICES [address] [place], [place], [address] Phone: [phone]   Fax:  [phone]  Physical Therapy Treatment  Patient Details  Name: Haley Brooks MRN: [id number] Date of Birth: 15-Dec-1931 Referring Provider (PT): Glendon Axe, MD   Encounter Date: 12/10/2017  PT End of Session - 12/10/17 0913    Visit Number  3    Number of Visits  9    Date for PT Re-Evaluation  01/21/18    Authorization Type  3/10 (PN 12/26/2017)    PT Start Time  0910    PT Stop Time  0955    PT Time Calculation (min)  45 min    Equipment Utilized During Treatment  Gait belt    Activity Tolerance  Patient tolerated treatment well    Behavior During Therapy  [facility] for tasks assessed/performed       Past Medical History:  Diagnosis Date  . Cancer [facility])    colon cancer   . Hemorrhoid   . Hypertension   . Thyroid disease     Past Surgical History:  Procedure Laterality Date  . COLON SURGERY    . FRACTURE SURGERY      There were no vitals filed for this visit.  Subjective Assessment - 12/10/17 0907    Subjective  Patient reports she has been doing her HEP everyday. Patient reports no stumbles or falls since seen last. Her daughter came and spent the night last night and brought her to PT today     Pertinent History  History of hypertension, hyperlipidemia, coronary artery disease s/p MI in 2006, hypothyroidism, osteoporosis, bilateral pulmonary embolism, previously on chronic warfarin therapy, h/o stroke in January 2014. Patient is currently on Eliquis. History of colon cancer treated in her 73s. Patient walks with a SPC.     Limitations  Lifting;Standing;Walking;House hold activities    How long can you sit comfortably?  n/a    How long can you stand comfortably?   >5 min w/ UE support    How long can you walk comfortably?  ~ 5 min w/ [facility]    Patient Stated Goals  feel more balanced; walk better; not be afraid of falling    Currently in Pain?  No/denies       Nustep lvl 3 3 minutes RPM>60 for cardiovascular support.   Treat   // bars  Airex pad: static stance 30 seconds: horizontal head turns 2x 60 seconds ;  Step over orange hurdle SUE support 10x each LE, verbal cues for increasing hip flexion.   Side step over orange hurdle BUE support 10x each LE, focus on body awareness in space.    Marching with half foam roller between feet to promote weider BOS              Standing:    Ambulate in hallway with vertical head turns: 2x 86 ft with cues for widening BOS with SPC and CGA.    Ambulate in hallway with horizontal head turns 2x 86 ft with SPC and CGA  Matrix resisted weight machine: 7.5lb : forward, lateral stepping (both directions) and backwards, 3 trials each direction; max verbal cueing cueing for body  Mechanics and alignment.                         PT Education - 12/10/17 0913    Education Details  stability, exercise technique     Person(s) Educated  Patient  Methods  Explanation;Demonstration;Verbal cues    Comprehension  Verbalized understanding;Returned demonstration       PT Short Term Goals - 11/26/17 1812      PT SHORT TERM GOAL #1   Title  Patient will be independent with HEP in order to maintain gains made in therapy and decrease risk of falls.    Baseline  not initiated    Time  2    Period  Weeks    Status  New    Target Date  12/10/17        PT Long Term Goals - 11/26/17 1813      PT LONG TERM GOAL #1   Title  Pt will improve DGI by at least 6 points in order to demonstrate clinically significant improvement in balance and decreased risk for falls.    Baseline  11/26/17: 12/24    Time  8    Period  Weeks    Status  New    Target Date  01/21/18      PT LONG TERM GOAL #2   Title  Pt will improve BERG by at least 6 points in order to demonstrate clinically significant improvement in balance and decrease risk of falls.    Baseline   11/26/2017: 38/56    Time  8    Period  Weeks    Status  New    Target Date  01/21/18      PT LONG TERM GOAL #3   Title  Pt will improve ABC by at least 13% in order to demonstrate clinically significant improvement in balance confidence.     Baseline  11/26/2017: not taken    Time  8    Period  Weeks    Status  New    Target Date  01/21/18      PT LONG TERM GOAL #4   Title  Pt will decrease 5TSTS by at least 6 seconds in order to demonstrate clinically significant improvement in LE strength and decreased risk of falls.    Baseline  11/26/2017: 34 seconds (BUE support)    Time  8    Period  Weeks    Status  New    Target Date  01/21/18      PT LONG TERM GOAL #5   Title  Patient will improve gait speed as measured in 10MWT to >0.82 m/s comfortable speed and >1.0 m/s fast speed in order to be in the normative value ranges for her age group with the [place].     Baseline  11/26/2017: Self-selected: 24s = 0.42 m/s; Fastest: 19s = 0.53 m/s    Time  8    Period  Weeks    Status  New    Target Date  01/21/18      Additional Long Term Goals   Additional Long Term Goals  Yes      PT LONG TERM GOAL #6   Title  Patient will decrease TUG performed with LRAD to below 14 seconds in order to demonstrate decreased fall risk.    Baseline  11/26/2017: 20 seconds (SPC); 24 sec no AD    Time  8    Period  Weeks    Status  New    Target Date  01/21/18            Plan - 12/10/17 2703    Clinical Impression Statement  Patient is challenged with body awareness in space requiring frequent cueing for alignment and body mechanics. Patient requires  cues for task orientation and sequencing when she becomes disoriented/distracted. Patient continues to have narrow BOS that improves with verbal cueing. Pt will benefit from skilled PT services to address deficits in balance and decrease risk for future falls.     Rehab Potential  Good    PT Frequency  1x / week    PT Duration  8 weeks    PT  Treatment/Interventions  Canalith Repostioning;Aquatic Therapy;Cryotherapy;Moist Heat;Ultrasound;Gait training;Stair training;Functional mobility training;Therapeutic activities;Therapeutic exercise;Balance training;Patient/family education;Neuromuscular re-education;Manual techniques;Spinal Manipulations;Joint Manipulations;Vestibular;Orthotic Fit/Training;Dry needling;Energy conservation;Electrical Stimulation;Iontophoresis 4mg /ml Dexamethasone;Splinting;Taping    PT Next Visit Plan  ABC Scale; BLE strengthening and balance    PT Home Exercise Plan  not provided at this time    Consulted and Agree with Plan of Care  Patient       Patient will benefit from skilled therapeutic intervention in order to improve the following deficits and impairments:  Abnormal gait, Improper body mechanics, Impaired sensation, Postural dysfunction, Decreased range of motion, Decreased strength, Decreased activity tolerance, Decreased endurance, Decreased balance, Difficulty walking, Pain  Visit Diagnosis: Difficulty in walking, not elsewhere classified  Muscle weakness (generalized)  History of falling     Problem List Patient Active Problem List   Diagnosis Date Noted  . Acute delirium 02/21/2017  . Alzheimer's dementia ([place]) 02/21/2017  . Acute encephalopathy 02/18/2017  . Acute metabolic encephalopathy 71/06/2692  . UTI (urinary tract infection) 12/24/2016  . Hemorrhoid   . History of colon cancer 04/05/2015  . Chronic anemia 03/10/2015  . Osteopenia 12/11/2014  . CKD (chronic kidney disease) stage 3, GFR 30-59 ml/min (HCC) 06/18/2014  . SDAT (senile dementia of Alzheimer's type) ([place]) 06/18/2014  . Acquired hypothyroidism 03/01/2014  . Essential hypertension 03/01/2014  . Mixed hyperlipidemia 03/01/2014   Janna Arch, PT, DPT   12/10/2017, 9:59 AM  [place] [facility] SERVICES [address] [place], [place], [address] Phone: [phone]   Fax:   [phone]  Name: ZARRIA TOWELL MRN: [id number] Date of Birth: 09/10/1931

## 2017-12-12 ENCOUNTER — Ambulatory Visit: Payer: Medicare Other

## 2017-12-17 ENCOUNTER — Ambulatory Visit: Payer: Medicare Other

## 2017-12-17 DIAGNOSIS — Z9181 History of falling: Secondary | ICD-10-CM

## 2017-12-17 DIAGNOSIS — R262 Difficulty in walking, not elsewhere classified: Secondary | ICD-10-CM

## 2017-12-17 DIAGNOSIS — M6281 Muscle weakness (generalized): Secondary | ICD-10-CM

## 2017-12-17 NOTE — Therapy (Signed)
[place] MAIN [facility] SERVICES [address] [place], [place], [address] Phone: [phone]   Fax:  [phone]  Physical Therapy Treatment  Patient Details  Name: Haley Brooks MRN: [id number] Date of Birth: 23-Apr-1931 Referring Provider (PT): Glendon Axe, MD   Encounter Date: 12/17/2017  PT End of Session - 12/17/17 1437    Visit Number  4    Number of Visits  9    Date for PT Re-Evaluation  01/21/18    Authorization Type  4/10 (PN 12/26/2017)    PT Start Time  1430    PT Stop Time  1515    PT Time Calculation (min)  45 min    Equipment Utilized During Treatment  Gait belt    Activity Tolerance  Patient tolerated treatment well    Behavior During Therapy  [facility] for tasks assessed/performed       Past Medical History:  Diagnosis Date  . Cancer [facility])    colon cancer   . Hemorrhoid   . Hypertension   . Thyroid disease     Past Surgical History:  Procedure Laterality Date  . COLON SURGERY    . FRACTURE SURGERY      There were no vitals filed for this visit.  Subjective Assessment - 12/17/17 1435    Subjective  Patient presents with new cane,  a [place] walking stick that her daughter gave her. Reports no stumbles or falls. Her daughter brought her to therapy today. Reports compliance with HEP.     Pertinent History  History of hypertension, hyperlipidemia, coronary artery disease s/p MI in 2006, hypothyroidism, osteoporosis, bilateral pulmonary embolism, previously on chronic warfarin therapy, h/o stroke in January 2014. Patient is currently on Eliquis. History of colon cancer treated in her 37s. Patient walks with a SPC.     Limitations  Lifting;Standing;Walking;House hold activities    How long can you sit comfortably?  n/a    How long can you stand comfortably?   >5 min w/ UE support    How long can you walk comfortably?  ~ 5 min w/ [facility]    Patient Stated Goals  feel more balanced; walk better; not be afraid of falling     Currently in Pain?  No/denies       Nustep lvl 3 3 minutes RPM>60 for cardiovascular support.    Treatment   // bars    Step over orange hurdle SUE support 10x each LE, verbal cues for increasing hip flexion.    Side step over orange hurdle BUE support 10x each LE, focus on body awareness in space.    Marching with half foam roller between feet to promote wider BOS  Ambulate in // bars with 2x4 between feet to increase BOS; 6x  Ambulate without 2x4 maintaining widened BOS  6" step toe taps with half foam roller between feet to increase BOS for stability 10x each LE  4" step eccentric heel taps 12x each LE. BUE support; verbal cues for heel strike   Airex pad: saebo ball transfer 3 minutes  Tapping balloon inside and outside BOS without UE support no LOB  Modified single limb stance: one foot on soccer ball 2x 30 seconds with SUE support   Large step and clap 10x each LE.    Ambulate in hallway with vertical head turns: 160 ft with cues for widening BOS with walking stick and CGA.    Ambulate in hallway with horizontal head turns 1160 ft with walking stick  and CGA   Adjust walking cane down 2 inches for proper height, max cueing with ambulation for widening BOS                       PT Education - 12/17/17 1436    Education Details  exercise technique, stability     Person(s) Educated  Patient    Methods  Explanation;Demonstration;Tactile cues;Verbal cues    Comprehension  Verbalized understanding;Returned demonstration;Tactile cues required;Need further instruction;Verbal cues required       PT Short Term Goals - 11/26/17 1812      PT SHORT TERM GOAL #1   Title  Patient will be independent with HEP in order to maintain gains made in therapy and decrease risk of falls.    Baseline  not initiated    Time  2    Period  Weeks    Status  New    Target Date  12/10/17        PT Long Term Goals - 11/26/17 1813      PT LONG TERM GOAL #1    Title  Pt will improve DGI by at least 6 points in order to demonstrate clinically significant improvement in balance and decreased risk for falls.    Baseline  11/26/17: 12/24    Time  8    Period  Weeks    Status  New    Target Date  01/21/18      PT LONG TERM GOAL #2   Title  Pt will improve BERG by at least 6 points in order to demonstrate clinically significant improvement in balance and decrease risk of falls.    Baseline  11/26/2017: 38/56    Time  8    Period  Weeks    Status  New    Target Date  01/21/18      PT LONG TERM GOAL #3   Title  Pt will improve ABC by at least 13% in order to demonstrate clinically significant improvement in balance confidence.     Baseline  11/26/2017: not taken    Time  8    Period  Weeks    Status  New    Target Date  01/21/18      PT LONG TERM GOAL #4   Title  Pt will decrease 5TSTS by at least 6 seconds in order to demonstrate clinically significant improvement in LE strength and decreased risk of falls.    Baseline  11/26/2017: 34 seconds (BUE support)    Time  8    Period  Weeks    Status  New    Target Date  01/21/18      PT LONG TERM GOAL #5   Title  Patient will improve gait speed as measured in 10MWT to >0.82 m/s comfortable speed and >1.0 m/s fast speed in order to be in the normative value ranges for her age group with the [place].     Baseline  11/26/2017: Self-selected: 24s = 0.42 m/s; Fastest: 19s = 0.53 m/s    Time  8    Period  Weeks    Status  New    Target Date  01/21/18      Additional Long Term Goals   Additional Long Term Goals  Yes      PT LONG TERM GOAL #6   Title  Patient will decrease TUG performed with LRAD to below 14 seconds in order to demonstrate decreased fall risk.    Baseline  11/26/2017: 20 seconds (SPC); 24 sec no AD    Time  8    Period  Weeks    Status  New    Target Date  01/21/18            Plan - 12/17/17 1556    Clinical Impression Statement  Patient presents with new cane, a  "Megan [place]on Walking stick" which required adjustment for correct height. Patient continues to be challenged with base of support, frequently regressing to narrow scissoring gait. Use of visual cues such as 2x4 between feet assists in short term carryover however will require more frequent attempts due to patient cognitive status/memory impairments. Pt will benefit from skilled PT services to address deficits in balance and decrease risk for future falls    Rehab Potential  Good    PT Frequency  1x / week    PT Duration  8 weeks    PT Treatment/Interventions  Canalith Repostioning;Aquatic Therapy;Cryotherapy;Moist Heat;Ultrasound;Gait training;Stair training;Functional mobility training;Therapeutic activities;Therapeutic exercise;Balance training;Patient/family education;Neuromuscular re-education;Manual techniques;Spinal Manipulations;Joint Manipulations;Vestibular;Orthotic Fit/Training;Dry needling;Energy conservation;Electrical Stimulation;Iontophoresis 4mg /ml Dexamethasone;Splinting;Taping    PT Next Visit Plan  ABC Scale; BLE strengthening and balance    PT Home Exercise Plan  not provided at this time    Consulted and Agree with Plan of Care  Patient       Patient will benefit from skilled therapeutic intervention in order to improve the following deficits and impairments:  Abnormal gait, Improper body mechanics, Impaired sensation, Postural dysfunction, Decreased range of motion, Decreased strength, Decreased activity tolerance, Decreased endurance, Decreased balance, Difficulty walking, Pain  Visit Diagnosis: Difficulty in walking, not elsewhere classified  Muscle weakness (generalized)  History of falling     Problem List Patient Active Problem List   Diagnosis Date Noted  . Acute delirium 02/21/2017  . Alzheimer's dementia ([place]) 02/21/2017  . Acute encephalopathy 02/18/2017  . Acute metabolic encephalopathy 06/24/7626  . UTI (urinary tract infection) 12/24/2016  . Hemorrhoid    . History of colon cancer 04/05/2015  . Chronic anemia 03/10/2015  . Osteopenia 12/11/2014  . CKD (chronic kidney disease) stage 3, GFR 30-59 ml/min (HCC) 06/18/2014  . SDAT (senile dementia of Alzheimer's type) ([place]) 06/18/2014  . Acquired hypothyroidism 03/01/2014  . Essential hypertension 03/01/2014  . Mixed hyperlipidemia 03/01/2014   Janna Arch, PT, DPT   12/17/2017, 4:08 PM  [place] MAIN [facility] SERVICES [address] [place], [place], [address] Phone: [phone]   Fax:  [phone]  Name: LIZZA HUFFAKER MRN: [id number] Date of Birth: 1931-04-29

## 2017-12-19 ENCOUNTER — Ambulatory Visit: Payer: Medicare Other

## 2017-12-24 ENCOUNTER — Ambulatory Visit: Payer: Medicare Other

## 2017-12-24 DIAGNOSIS — R262 Difficulty in walking, not elsewhere classified: Secondary | ICD-10-CM

## 2017-12-24 DIAGNOSIS — Z9181 History of falling: Secondary | ICD-10-CM

## 2017-12-24 DIAGNOSIS — M6281 Muscle weakness (generalized): Secondary | ICD-10-CM

## 2017-12-24 NOTE — Therapy (Signed)
[place] MAIN [facility] SERVICES [address] [place], [place], [address] Phone: [phone]   Fax:  [phone]  Physical Therapy Treatment  Patient Details  Name: Haley Brooks MRN: [id number] Date of Birth: 1931/09/12 Referring Provider (PT): Glendon Axe, MD   Encounter Date: 12/24/2017  PT End of Session - 12/24/17 1104    Visit Number  5    Number of Visits  9    Date for PT Re-Evaluation  01/21/18    Authorization Type  5/10 (PN 12/26/2017)    PT Start Time  1100    PT Stop Time  1145    PT Time Calculation (min)  45 min    Equipment Utilized During Treatment  Gait belt    Activity Tolerance  Patient tolerated treatment well    Behavior During Therapy  [facility] for tasks assessed/performed       Past Medical History:  Diagnosis Date  . Cancer [facility])    colon cancer   . Hemorrhoid   . Hypertension   . Thyroid disease     Past Surgical History:  Procedure Laterality Date  . COLON SURGERY    . FRACTURE SURGERY      There were no vitals filed for this visit.  Subjective Assessment - 12/24/17 1103    Subjective  Patient had to walk down to therapy rather than use her usual chair due to driving herself today. Reports no stumbles or falls.     Pertinent History  History of hypertension, hyperlipidemia, coronary artery disease s/p MI in 2006, hypothyroidism, osteoporosis, bilateral pulmonary embolism, previously on chronic warfarin therapy, h/o stroke in January 2014. Patient is currently on Eliquis. History of colon cancer treated in her 107s. Patient walks with a SPC.     Limitations  Lifting;Standing;Walking;House hold activities    How long can you sit comfortably?  n/a    How long can you stand comfortably?   >5 min w/ UE support    How long can you walk comfortably?  ~ 5 min w/ [facility]    Patient Stated Goals  feel more balanced; walk better; not be afraid of falling    Currently in Pain?  No/denies       Nustep lvl 3 3  minutes RPM>60 for cardiovascular support.    Treatment   // bars   Airex pad: modified tandem stance: one foot on each airex pad: tossing balls into hoop  Airex pad: Tapping balloon inside and outside BOS without UE support no LOB  Airex pad: 6" step toe taps with SUE support 10x each LE   Airex pad: side toe tap 6" step; SUE support 10x each LE   Step over orange hurdle SUE support 10x each LE, verbal cues for increasing hip flexion.    Side step over orange hurdle BUE support 10x each LE, focus on body awareness in space.    Half foam roller: df pf taps 2 minutes   4" step eccentric heel taps 12x each LE. BUE support; verbal cues for heel strike    Large step and clap 10x each LE.    Ambulate in hallway with vertical head turns: 160 ft with cues for widening BOS with walking stick and CGA.    Ambulate in hallway with horizontal head turns 160 ft with walking stick and CGA    RTB: ankle pf: : inversion,  10x each direction  PT Education - 12/24/17 1104    Education Details  exercise technique, stability     Person(s) Educated  Patient    Methods  Explanation;Demonstration;Tactile cues;Verbal cues    Comprehension  Verbalized understanding;Returned demonstration;Tactile cues required;Need further instruction;Verbal cues required       PT Short Term Goals - 11/26/17 1812      PT SHORT TERM GOAL #1   Title  Patient will be independent with HEP in order to maintain gains made in therapy and decrease risk of falls.    Baseline  not initiated    Time  2    Period  Weeks    Status  New    Target Date  12/10/17        PT Long Term Goals - 11/26/17 1813      PT LONG TERM GOAL #1   Title  Pt will improve DGI by at least 6 points in order to demonstrate clinically significant improvement in balance and decreased risk for falls.    Baseline  11/26/17: 12/24    Time  8    Period  Weeks    Status  New    Target Date  01/21/18       PT LONG TERM GOAL #2   Title  Pt will improve BERG by at least 6 points in order to demonstrate clinically significant improvement in balance and decrease risk of falls.    Baseline  11/26/2017: 38/56    Time  8    Period  Weeks    Status  New    Target Date  01/21/18      PT LONG TERM GOAL #3   Title  Pt will improve ABC by at least 13% in order to demonstrate clinically significant improvement in balance confidence.     Baseline  11/26/2017: not taken    Time  8    Period  Weeks    Status  New    Target Date  01/21/18      PT LONG TERM GOAL #4   Title  Pt will decrease 5TSTS by at least 6 seconds in order to demonstrate clinically significant improvement in LE strength and decreased risk of falls.    Baseline  11/26/2017: 34 seconds (BUE support)    Time  8    Period  Weeks    Status  New    Target Date  01/21/18      PT LONG TERM GOAL #5   Title  Patient will improve gait speed as measured in 10MWT to >0.82 m/s comfortable speed and >1.0 m/s fast speed in order to be in the normative value ranges for her age group with the [place].     Baseline  11/26/2017: Self-selected: 24s = 0.42 m/s; Fastest: 19s = 0.53 m/s    Time  8    Period  Weeks    Status  New    Target Date  01/21/18      Additional Long Term Goals   Additional Long Term Goals  Yes      PT LONG TERM GOAL #6   Title  Patient will decrease TUG performed with LRAD to below 14 seconds in order to demonstrate decreased fall risk.    Baseline  11/26/2017: 20 seconds (SPC); 24 sec no AD    Time  8    Period  Weeks    Status  New    Target Date  01/21/18  Plan - 12/24/17 1133    Clinical Impression Statement  Patient has occasional episodes of collapse of bilateral feet resulting in decreased ankle response/stability requiring trunk reactions for episodes of LOB. Patient continues to be challenged by unstable surfaces and widening BOS. Pt will benefit from skilled PT services to address deficits in  balance and decrease risk for future falls    Rehab Potential  Good    PT Frequency  1x / week    PT Duration  8 weeks    PT Treatment/Interventions  Canalith Repostioning;Aquatic Therapy;Cryotherapy;Moist Heat;Ultrasound;Gait training;Stair training;Functional mobility training;Therapeutic activities;Therapeutic exercise;Balance training;Patient/family education;Neuromuscular re-education;Manual techniques;Spinal Manipulations;Joint Manipulations;Vestibular;Orthotic Fit/Training;Dry needling;Energy conservation;Electrical Stimulation;Iontophoresis 4mg /ml Dexamethasone;Splinting;Taping    PT Next Visit Plan  ABC Scale; BLE strengthening and balance    PT Home Exercise Plan  not provided at this time    Consulted and Agree with Plan of Care  Patient       Patient will benefit from skilled therapeutic intervention in order to improve the following deficits and impairments:  Abnormal gait, Improper body mechanics, Impaired sensation, Postural dysfunction, Decreased range of motion, Decreased strength, Decreased activity tolerance, Decreased endurance, Decreased balance, Difficulty walking, Pain  Visit Diagnosis: Difficulty in walking, not elsewhere classified  Muscle weakness (generalized)  History of falling     Problem List Patient Active Problem List   Diagnosis Date Noted  . Acute delirium 02/21/2017  . Alzheimer's dementia ([place]) 02/21/2017  . Acute encephalopathy 02/18/2017  . Acute metabolic encephalopathy 36/62/9476  . UTI (urinary tract infection) 12/24/2016  . Hemorrhoid   . History of colon cancer 04/05/2015  . Chronic anemia 03/10/2015  . Osteopenia 12/11/2014  . CKD (chronic kidney disease) stage 3, GFR 30-59 ml/min (HCC) 06/18/2014  . SDAT (senile dementia of Alzheimer's type) ([place]) 06/18/2014  . Acquired hypothyroidism 03/01/2014  . Essential hypertension 03/01/2014  . Mixed hyperlipidemia 03/01/2014   Janna Arch, PT, DPT   12/24/2017, 11:57 AM  [place] MAIN [facility] SERVICES [address] [place], [place], [address] Phone: [phone]   Fax:  [phone]  Name: Haley Brooks MRN: [id number] Date of Birth: 1931-07-24

## 2017-12-26 ENCOUNTER — Ambulatory Visit: Payer: Medicare Other

## 2017-12-31 ENCOUNTER — Ambulatory Visit: Payer: Medicare Other

## 2017-12-31 DIAGNOSIS — M6281 Muscle weakness (generalized): Secondary | ICD-10-CM

## 2017-12-31 DIAGNOSIS — R262 Difficulty in walking, not elsewhere classified: Secondary | ICD-10-CM | POA: Diagnosis not present

## 2017-12-31 DIAGNOSIS — Z9181 History of falling: Secondary | ICD-10-CM

## 2017-12-31 NOTE — Therapy (Signed)
[place] MAIN [facility] SERVICES [address] [place], [place], [address] Phone: [phone]   Fax:  [phone]  Physical Therapy Treatment  Patient Details  Name: Haley Brooks MRN: [id number] Date of Birth: [birth year] Referring Provider (PT): Glendon Axe, MD   Encounter Date: 12/31/2017  PT End of Session - 12/31/17 1105    Visit Number  6    Number of Visits  9    Date for PT Re-Evaluation  01/21/18    Authorization Type  6/10 (PN 12/26/2017)    PT Start Time  1100    PT Stop Time  1145    PT Time Calculation (min)  45 min    Equipment Utilized During Treatment  Gait belt    Activity Tolerance  Patient tolerated treatment well    Behavior During Therapy  [facility] for tasks assessed/performed       Past Medical History:  Diagnosis Date  . Cancer [facility])    colon cancer   . Hemorrhoid   . Hypertension   . Thyroid disease     Past Surgical History:  Procedure Laterality Date  . COLON SURGERY    . FRACTURE SURGERY      There were no vitals filed for this visit.  Subjective Assessment - 12/31/17 1104    Subjective  Pt has no complaints at start of session, no new medications, falls or stumbles or pain. Reported good compliance with HEP.    Pertinent History  History of hypertension, hyperlipidemia, coronary artery disease s/p MI in 2006, hypothyroidism, osteoporosis, bilateral pulmonary embolism, previously on chronic warfarin therapy, h/o stroke in January 2014. Patient is currently on Eliquis. History of colon cancer treated in her 35s. Patient walks with a SPC.     Limitations  Lifting;Standing;Walking;House hold activities    How long can you sit comfortably?  n/a    How long can you stand comfortably?   >5 min w/ UE support    How long can you walk comfortably?  ~ 5 min w/ [facility]    Patient Stated Goals  feel more balanced; walk better; not be afraid of falling    Currently in Pain?  No/denies       Nustep lvl 3 x5 minutes  RPM>60 for cardiovascular support.    Treatment   // bars   Sidestepping with focus on LE maintain neutral position (pt tends to turn trunk and ER LE towards side) x5 lengths CGA/close supervision  Airex pad: modified tandem stance on 1 foam 2x ea side 15 ball circles (no UE support) CGA   Airex pad: Tapping balloon inside and outside BOS without UE support, multiple LOB noted, 1-2x for PT to correct, CGA-minAx1   Airex pad: 6" step toe taps with SUE support 10x each LE  Unable to perform without UE support, CGA   Airex pad: side toe tap 6" step; SUE support 10x each LE unable to perform without UE support, CGA   Step over orange hurdle SUE support 10x each LE, verbal cues for increasing hip flexion, 1-2 instances of toe catching CGA/supervision   Side step over orange hurdle BUE support 10x each LE, focus on body awareness in space, CGA   Half foam roller: df pf taps 2 minutes with UE support   Ambulate in hallway with vertical head turns: 160 ft with cues for widening BOS with walking stick and CGA   Ambulate in hallway with horizontal head turns 160 ft with walking stick and CGA  Seated rest breaks 2-3x during session, no complaints of pain, mild complaints of fatigue     PT Education - 12/31/17 1105    Education Details  exercise technique/form    Person(s) Educated  Patient    Methods  Explanation;Demonstration;Tactile cues    Comprehension  Verbalized understanding;Returned demonstration       PT Short Term Goals - 11/26/17 1812      PT SHORT TERM GOAL #1   Title  Patient will be independent with HEP in order to maintain gains made in therapy and decrease risk of falls.    Baseline  not initiated    Time  2    Period  Weeks    Status  New    Target Date  12/10/17        PT Long Term Goals - 11/26/17 1813      PT LONG TERM GOAL #1   Title  Pt will improve DGI by at least 6 points in order to demonstrate clinically significant improvement in balance and  decreased risk for falls.    Baseline  11/26/17: 12/24    Time  8    Period  Weeks    Status  New    Target Date  01/21/18      PT LONG TERM GOAL #2   Title  Pt will improve BERG by at least 6 points in order to demonstrate clinically significant improvement in balance and decrease risk of falls.    Baseline  11/26/2017: 38/56    Time  8    Period  Weeks    Status  New    Target Date  01/21/18      PT LONG TERM GOAL #3   Title  Pt will improve ABC by at least 13% in order to demonstrate clinically significant improvement in balance confidence.     Baseline  11/26/2017: not taken    Time  8    Period  Weeks    Status  New    Target Date  01/21/18      PT LONG TERM GOAL #4   Title  Pt will decrease 5TSTS by at least 6 seconds in order to demonstrate clinically significant improvement in LE strength and decreased risk of falls.    Baseline  11/26/2017: 34 seconds (BUE support)    Time  8    Period  Weeks    Status  New    Target Date  01/21/18      PT LONG TERM GOAL #5   Title  Patient will improve gait speed as measured in 10MWT to >0.82 m/s comfortable speed and >1.0 m/s fast speed in order to be in the normative value ranges for her age group with the [place].     Baseline  11/26/2017: Self-selected: 24s = 0.42 m/s; Fastest: 19s = 0.53 m/s    Time  8    Period  Weeks    Status  New    Target Date  01/21/18      Additional Long Term Goals   Additional Long Term Goals  Yes      PT LONG TERM GOAL #6   Title  Patient will decrease TUG performed with LRAD to below 14 seconds in order to demonstrate decreased fall risk.    Baseline  11/26/2017: 20 seconds (SPC); 24 sec no AD    Time  8    Period  Weeks    Status  New    Target Date  01/21/18            Plan - 12/31/17 1147    Clinical Impression Statement  Patient had most difficulty with RLE coordination, need verbal and visual cues to attend to leg positioning throughout session. Session focused on promoting balance  strategies especially on uneven surfaces. CGA to very close supervision throughout session. Pt exhibited difficulty maintaining gait velocity with head turns, verbal cued for stride length.     Rehab Potential  Good    PT Frequency  1x / week    PT Duration  8 weeks    PT Treatment/Interventions  Canalith Repostioning;Aquatic Therapy;Cryotherapy;Moist Heat;Ultrasound;Gait training;Stair training;Functional mobility training;Therapeutic activities;Therapeutic exercise;Balance training;Patient/family education;Neuromuscular re-education;Manual techniques;Spinal Manipulations;Joint Manipulations;Vestibular;Orthotic Fit/Training;Dry needling;Energy conservation;Electrical Stimulation;Iontophoresis 4mg /ml Dexamethasone;Splinting;Taping    PT Next Visit Plan  ABC Scale; BLE strengthening and balance    PT Home Exercise Plan  not provided at this time    Consulted and Agree with Plan of Care  Patient       Patient will benefit from skilled therapeutic intervention in order to improve the following deficits and impairments:  Abnormal gait, Improper body mechanics, Impaired sensation, Postural dysfunction, Decreased range of motion, Decreased strength, Decreased activity tolerance, Decreased endurance, Decreased balance, Difficulty walking, Pain  Visit Diagnosis: Difficulty in walking, not elsewhere classified  Muscle weakness (generalized)  History of falling     Problem List Patient Active Problem List   Diagnosis Date Noted  . Acute delirium 02/21/2017  . Alzheimer's dementia ([place]) 02/21/2017  . Acute encephalopathy 02/18/2017  . Acute metabolic encephalopathy 78/58/8502  . UTI (urinary tract infection) 12/24/2016  . Hemorrhoid   . History of colon cancer 04/05/2015  . Chronic anemia 03/10/2015  . Osteopenia 12/11/2014  . CKD (chronic kidney disease) stage 3, GFR 30-59 ml/min (HCC) 06/18/2014  . SDAT (senile dementia of Alzheimer's type) ([place]) 06/18/2014  . Acquired hypothyroidism  03/01/2014  . Essential hypertension 03/01/2014  . Mixed hyperlipidemia 03/01/2014   Lieutenant Diego PT, DPT 11:57 AM,12/31/17 [place] MAIN [facility] SERVICES [address] [place], [place], [address] Phone: [phone]   Fax:  [phone]  Name: Haley Brooks MRN: [id number] Date of Birth: 09-03-1931

## 2018-01-02 ENCOUNTER — Ambulatory Visit: Payer: Medicare Other

## 2018-01-08 ENCOUNTER — Ambulatory Visit: Payer: Medicare Other | Attending: Internal Medicine | Admitting: Physical Therapy

## 2018-01-08 ENCOUNTER — Encounter: Payer: Self-pay | Admitting: Physical Therapy

## 2018-01-08 DIAGNOSIS — Z9181 History of falling: Secondary | ICD-10-CM

## 2018-01-08 DIAGNOSIS — R262 Difficulty in walking, not elsewhere classified: Secondary | ICD-10-CM | POA: Diagnosis present

## 2018-01-08 DIAGNOSIS — M6281 Muscle weakness (generalized): Secondary | ICD-10-CM | POA: Insufficient documentation

## 2018-01-08 NOTE — Therapy (Signed)
[place] MAIN [facility] SERVICES [address] [place], [place], [address] Phone: [phone]   Fax:  [phone]  Physical Therapy Treatment  Patient Details  Name: Haley Brooks MRN: [id number] Date of Birth: [birth year] Referring Provider (PT): Glendon Axe, MD   Encounter Date: 01/08/2018  PT End of Session - 01/08/18 1524    Visit Number  7    Number of Visits  9    Date for PT Re-Evaluation  01/21/18    Authorization Type  7/10 (PN 12/26/2017)    PT Start Time  0320    PT Stop Time  0400    PT Time Calculation (min)  40 min    Equipment Utilized During Treatment  Gait belt    Activity Tolerance  Patient tolerated treatment well    Behavior During Therapy  [facility] for tasks assessed/performed       Past Medical History:  Diagnosis Date  . Cancer [facility])    colon cancer   . Hemorrhoid   . Hypertension   . Thyroid disease     Past Surgical History:  Procedure Laterality Date  . COLON SURGERY    . FRACTURE SURGERY      There were no vitals filed for this visit.  Subjective Assessment - 01/08/18 1525    Subjective  Pt has no complaints at start of session, no new medications, falls or stumbles or pain. Reported good compliance with HEP.    Pertinent History  History of hypertension, hyperlipidemia, coronary artery disease s/p MI in 2006, hypothyroidism, osteoporosis, bilateral pulmonary embolism, previously on chronic warfarin therapy, h/o stroke in January 2014. Patient is currently on Eliquis. History of colon cancer treated in her 58s. Patient walks with a SPC.     Limitations  Lifting;Standing;Walking;House hold activities    How long can you sit comfortably?  n/a    How long can you stand comfortably?   >5 min w/ UE support    How long can you walk comfortably?  ~ 5 min w/ [facility]    Patient Stated Goals  feel more balanced; walk better; not be afraid of falling    Currently in Pain?  No/denies    Multiple Pain Sites  No       Treatment:  Heel raises x20 reps bilaterally, BUE support in // bars, supervision for safety Marching x15 reps each leg, BUE support in // bars, supervision for safety Tandem stance, eyes open, x10 sec holds with each foot in rear x2 bouts each foot, supervision for safety, no UE support Tandem stance, eyes closed, x10 sec holds with each foot in rear x2 bouts each foot, supervision for safety, no UE support Side stepping on balance foam x 5 laps with 100% UE support   Four square: Fwd / bwd steps, side to side steps ,  CGA for safety, VCs to take big enough steps and to try to increase speed to work on coordination  Diagonal stepping CGA for safety, VCs for taking a big enough step to get both feet into the square  Gait out in hallway, CGA for all activities with VCs for maintaining gait speed and step length with activities: Horizontal head turns x160 ft, calling out cards as walking to give point of focus Vertical head turns x160 ft Direction changes x80 ft, some difficulty with changing directions quickly and beginning to walk backwards   VCs for proper technique and positioning for each exercise  PT Education - 01/08/18 1523    Education Details  hep    Person(s) Educated  Patient    Methods  Explanation    Comprehension  Verbalized understanding;Need further instruction       PT Short Term Goals - 11/26/17 1812      PT SHORT TERM GOAL #1   Title  Patient will be independent with HEP in order to maintain gains made in therapy and decrease risk of falls.    Baseline  not initiated    Time  2    Period  Weeks    Status  New    Target Date  12/10/17        PT Long Term Goals - 11/26/17 1813      PT LONG TERM GOAL #1   Title  Pt will improve DGI by at least 6 points in order to demonstrate clinically significant improvement in balance and decreased risk for falls.    Baseline  11/26/17: 12/24    Time  8    Period  Weeks     Status  New    Target Date  01/21/18      PT LONG TERM GOAL #2   Title  Pt will improve BERG by at least 6 points in order to demonstrate clinically significant improvement in balance and decrease risk of falls.    Baseline  11/26/2017: 38/56    Time  8    Period  Weeks    Status  New    Target Date  01/21/18      PT LONG TERM GOAL #3   Title  Pt will improve ABC by at least 13% in order to demonstrate clinically significant improvement in balance confidence.     Baseline  11/26/2017: not taken    Time  8    Period  Weeks    Status  New    Target Date  01/21/18      PT LONG TERM GOAL #4   Title  Pt will decrease 5TSTS by at least 6 seconds in order to demonstrate clinically significant improvement in LE strength and decreased risk of falls.    Baseline  11/26/2017: 34 seconds (BUE support)    Time  8    Period  Weeks    Status  New    Target Date  01/21/18      PT LONG TERM GOAL #5   Title  Patient will improve gait speed as measured in 10MWT to >0.82 m/s comfortable speed and >1.0 m/s fast speed in order to be in the normative value ranges for her age group with the [place].     Baseline  11/26/2017: Self-selected: 24s = 0.42 m/s; Fastest: 19s = 0.53 m/s    Time  8    Period  Weeks    Status  New    Target Date  01/21/18      Additional Long Term Goals   Additional Long Term Goals  Yes      PT LONG TERM GOAL #6   Title  Patient will decrease TUG performed with LRAD to below 14 seconds in order to demonstrate decreased fall risk.    Baseline  11/26/2017: 20 seconds (SPC); 24 sec no AD    Time  8    Period  Weeks    Status  New    Target Date  01/21/18            Plan - 01/08/18 1526  Clinical Impression Statement  Patient demonstrates LOB with standing balance exercises indicating decreased balancing strategies. Patient did require UE support to perform sidestepping up and over exercise. Patient will benefit from further skilled therapy to return to prior level of  function. .  Pt was encouraged to perform HEP during the week in order to continue progressing balance and strength interventions.  Pt would continue to benefit from skilled therapy services in order to further address LE strength deficits and balance deficits in order to decrease fall risk and improve mobility.    Rehab Potential  Good    PT Frequency  1x / week    PT Duration  8 weeks    PT Treatment/Interventions  Canalith Repostioning;Aquatic Therapy;Cryotherapy;Moist Heat;Ultrasound;Gait training;Stair training;Functional mobility training;Therapeutic activities;Therapeutic exercise;Balance training;Patient/family education;Neuromuscular re-education;Manual techniques;Spinal Manipulations;Joint Manipulations;Vestibular;Orthotic Fit/Training;Dry needling;Energy conservation;Electrical Stimulation;Iontophoresis 4mg /ml Dexamethasone;Splinting;Taping    PT Next Visit Plan  ABC Scale; BLE strengthening and balance    PT Home Exercise Plan  not provided at this time    Consulted and Agree with Plan of Care  Patient       Patient will benefit from skilled therapeutic intervention in order to improve the following deficits and impairments:  Abnormal gait, Improper body mechanics, Impaired sensation, Postural dysfunction, Decreased range of motion, Decreased strength, Decreased activity tolerance, Decreased endurance, Decreased balance, Difficulty walking, Pain  Visit Diagnosis: Difficulty in walking, not elsewhere classified  Muscle weakness (generalized)  History of falling     Problem List Patient Active Problem List   Diagnosis Date Noted  . Acute delirium 02/21/2017  . Alzheimer's dementia ([place]) 02/21/2017  . Acute encephalopathy 02/18/2017  . Acute metabolic encephalopathy 16/60/6301  . UTI (urinary tract infection) 12/24/2016  . Hemorrhoid   . History of colon cancer 04/05/2015  . Chronic anemia 03/10/2015  . Osteopenia 12/11/2014  . CKD (chronic kidney disease) stage 3, GFR  30-59 ml/min (HCC) 06/18/2014  . SDAT (senile dementia of Alzheimer's type) ([place]) 06/18/2014  . Acquired hypothyroidism 03/01/2014  . Essential hypertension 03/01/2014  . Mixed hyperlipidemia 03/01/2014    Alanson Puls , PT DPT 01/08/2018, 3:27 PM  [place] MAIN [facility] SERVICES [address] [place], [place], [address] Phone: [phone]   Fax:  [phone]  Name: Haley Brooks MRN: [id number] Date of Birth: 1931-08-30

## 2018-01-15 ENCOUNTER — Ambulatory Visit: Payer: Medicare Other

## 2018-01-15 VITALS — BP 147/64 | HR 60

## 2018-01-15 DIAGNOSIS — M6281 Muscle weakness (generalized): Secondary | ICD-10-CM

## 2018-01-15 DIAGNOSIS — R262 Difficulty in walking, not elsewhere classified: Secondary | ICD-10-CM | POA: Diagnosis not present

## 2018-01-15 NOTE — Therapy (Signed)
[place] MAIN [facility] SERVICES [address] [place], [place], [address] Phone: [phone]   Fax:  [phone]  Physical Therapy Treatment  Patient Details  Name: Haley Brooks MRN: [id number] Date of Birth: 09-25-1931 Referring Provider (PT): Glendon Axe, MD   Encounter Date: 01/15/2018  PT End of Session - 01/15/18 1124    Visit Number  8    Number of Visits  9    Date for PT Re-Evaluation  01/21/18    Authorization Type  8/10 (PN 12/26/2017)    PT Start Time  1120    PT Stop Time  1200    PT Time Calculation (min)  40 min    Equipment Utilized During Treatment  Gait belt    Activity Tolerance  Patient tolerated treatment well    Behavior During Therapy  [facility] for tasks assessed/performed       Past Medical History:  Diagnosis Date  . Cancer [facility])    colon cancer   . Hemorrhoid   . Hypertension   . Thyroid disease     Past Surgical History:  Procedure Laterality Date  . COLON SURGERY    . FRACTURE SURGERY      Vitals:   01/15/18 1124  BP: (!) 147/64  Pulse: 60  SpO2: 100%    Subjective Assessment - 01/15/18 1123    Subjective  Pt has no complaints at start of session, no new medications, falls or stumbles or pain. Reported good compliance with HEP. Denies pain    Pertinent History  History of hypertension, hyperlipidemia, coronary artery disease s/p MI in 2006, hypothyroidism, osteoporosis, bilateral pulmonary embolism, previously on chronic warfarin therapy, h/o stroke in January 2014. Patient is currently on Eliquis. History of colon cancer treated in her 33s. Patient walks with a SPC.     Limitations  Lifting;Standing;Walking;House hold activities    How long can you sit comfortably?  n/a    How long can you stand comfortably?   >5 min w/ UE support    How long can you walk comfortably?  ~ 5 min w/ [facility]    Patient Stated Goals  feel more balanced; walk better; not be afraid of falling    Currently in Pain?   No/denies          TREATMENT   Ther-ex  NuStep L2/3 x 5 minutes for warm-up during history; Quantum leg press 90# x 20, 105# x 20; Attempted Quantum heel raises however pt doesn't understand how to perform properly; STS without UE support x 5, requires extensive cues for anterior weight shifting and falls backwards frequently; Heel raises x 15reps bilaterally, BUE support in // bars, supervision for safety Marching x 15 reps each leg, BUE support in // bars, supervision for safety Hip abduction x 15 each leg, BUE support in // bars, supervision for safety;   Neuromuscular Re-education  Tandem stance, eyes open, x 30 sec holds with each foot in rear x 2 bouts each foot, supervision for safety, no UE support Toe taps to 6" step without UE support alternating LE x 10 each; Side stepping in // bars without UE support x 4 lengths;   Pt educated throughout session about proper posture and technique with exercises. Improved exercise technique, movement at target joints, use of target muscles after min to mod verbal, visual, tactile cues.    Patient demonstrates good motivation for therapy but requires intermittent seated rest breaks due to fatigue. She requires redirection during session as well  as repetition and demonstration of commands in order to complete exercises correctly. She demonstrates difficulty with balance in tandem and single leg stance. She will need outcome measures and recertification at next visit. Pt encouraged to continue HEP and follow-up as scheduled.                     PT Short Term Goals - 11/26/17 1812      PT SHORT TERM GOAL #1   Title  Patient will be independent with HEP in order to maintain gains made in therapy and decrease risk of falls.    Baseline  not initiated    Time  2    Period  Weeks    Status  New    Target Date  12/10/17        PT Long Term Goals - 11/26/17 1813      PT LONG TERM GOAL #1   Title  Pt will improve DGI  by at least 6 points in order to demonstrate clinically significant improvement in balance and decreased risk for falls.    Baseline  11/26/17: 12/24    Time  8    Period  Weeks    Status  New    Target Date  01/21/18      PT LONG TERM GOAL #2   Title  Pt will improve BERG by at least 6 points in order to demonstrate clinically significant improvement in balance and decrease risk of falls.    Baseline  11/26/2017: 38/56    Time  8    Period  Weeks    Status  New    Target Date  01/21/18      PT LONG TERM GOAL #3   Title  Pt will improve ABC by at least 13% in order to demonstrate clinically significant improvement in balance confidence.     Baseline  11/26/2017: not taken    Time  8    Period  Weeks    Status  New    Target Date  01/21/18      PT LONG TERM GOAL #4   Title  Pt will decrease 5TSTS by at least 6 seconds in order to demonstrate clinically significant improvement in LE strength and decreased risk of falls.    Baseline  11/26/2017: 34 seconds (BUE support)    Time  8    Period  Weeks    Status  New    Target Date  01/21/18      PT LONG TERM GOAL #5   Title  Patient will improve gait speed as measured in 10MWT to >0.82 m/s comfortable speed and >1.0 m/s fast speed in order to be in the normative value ranges for her age group with the [place].     Baseline  11/26/2017: Self-selected: 24s = 0.42 m/s; Fastest: 19s = 0.53 m/s    Time  8    Period  Weeks    Status  New    Target Date  01/21/18      Additional Long Term Goals   Additional Long Term Goals  Yes      PT LONG TERM GOAL #6   Title  Patient will decrease TUG performed with LRAD to below 14 seconds in order to demonstrate decreased fall risk.    Baseline  11/26/2017: 20 seconds (SPC); 24 sec no AD    Time  8    Period  Weeks    Status  New  Target Date  01/21/18            Plan - 01/15/18 1128    Clinical Impression Statement  Patient demonstrates good motivation for therapy but requires  intermittent seated rest breaks due to fatigue. She requires redirection during session as well as repetition and demonstration of commands in order to complete exercises correctly. She demonstrates difficulty with balance in tandem and single leg stance. She will need outcome measures and recertification at next visit. Pt encouraged to continue HEP and follow-up as scheduled.     Rehab Potential  Good    PT Frequency  1x / week    PT Duration  8 weeks    PT Treatment/Interventions  Canalith Repostioning;Aquatic Therapy;Cryotherapy;Moist Heat;Ultrasound;Gait training;Stair training;Functional mobility training;Therapeutic activities;Therapeutic exercise;Balance training;Patient/family education;Neuromuscular re-education;Manual techniques;Spinal Manipulations;Joint Manipulations;Vestibular;Orthotic Fit/Training;Dry needling;Energy conservation;Electrical Stimulation;Iontophoresis 4mg /ml Dexamethasone;Splinting;Taping    PT Next Visit Plan  Outcome measures and recertification, BLE strengthening and balance    PT Home Exercise Plan  not provided at this time    Consulted and Agree with Plan of Care  Patient       Patient will benefit from skilled therapeutic intervention in order to improve the following deficits and impairments:  Abnormal gait, Improper body mechanics, Impaired sensation, Postural dysfunction, Decreased range of motion, Decreased strength, Decreased activity tolerance, Decreased endurance, Decreased balance, Difficulty walking, Pain  Visit Diagnosis: Difficulty in walking, not elsewhere classified  Muscle weakness (generalized)     Problem List Patient Active Problem List   Diagnosis Date Noted  . Acute delirium 02/21/2017  . Alzheimer's dementia ([place]) 02/21/2017  . Acute encephalopathy 02/18/2017  . Acute metabolic encephalopathy 62/95/2841  . UTI (urinary tract infection) 12/24/2016  . Hemorrhoid   . History of colon cancer 04/05/2015  . Chronic anemia 03/10/2015  .  Osteopenia 12/11/2014  . CKD (chronic kidney disease) stage 3, GFR 30-59 ml/min (HCC) 06/18/2014  . SDAT (senile dementia of Alzheimer's type) ([place]) 06/18/2014  . Acquired hypothyroidism 03/01/2014  . Essential hypertension 03/01/2014  . Mixed hyperlipidemia 03/01/2014   Phillips Grout PT, DPT, GCS  Huprich,Jason 01/16/2018, 12:44 PM  [place] MAIN [facility] SERVICES [address] [place], [place], [address] Phone: [phone]   Fax:  [phone]  Name: RAYANNA MATUSIK MRN: [id number] Date of Birth: [birth year]

## 2018-01-21 ENCOUNTER — Ambulatory Visit: Payer: Medicare Other | Admitting: Obstetrics and Gynecology

## 2018-01-22 ENCOUNTER — Ambulatory Visit: Payer: Medicare Other

## 2018-01-22 DIAGNOSIS — R262 Difficulty in walking, not elsewhere classified: Secondary | ICD-10-CM | POA: Diagnosis not present

## 2018-01-22 DIAGNOSIS — M6281 Muscle weakness (generalized): Secondary | ICD-10-CM

## 2018-01-22 DIAGNOSIS — Z9181 History of falling: Secondary | ICD-10-CM

## 2018-01-22 NOTE — Therapy (Signed)
[place] MAIN [facility] SERVICES [address] [place], [place], [address] Phone: [phone]   Fax:  [phone]  Physical Therapy Treatment/ Discharge/One time recert   Patient Details  Name: Haley Brooks MRN: [id number] Date of Birth: 1931/10/21 Referring Provider (PT): Glendon Axe, MD   Encounter Date: 01/22/2018  PT End of Session - 01/22/18 1802    Visit Number  9    Number of Visits  9    Date for PT Re-Evaluation  01/22/18    Authorization Type  9/10 (PN 12/26/2017)    PT Start Time  1430    PT Stop Time  1514    PT Time Calculation (min)  44 min    Equipment Utilized During Treatment  Gait belt    Activity Tolerance  Patient tolerated treatment well    Behavior During Therapy  [facility] for tasks assessed/performed       Past Medical History:  Diagnosis Date  . Cancer [facility])    colon cancer   . Hemorrhoid   . Hypertension   . Thyroid disease     Past Surgical History:  Procedure Laterality Date  . COLON SURGERY    . FRACTURE SURGERY      There were no vitals filed for this visit.  Subjective Assessment - 01/22/18 1450    Subjective  Patient reports she is feeling much more steady now compared to how she was before PT.     Pertinent History  History of hypertension, hyperlipidemia, coronary artery disease s/p MI in 2006, hypothyroidism, osteoporosis, bilateral pulmonary embolism, previously on chronic warfarin therapy, h/o stroke in January 2014. Patient is currently on Eliquis. History of colon cancer treated in her 81s. Patient walks with a SPC.     Limitations  Lifting;Standing;Walking;House hold activities    How long can you sit comfortably?  n/a    How long can you stand comfortably?   >5 min w/ UE support    How long can you walk comfortably?  ~ 5 min w/ [facility]    Patient Stated Goals  feel more balanced; walk better; not be afraid of falling    Currently in Pain?  No/denies       DGI: 15/24  BERG: 40/56  ABC  97% 5x STS: 15 seconds with BUE support  10 MWT: 12 seconds=.83 m/s TUG: 17 seconds with walking stick  [facility] PT Assessment - 01/22/18 0001      Standardized Balance Assessment   Standardized Balance Assessment  Berg Balance Test;Dynamic Gait Index      Berg Balance Test   Sit to Stand  Able to stand  independently using hands    Standing Unsupported  Able to stand safely 2 minutes    Sitting with Back Unsupported but Feet Supported on Floor or Stool  Able to sit safely and securely 2 minutes    Stand to Sit  Controls descent by using hands    Transfers  Able to transfer safely, definite need of hands    Standing Unsupported with Eyes Closed  Able to stand 10 seconds with supervision    Standing Ubsupported with Feet Together  Able to place feet together independently and stand for 1 minute with supervision    From Standing, Reach Forward with Outstretched Arm  Can reach forward >12 cm safely (5")    From Standing Position, Pick up Object from Floor  Able to pick up shoe safely and easily    From Standing Position, Turn  to Look Behind Over each Shoulder  Looks behind from both sides and weight shifts well    Turn 360 Degrees  Able to turn 360 degrees safely but slowly    Standing Unsupported, Alternately Place Feet on Step/Stool  Able to complete >2 steps/needs minimal assist    Standing Unsupported, One Foot in Front  Able to take small step independently and hold 30 seconds    Standing on One Leg  Tries to lift leg/unable to hold 3 seconds but remains standing independently    Total Score  40      Dynamic Gait Index   Level Surface  Mild Impairment    Change in Gait Speed  Mild Impairment    Gait with Horizontal Head Turns  Mild Impairment    Gait with Vertical Head Turns  Mild Impairment    Gait and Pivot Turn  Mild Impairment    Step Over Obstacle  Moderate Impairment    Step Around Obstacles  Mild Impairment    Steps  Mild Impairment    Total Score  15       Access Code:  [email or web address]  URL: [email or web address]  Date: 01/22/2018  Prepared by: Janna Arch   Exercises  Standing March with Unilateral Counter Support - 10 reps - 2 sets - 5 hold - 1x daily - 7x weekly  Standing Hip Abduction with Counter Support - 10 reps - 2 sets - 5 hold - 1x daily - 7x weekly  Standing Hip Extension with Counter Support - 10 reps - 2 sets - 5 hold - 1x daily - 7x weekly  Seated Heel Toe Raises - 10 reps - 2 sets - 5 hold - 1x daily - 7x weekly  Seated Long Arc Quad - 10 reps - 2 sets - 5 hold - 1x daily - 7x weekly  Standing Tandem Balance with Counter Support - 2 reps - 1 sets - 30 hold - 1x daily - 7x weekly  Sit to Stand with Armchair - 10 reps - 2 sets - 5 hold - 1x daily - 7x weekly                       PT Education - 01/22/18 1421    Education Details  goals, d/c HEP for home    Person(s) Educated  Patient    Methods  Explanation;Demonstration;Tactile cues;Verbal cues    Comprehension  Verbalized understanding;Returned demonstration;Verbal cues required;Tactile cues required       PT Short Term Goals - 01/22/18 1450      PT SHORT TERM GOAL #1   Title  Patient will be independent with HEP in order to maintain gains made in therapy and decrease risk of falls.    Baseline  HEP compliant    Time  2    Period  Weeks    Status  Achieved        PT Long Term Goals - 01/22/18 1432      PT LONG TERM GOAL #1   Title  Pt will improve DGI by at least 6 points in order to demonstrate clinically significant improvement in balance and decreased risk for falls.    Baseline  11/26/17: 12/24 1/22: 15/24     Time  8    Period  Weeks    Status  Partially Met      PT LONG TERM GOAL #2   Title  Pt will improve BERG by at least 6 points in order to  demonstrate clinically significant improvement in balance and decrease risk of falls.    Baseline  11/26/2017: 38/56 1/22: 40/56     Time  8    Period  Weeks    Status  Partially Met      PT  LONG TERM GOAL #3   Title  Pt will improve ABC by at least 13% in order to demonstrate clinically significant improvement in balance confidence.     Baseline  97%    Time  8    Period  Weeks    Status  Achieved      PT LONG TERM GOAL #4   Title  Pt will decrease 5TSTS by at least 6 seconds in order to demonstrate clinically significant improvement in LE strength and decreased risk of falls.    Baseline  11/26/2017: 34 seconds (BUE support) 1/22: 12 seconds (BUE support)     Time  8    Period  Weeks    Status  Achieved      PT LONG TERM GOAL #5   Title  Patient will improve gait speed as measured in 10MWT to >0.82 m/s comfortable speed and >1.0 m/s fast speed in order to be in the normative value ranges for her age group with the [place].     Baseline  11/26/2017: Self-selected: 24s = 0.42 m/s; Fastest: 19s = 0.53 m/s 1/22: 0.83 m/s     Time  8    Period  Weeks    Status  Partially Met      PT LONG TERM GOAL #6   Title  Patient will decrease TUG performed with LRAD to below 14 seconds in order to demonstrate decreased fall risk.    Baseline  11/26/2017: 20 seconds (SPC); 24 sec no AD 1/32: 17 seconds with walking stick     Time  8    Period  Weeks    Status  Partially Met            Plan - 01/22/18 1804    Clinical Impression Statement  Patient has demonstrated progression towards all goals at this time. Patient reports she feels more confident in her mobility and feels like she is happy where she is at and is ready to be done with therapy. [facility]. Due to patient being ready to be done with therapy today will be patient's last day. I will be happy to see patient again in the future as needed.     Rehab Potential  Good    PT Frequency  One time visit    PT Treatment/Interventions  Canalith Repostioning;Aquatic Therapy;Cryotherapy;Moist Heat;Ultrasound;Gait training;Stair training;Functional mobility training;Therapeutic activities;Therapeutic  exercise;Balance training;Patient/family education;Neuromuscular re-education;Manual techniques;Spinal Manipulations;Joint Manipulations;Vestibular;Orthotic Fit/Training;Dry needling;Energy conservation;Electrical Stimulation;Iontophoresis 39m/ml Dexamethasone;Splinting;Taping    PT Home Exercise Plan  see in sheet    Consulted and Agree with Plan of Care  Patient       Patient will benefit from skilled therapeutic intervention in order to improve the following deficits and impairments:  Abnormal gait, Improper body mechanics, Impaired sensation, Postural dysfunction, Decreased range of motion, Decreased strength, Decreased activity tolerance, Decreased endurance, Decreased balance, Difficulty walking, Pain  Visit Diagnosis: Difficulty in walking, not elsewhere classified  Muscle weakness (generalized)  History of falling     Problem List Patient Active Problem List   Diagnosis Date Noted  . Acute delirium 02/21/2017  . Alzheimer's dementia ([place] 02/21/2017  . Acute encephalopathy 02/18/2017  . Acute metabolic encephalopathy 145/85/9292 . UTI (  urinary tract infection) 12/24/2016  . Hemorrhoid   . History of colon cancer 04/05/2015  . Chronic anemia 03/10/2015  . Osteopenia 12/11/2014  . CKD (chronic kidney disease) stage 3, GFR 30-59 ml/min (HCC) 06/18/2014  . SDAT (senile dementia of Alzheimer's type) ([place]) 06/18/2014  . Acquired hypothyroidism 03/01/2014  . Essential hypertension 03/01/2014  . Mixed hyperlipidemia 03/01/2014   Janna Arch, PT, DPT   01/22/2018, 6:06 PM  [place] MAIN [facility] SERVICES [address] [place], [place], [address] Phone: [phone]   Fax:  [phone]  Name: Haley Brooks MRN: [id number] Date of Birth: 1931-06-26

## 2018-01-27 ENCOUNTER — Ambulatory Visit: Payer: Medicare Other | Admitting: Obstetrics and Gynecology

## 2018-01-27 ENCOUNTER — Encounter: Payer: Self-pay | Admitting: Obstetrics and Gynecology

## 2018-01-27 VITALS — BP 140/74 | HR 64 | Wt 138.0 lb

## 2018-01-27 DIAGNOSIS — Z4689 Encounter for fitting and adjustment of other specified devices: Secondary | ICD-10-CM | POA: Diagnosis not present

## 2018-01-27 NOTE — Progress Notes (Signed)
Obstetrics & Gynecology Office Visit   Chief Complaint:  Chief Complaint  Patient presents with  . Pessary Check    History of Present Illness: Haley Brooks a 85 y.o.femalewho is seen today for a follow-up appointment for a pessary check. She is using asize 5 ring with supportpessary for vaginal vault prolapse  SheDeniesvaginal bleeding. She Deniesvaginal discharge. She is voiding and defecating withoutdifficulty.Pessary has been comfortable, no concerns about seating of pessary per patient.   Review of Systems: Review of Systems  Constitutional: Negative.   Genitourinary: Negative.   Skin: Negative.    Past Medical History:  Past Medical History:  Diagnosis Date  . Cancer [facility])    colon cancer   . Hemorrhoid   . Hypertension   . Thyroid disease     Past Surgical History:  Past Surgical History:  Procedure Laterality Date  . COLON SURGERY    . FRACTURE SURGERY      Gynecologic History: No LMP recorded. Patient is postmenopausal.  Obstetric History: G3P3  Family History:  Family History  Problem Relation Age of Onset  . Colon cancer Mother   . Diabetes Father   . Cancer Brother     Social History:  Social History   Socioeconomic History  . Marital status: Married    Spouse name: Not on file  . Number of children: Not on file  . Years of education: Not on file  . Highest education level: Not on file  Occupational History  . Not on file  Social Needs  . Financial resource strain: Not on file  . Food insecurity:    Worry: Not on file    Inability: Not on file  . Transportation needs:    Medical: Not on file    Non-medical: Not on file  Tobacco Use  . Smoking status: Never Smoker  . Smokeless tobacco: Never Used  Substance and Sexual Activity  . Alcohol use: No  . Drug use: No  . Sexual activity: Never  Lifestyle  . Physical activity:    Days per week: Not on file    Minutes per session: Not on file  . Stress: Not  on file  Relationships  . Social connections:    Talks on phone: Not on file    Gets together: Not on file    Attends religious service: Not on file    Active member of club or organization: Not on file    Attends meetings of clubs or organizations: Not on file    Relationship status: Not on file  . Intimate partner violence:    Fear of current or ex partner: Not on file    Emotionally abused: Not on file    Physically abused: Not on file    Forced sexual activity: Not on file  Other Topics Concern  . Not on file  Social History Narrative  . Not on file    Allergies:  No Known Allergies  Medications: Prior to Admission medications   Medication Sig Start Date End Date Taking? Authorizing Provider  alendronate (FOSAMAX) 70 MG tablet Take 1 tablet by mouth 1 day or 1 dose. 03/25/15  Yes [provider]  amLODipine (NORVASC) 10 MG tablet Take 10 mg by mouth daily.    Yes [provider]  apixaban (ELIQUIS) 5 MG TABS tablet Take 5 mg by mouth 2 (two) times daily.   Yes [provider]  aspirin 325 MG EC tablet Take 325 mg by mouth daily.  Yes [provider]  atorvastatin (LIPITOR) 20 MG tablet Take 20 mg by mouth daily.   Yes [provider]  benazepril (LOTENSIN) 40 MG tablet Take 1 tablet (40 mg total) by mouth daily. 02/22/17  Yes Vaughan Basta, MD  donepezil (ARICEPT) 10 MG tablet Take 1 tablet by mouth 2 (two) times daily. 12/21/16  Yes [provider]  ferrous sulfate 325 (65 FE) MG tablet Take 325 mg by mouth 2 (two) times daily with a meal.   Yes [provider]  levothyroxine (SYNTHROID, LEVOTHROID) 200 MCG tablet Take 1 tablet (200 mcg total) by mouth daily before breakfast. 02/22/17  Yes Vaughan Basta, MD  metoprolol succinate (TOPROL-XL) 25 MG 24 hr tablet Take 25 mg by mouth daily.   Yes [provider]  PREMARIN vaginal cream INSERT 1 APPLICATORFUL  VAGINALLY TWICE WEEKLY 11/19/17   Yes [name], MD    Physical Exam Vitals:  Vitals:   01/27/18 1110  BP: 140/74  Pulse: 64   No LMP recorded. Patient is postmenopausal.  General: NAD HEENT: normocephalic, anicteric Pulmonary: No increased work of breathing Genitourinary:  External: Normal external female genitalia.  Normal urethral meatus, normal Bartholin's and Skene's glands.    Vagina: Normal vaginal mucosa, no evidence of erosions, anterior cystocele   Cervix: Grossly normal in appearance, no bleeding  Uterus: Non-enlarged, mobile, normal contour.  No CMT  Adnexa: ovaries non-enlarged, no adnexal masses  Rectal: deferred  Lymphatic: no evidence of inguinal lymphadenopathy Neurologic: Grossly intact Psychiatric: mood appropriate, affect full  Female chaperone present for pelvic  portions of the physical exam  Assessment: 85 y.o. G3P3 pessary follow up  Plan: Problem List Items Addressed This Visit    None    Visit Diagnoses    Pessary maintenance    -  Primary     - Pessary cleaned and replaced.  Good position.  No erosions.  Good symptoms control - A total of 15 minutes were spent in face-to-face contact with the patient during this encounter with over half of that time devoted to counseling and coordination of care. - Return in about 3 months (around 04/28/2018) for Pessary follow up.    [name], MD, [facility], [facility] 01/27/2018, 5:55 PM

## 2018-01-29 ENCOUNTER — Ambulatory Visit: Payer: Medicare Other

## 2018-04-28 ENCOUNTER — Ambulatory Visit (INDEPENDENT_AMBULATORY_CARE_PROVIDER_SITE_OTHER): Payer: Medicare Other | Admitting: Obstetrics and Gynecology

## 2018-04-28 ENCOUNTER — Other Ambulatory Visit: Payer: Self-pay

## 2018-04-28 DIAGNOSIS — N819 Female genital prolapse, unspecified: Secondary | ICD-10-CM | POA: Diagnosis not present

## 2018-04-28 NOTE — Progress Notes (Signed)
Spoke with patient less than 5 minutes.  Disucssed that we do recommend social distancing for her age group, discussed steps we have done to make office check in such that we do not have a lot of patient's in the waiting room, an dthat we probably can delay pessary check by about a month but I do want to verify that it is sitting properly and not causing any erosions.  No pain no bleeding, prolapse remains reduced adequately.

## 2019-03-20 ENCOUNTER — Other Ambulatory Visit: Payer: Self-pay

## 2019-03-20 ENCOUNTER — Encounter: Payer: Self-pay | Admitting: Emergency Medicine

## 2019-03-20 DIAGNOSIS — N184 Chronic kidney disease, stage 4 (severe): Secondary | ICD-10-CM | POA: Diagnosis present

## 2019-03-20 DIAGNOSIS — Z7982 Long term (current) use of aspirin: Secondary | ICD-10-CM

## 2019-03-20 DIAGNOSIS — Z79899 Other long term (current) drug therapy: Secondary | ICD-10-CM

## 2019-03-20 DIAGNOSIS — G309 Alzheimer's disease, unspecified: Secondary | ICD-10-CM | POA: Diagnosis present

## 2019-03-20 DIAGNOSIS — Y92009 Unspecified place in unspecified non-institutional (private) residence as the place of occurrence of the external cause: Secondary | ICD-10-CM

## 2019-03-20 DIAGNOSIS — R29700 NIHSS score 0: Secondary | ICD-10-CM | POA: Diagnosis not present

## 2019-03-20 DIAGNOSIS — R4182 Altered mental status, unspecified: Secondary | ICD-10-CM | POA: Diagnosis not present

## 2019-03-20 DIAGNOSIS — I083 Combined rheumatic disorders of mitral, aortic and tricuspid valves: Secondary | ICD-10-CM | POA: Diagnosis present

## 2019-03-20 DIAGNOSIS — E039 Hypothyroidism, unspecified: Secondary | ICD-10-CM | POA: Diagnosis present

## 2019-03-20 DIAGNOSIS — I4892 Unspecified atrial flutter: Secondary | ICD-10-CM | POA: Diagnosis present

## 2019-03-20 DIAGNOSIS — Z85038 Personal history of other malignant neoplasm of large intestine: Secondary | ICD-10-CM

## 2019-03-20 DIAGNOSIS — I480 Paroxysmal atrial fibrillation: Secondary | ICD-10-CM | POA: Diagnosis present

## 2019-03-20 DIAGNOSIS — F0280 Dementia in other diseases classified elsewhere without behavioral disturbance: Secondary | ICD-10-CM | POA: Diagnosis present

## 2019-03-20 DIAGNOSIS — R29701 NIHSS score 1: Secondary | ICD-10-CM | POA: Diagnosis present

## 2019-03-20 DIAGNOSIS — Z7901 Long term (current) use of anticoagulants: Secondary | ICD-10-CM

## 2019-03-20 DIAGNOSIS — N3000 Acute cystitis without hematuria: Secondary | ICD-10-CM | POA: Diagnosis present

## 2019-03-20 DIAGNOSIS — Z800 Family history of malignant neoplasm of digestive organs: Secondary | ICD-10-CM

## 2019-03-20 DIAGNOSIS — Z79890 Hormone replacement therapy (postmenopausal): Secondary | ICD-10-CM

## 2019-03-20 DIAGNOSIS — Z87442 Personal history of urinary calculi: Secondary | ICD-10-CM

## 2019-03-20 DIAGNOSIS — W19XXXA Unspecified fall, initial encounter: Secondary | ICD-10-CM | POA: Diagnosis present

## 2019-03-20 DIAGNOSIS — R29702 NIHSS score 2: Secondary | ICD-10-CM | POA: Diagnosis not present

## 2019-03-20 DIAGNOSIS — E785 Hyperlipidemia, unspecified: Secondary | ICD-10-CM | POA: Diagnosis present

## 2019-03-20 DIAGNOSIS — Z20822 Contact with and (suspected) exposure to covid-19: Secondary | ICD-10-CM | POA: Diagnosis present

## 2019-03-20 DIAGNOSIS — I129 Hypertensive chronic kidney disease with stage 1 through stage 4 chronic kidney disease, or unspecified chronic kidney disease: Secondary | ICD-10-CM | POA: Diagnosis present

## 2019-03-20 DIAGNOSIS — I6340 Cerebral infarction due to embolism of unspecified cerebral artery: Principal | ICD-10-CM | POA: Diagnosis present

## 2019-03-20 DIAGNOSIS — D631 Anemia in chronic kidney disease: Secondary | ICD-10-CM | POA: Diagnosis present

## 2019-03-20 DIAGNOSIS — Z7983 Long term (current) use of bisphosphonates: Secondary | ICD-10-CM

## 2019-03-20 DIAGNOSIS — N179 Acute kidney failure, unspecified: Secondary | ICD-10-CM | POA: Diagnosis present

## 2019-03-20 LAB — COMPREHENSIVE METABOLIC PANEL
ALT: 17 U/L (ref 0–44)
AST: 32 U/L (ref 15–41)
Albumin: 3.4 g/dL — ABNORMAL LOW (ref 3.5–5.0)
Alkaline Phosphatase: 71 U/L (ref 38–126)
Anion gap: 8 (ref 5–15)
BUN: 27 mg/dL — ABNORMAL HIGH (ref 8–23)
CO2: 30 mmol/L (ref 22–32)
Calcium: 9.4 mg/dL (ref 8.9–10.3)
Chloride: 101 mmol/L (ref 98–111)
Creatinine, Ser: 1.51 mg/dL — ABNORMAL HIGH (ref 0.44–1.00)
GFR calc Af Amer: 36 mL/min — ABNORMAL LOW (ref >60)
GFR calc non Af Amer: 31 mL/min — ABNORMAL LOW (ref >60)
Glucose, Bld: 110 mg/dL — ABNORMAL HIGH (ref 70–99)
Potassium: 3.7 mmol/L (ref 3.5–5.1)
Sodium: 139 mmol/L (ref 135–145)
Total Bilirubin: 0.6 mg/dL (ref 0.3–1.2)
Total Protein: 7 g/dL (ref 6.5–8.1)

## 2019-03-20 LAB — CBC
HCT: 37.7 % (ref 36.0–46.0)
Hemoglobin: 12.1 g/dL (ref 12.0–15.0)
MCH: 29.7 pg (ref 26.0–34.0)
MCHC: 32.1 g/dL (ref 30.0–36.0)
MCV: 92.4 fL (ref 80.0–100.0)
Platelets: 182 10*3/uL (ref 150–400)
RBC: 4.08 MIL/uL (ref 3.87–5.11)
RDW: 14.9 % (ref 11.5–15.5)
WBC: 8.9 10*3/uL (ref 4.0–10.5)
nRBC: 0 % (ref 0.0–0.2)

## 2019-03-20 NOTE — ED Triage Notes (Signed)
Patient's daughter states that the patient has been confused for 3-4 days. Patient's daughter states that she has had similar confusion in the past with UTIs.

## 2019-03-21 ENCOUNTER — Inpatient Hospital Stay
Admission: EM | Admit: 2019-03-21 | Discharge: 2019-03-24 | DRG: 065 | Disposition: A | Discharge status: Skilled Nursing Facility | Payer: Medicare Other | Attending: Internal Medicine | Admitting: Internal Medicine

## 2019-03-21 ENCOUNTER — Emergency Department: Payer: Medicare Other

## 2019-03-21 ENCOUNTER — Inpatient Hospital Stay: Payer: Medicare Other

## 2019-03-21 DIAGNOSIS — W19XXXA Unspecified fall, initial encounter: Secondary | ICD-10-CM | POA: Diagnosis present

## 2019-03-21 DIAGNOSIS — Z79890 Hormone replacement therapy (postmenopausal): Secondary | ICD-10-CM | POA: Diagnosis not present

## 2019-03-21 DIAGNOSIS — Z7983 Long term (current) use of bisphosphonates: Secondary | ICD-10-CM | POA: Diagnosis not present

## 2019-03-21 DIAGNOSIS — N183 Chronic kidney disease, stage 3 unspecified: Secondary | ICD-10-CM | POA: Diagnosis present

## 2019-03-21 DIAGNOSIS — N184 Chronic kidney disease, stage 4 (severe): Secondary | ICD-10-CM

## 2019-03-21 DIAGNOSIS — E039 Hypothyroidism, unspecified: Secondary | ICD-10-CM | POA: Diagnosis present

## 2019-03-21 DIAGNOSIS — I639 Cerebral infarction, unspecified: Secondary | ICD-10-CM | POA: Diagnosis present

## 2019-03-21 DIAGNOSIS — D631 Anemia in chronic kidney disease: Secondary | ICD-10-CM | POA: Diagnosis present

## 2019-03-21 DIAGNOSIS — R29702 NIHSS score 2: Secondary | ICD-10-CM | POA: Diagnosis not present

## 2019-03-21 DIAGNOSIS — I779 Disorder of arteries and arterioles, unspecified: Secondary | ICD-10-CM

## 2019-03-21 DIAGNOSIS — I10 Essential (primary) hypertension: Secondary | ICD-10-CM | POA: Diagnosis present

## 2019-03-21 DIAGNOSIS — N3000 Acute cystitis without hematuria: Secondary | ICD-10-CM | POA: Diagnosis present

## 2019-03-21 DIAGNOSIS — R29700 NIHSS score 0: Secondary | ICD-10-CM | POA: Diagnosis not present

## 2019-03-21 DIAGNOSIS — R4182 Altered mental status, unspecified: Secondary | ICD-10-CM

## 2019-03-21 DIAGNOSIS — Z7982 Long term (current) use of aspirin: Secondary | ICD-10-CM | POA: Diagnosis not present

## 2019-03-21 DIAGNOSIS — I6340 Cerebral infarction due to embolism of unspecified cerebral artery: Secondary | ICD-10-CM | POA: Diagnosis present

## 2019-03-21 DIAGNOSIS — E785 Hyperlipidemia, unspecified: Secondary | ICD-10-CM | POA: Diagnosis present

## 2019-03-21 DIAGNOSIS — Z85038 Personal history of other malignant neoplasm of large intestine: Secondary | ICD-10-CM | POA: Diagnosis not present

## 2019-03-21 DIAGNOSIS — I083 Combined rheumatic disorders of mitral, aortic and tricuspid valves: Secondary | ICD-10-CM | POA: Diagnosis present

## 2019-03-21 DIAGNOSIS — G309 Alzheimer's disease, unspecified: Secondary | ICD-10-CM

## 2019-03-21 DIAGNOSIS — I4892 Unspecified atrial flutter: Secondary | ICD-10-CM | POA: Diagnosis present

## 2019-03-21 DIAGNOSIS — I480 Paroxysmal atrial fibrillation: Secondary | ICD-10-CM

## 2019-03-21 DIAGNOSIS — I129 Hypertensive chronic kidney disease with stage 1 through stage 4 chronic kidney disease, or unspecified chronic kidney disease: Secondary | ICD-10-CM | POA: Diagnosis present

## 2019-03-21 DIAGNOSIS — Z20822 Contact with and (suspected) exposure to covid-19: Secondary | ICD-10-CM | POA: Diagnosis present

## 2019-03-21 DIAGNOSIS — Z7901 Long term (current) use of anticoagulants: Secondary | ICD-10-CM | POA: Diagnosis not present

## 2019-03-21 DIAGNOSIS — N179 Acute kidney failure, unspecified: Secondary | ICD-10-CM | POA: Diagnosis present

## 2019-03-21 DIAGNOSIS — R29701 NIHSS score 1: Secondary | ICD-10-CM | POA: Diagnosis present

## 2019-03-21 DIAGNOSIS — N390 Urinary tract infection, site not specified: Secondary | ICD-10-CM | POA: Diagnosis present

## 2019-03-21 DIAGNOSIS — Z800 Family history of malignant neoplasm of digestive organs: Secondary | ICD-10-CM | POA: Diagnosis not present

## 2019-03-21 DIAGNOSIS — F0280 Dementia in other diseases classified elsewhere without behavioral disturbance: Secondary | ICD-10-CM

## 2019-03-21 DIAGNOSIS — Y92009 Unspecified place in unspecified non-institutional (private) residence as the place of occurrence of the external cause: Secondary | ICD-10-CM | POA: Diagnosis not present

## 2019-03-21 DIAGNOSIS — Z87442 Personal history of urinary calculi: Secondary | ICD-10-CM | POA: Diagnosis not present

## 2019-03-21 LAB — CBC
HCT: 34.6 % — ABNORMAL LOW (ref 36.0–46.0)
Hemoglobin: 11 g/dL — ABNORMAL LOW (ref 12.0–15.0)
MCH: 29.6 pg (ref 26.0–34.0)
MCHC: 31.8 g/dL (ref 30.0–36.0)
MCV: 93.3 fL (ref 80.0–100.0)
Platelets: 163 10*3/uL (ref 150–400)
RBC: 3.71 MIL/uL — ABNORMAL LOW (ref 3.87–5.11)
RDW: 14.9 % (ref 11.5–15.5)
WBC: 8.8 10*3/uL (ref 4.0–10.5)
nRBC: 0 % (ref 0.0–0.2)

## 2019-03-21 LAB — URINALYSIS, COMPLETE (UACMP) WITH MICROSCOPIC
Bilirubin Urine: NEGATIVE
Glucose, UA: 50 mg/dL — AB
Ketones, ur: NEGATIVE mg/dL
Nitrite: NEGATIVE
Protein, ur: >=300 mg/dL — AB
Specific Gravity, Urine: 1.019 (ref 1.005–1.030)
WBC, UA: >50 WBC/hpf — ABNORMAL HIGH (ref 0–5)
pH: 6 (ref 5.0–8.0)

## 2019-03-21 LAB — HEPARIN LEVEL (UNFRACTIONATED): Heparin Unfractionated: 2 IU/mL — ABNORMAL HIGH (ref 0.30–0.70)

## 2019-03-21 LAB — TROPONIN I (HIGH SENSITIVITY): Troponin I (High Sensitivity): 6 ng/L (ref <18)

## 2019-03-21 LAB — PROTIME-INR
INR: 1.3 — ABNORMAL HIGH (ref 0.8–1.2)
Prothrombin Time: 16 seconds — ABNORMAL HIGH (ref 11.4–15.2)

## 2019-03-21 LAB — APTT: aPTT: 28 seconds (ref 24–36)

## 2019-03-21 LAB — SARS CORONAVIRUS 2 (TAT 6-24 HRS): SARS Coronavirus 2: NEGATIVE

## 2019-03-21 MED ORDER — ASPIRIN 300 MG RE SUPP: 300.0000 mg | Freq: Every day | RECTAL | Status: DC

## 2019-03-21 MED ORDER — SODIUM CHLORIDE 0.9 % IV SOLN
1.0000 g | Freq: Once | INTRAVENOUS | Status: AC
  Administered 2019-03-21: 1 g via INTRAVENOUS
  Filled 2019-03-21: qty 10

## 2019-03-21 MED ORDER — ESTROGENS, CONJUGATED 0.625 MG/GM VA CREA
1.0000 | TOPICAL_CREAM | VAGINAL | Status: DC
  Filled 2019-03-21: qty 30

## 2019-03-21 MED ORDER — STROKE: EARLY STAGES OF RECOVERY BOOK: Freq: Once | Status: AC

## 2019-03-21 MED ORDER — HEPARIN BOLUS VIA INFUSION
3150.0000 [IU] | Freq: Once | INTRAVENOUS | Status: AC
  Administered 2019-03-21: 3150 [IU] via INTRAVENOUS
  Filled 2019-03-21: qty 3150

## 2019-03-21 MED ORDER — ACETAMINOPHEN 325 MG PO TABS
650.0000 mg | ORAL_TABLET | ORAL | Status: DC | PRN
  Administered 2019-03-23: 650 mg via ORAL
  Filled 2019-03-21: qty 2

## 2019-03-21 MED ORDER — ATORVASTATIN CALCIUM 20 MG PO TABS
80.0000 mg | ORAL_TABLET | Freq: Every day | ORAL | Status: DC
  Administered 2019-03-21 – 2019-03-24 (×4): 80 mg via ORAL
  Filled 2019-03-21 (×4): qty 4

## 2019-03-21 MED ORDER — ACETAMINOPHEN 160 MG/5ML PO SOLN
650.0000 mg | ORAL | Status: DC | PRN
  Filled 2019-03-21: qty 20.3

## 2019-03-21 MED ORDER — ASPIRIN EC 325 MG PO TBEC
325.0000 mg | DELAYED_RELEASE_TABLET | Freq: Every day | ORAL | Status: DC
  Administered 2019-03-21 – 2019-03-24 (×4): 325 mg via ORAL
  Filled 2019-03-21 (×4): qty 1

## 2019-03-21 MED ORDER — DONEPEZIL HCL 5 MG PO TABS
10.0000 mg | ORAL_TABLET | Freq: Two times a day (BID) | ORAL | Status: DC
  Administered 2019-03-21 – 2019-03-24 (×7): 10 mg via ORAL
  Filled 2019-03-21 (×7): qty 2

## 2019-03-21 MED ORDER — HEPARIN (PORCINE) 25000 UT/250ML-% IV SOLN
700.0000 [IU]/h | INTRAVENOUS | Status: DC
  Administered 2019-03-21: 850 [IU]/h via INTRAVENOUS
  Filled 2019-03-21 (×2): qty 250

## 2019-03-21 MED ORDER — SODIUM CHLORIDE 0.9 % IV BOLUS
500.0000 mL | Freq: Once | INTRAVENOUS | Status: AC
  Administered 2019-03-21: 500 mL via INTRAVENOUS

## 2019-03-21 MED ORDER — LEVOTHYROXINE SODIUM 100 MCG PO TABS
200.0000 ug | ORAL_TABLET | Freq: Every day | ORAL | Status: DC
  Administered 2019-03-21 – 2019-03-24 (×4): 200 ug via ORAL
  Filled 2019-03-21 (×2): qty 2
  Filled 2019-03-21: qty 4
  Filled 2019-03-21: qty 2

## 2019-03-21 MED ORDER — SENNOSIDES-DOCUSATE SODIUM 8.6-50 MG PO TABS: 2.0000 | ORAL_TABLET | Freq: Every evening | ORAL | Status: DC | PRN

## 2019-03-21 MED ORDER — SODIUM CHLORIDE 0.9 % IV SOLN
1.0000 g | INTRAVENOUS | Status: DC
  Administered 2019-03-22 – 2019-03-24 (×3): 1 g via INTRAVENOUS
  Filled 2019-03-21: qty 10
  Filled 2019-03-21 (×3): qty 1

## 2019-03-21 MED ORDER — FERROUS SULFATE 325 (65 FE) MG PO TABS
325.0000 mg | ORAL_TABLET | Freq: Two times a day (BID) | ORAL | Status: DC
  Administered 2019-03-21 – 2019-03-24 (×8): 325 mg via ORAL
  Filled 2019-03-21 (×10): qty 1

## 2019-03-21 MED ORDER — DEXTROSE-NACL 5-0.9 % IV SOLN: INTRAVENOUS | Status: DC

## 2019-03-21 MED ORDER — ACETAMINOPHEN 650 MG RE SUPP: 650.0000 mg | RECTAL | Status: DC | PRN

## 2019-03-21 NOTE — ED Notes (Signed)
Patient assisted to the bathroom 

## 2019-03-21 NOTE — ED Notes (Addendum)
Attempted to call report. Spoke with Agricultural consultant and handoff given. Primary RN will call with questions. They are getting low bed for pt, and then will transfer

## 2019-03-21 NOTE — ED Notes (Signed)
Called dietary for meal tray, pt did not have tray arrive with rest of ED trays

## 2019-03-21 NOTE — H&P (Addendum)
History and Physical    Haley Brooks:[id number] DOB: [birth year] DOA: 03/21/2019  PCP: Kirk Ruths, MD   Patient coming from: Home  I have personally briefly reviewed patient's old medical records in [place]  Chief Complaint: Change in mental status  HPI: Haley Brooks is a 86 y.o. female with medical history significant for paroxysmal atrial fibrillation on anticoagulation, hypertension, kidney disease stage IV, thyroid disease and dementia who was brought into the emergency room for evaluation of change in mental status.  Her daughter who provided most of the history stated that she appeared more confused than normal.  She has had mental status changes in the past related to a UTI and so she wanted her to get checked to make sure she did not have a urinary tract infection.  Patient also had a fall at home with complaints of back pain.  Patient had a CT scan of the head which showed an area of hypoattenuation in the left parietal lobe is new compared to the prior study but otherwise age-indeterminate. MRI wasrecommended for better characterization as is may indicate an acute or subacute infarct.  MRI of the brain showed multifocal acute ischemia. Largest area is in the left parietal lobe. There are scattered punctate foci within both cerebralhemispheres and the right cerebellum involving multiple vascularterritories. No acute hemorrhage or mass effect. Numerous chronic microhemorrhages in a predominantly central distribution, consistent with chronic hypertensive angiopathy.  ED Course: Patient is an 86 year old who was seen in the emergency room for evaluation of confusion and concerns for possible urinary tract infection.  Family also mentioned the patient had a fall at home and since she is on Eliquis, CT scan of the head was done without contrast to rule out intracranial hemorrhage and a CT scan of cervical spine was done as well to rule out fracture.  CT head was  concerning for possible acute vs subacute infarct and MRI was recommended.  Patient noted to have pyuria.  CT scan of the abdomen  Review of Systems: As per HPI otherwise 10 point review of systems negative.    Past Medical History:  Diagnosis Date  . Cancer [facility])    colon cancer   . Hemorrhoid   . Hypertension   . Thyroid disease     Past Surgical History:  Procedure Laterality Date  . COLON SURGERY    . FRACTURE SURGERY       reports that she has never smoked. She has never used smokeless tobacco. She reports that she does not drink alcohol or use drugs.  No Known Allergies  Family History  Problem Relation Age of Onset  . Colon cancer Mother   . Diabetes Father   . Cancer Brother      Prior to Admission medications   Medication Sig Start Date End Date Taking? Authorizing Provider  alendronate (FOSAMAX) 70 MG tablet Take 1 tablet by mouth 1 day or 1 dose. 03/25/15   [provider]  amLODipine (NORVASC) 10 MG tablet Take 10 mg by mouth daily.     [provider]  apixaban (ELIQUIS) 5 MG TABS tablet Take 5 mg by mouth 2 (two) times daily.    [provider]  aspirin 325 MG EC tablet Take 325 mg by mouth daily.     [provider]  atorvastatin (LIPITOR) 20 MG tablet Take 20 mg by mouth daily.    [provider]  benazepril (LOTENSIN) 40 MG tablet Take 1 tablet (40  mg total) by mouth daily. 02/22/17   Vaughan Basta, MD  donepezil (ARICEPT) 10 MG tablet Take 1 tablet by mouth 2 (two) times daily. 12/21/16   [provider]  ferrous sulfate 325 (65 FE) MG tablet Take 325 mg by mouth 2 (two) times daily with a meal.    [provider]  levothyroxine (SYNTHROID, LEVOTHROID) 200 MCG tablet Take 1 tablet (200 mcg total) by mouth daily before breakfast. 02/22/17   Vaughan Basta, MD  metoprolol succinate (TOPROL-XL) 25 MG 24 hr tablet Take 25 mg by mouth daily.    [provider]  PREMARIN  vaginal cream INSERT 1 APPLICATORFUL  VAGINALLY TWICE WEEKLY 11/19/17   [name], MD    Physical Exam: Vitals:   03/21/19 0830 03/21/19 0900 03/21/19 0930 03/21/19 1130  BP: (!) 179/77  (!) 159/78 (!) 174/68  Pulse: (!) 51  (!) 52 (!) 52  Resp: 16 (!) 24 17 15   Temp:      TempSrc:      SpO2: 98%  95% 97%  Weight:      Height:         Vitals:   03/21/19 0830 03/21/19 0900 03/21/19 0930 03/21/19 1130  BP: (!) 179/77  (!) 159/78 (!) 174/68  Pulse: (!) 51  (!) 52 (!) 52  Resp: 16 (!) 24 17 15   Temp:      TempSrc:      SpO2: 98%  95% 97%  Weight:      Height:        Constitutional: NAD, alert and oriented to person, place and time Eyes: PERRL, lids and conjunctivae normal ENMT: Mucous membranes are moist.  Neck: normal, supple, no masses, no thyromegaly Respiratory: clear to auscultation bilaterally, no wheezing, no crackles. Normal respiratory effort. No accessory muscle use.  Cardiovascular: Regular rate and rhythm, no murmurs / rubs / gallops. No extremity edema. 2+ pedal pulses. No carotid bruits.  Abdomen: no tenderness, no masses palpated. No hepatosplenomegaly. Bowel sounds positive.  Musculoskeletal: no clubbing / cyanosis. No joint deformity upper and lower extremities.  Skin: no rashes, lesions, ulcers.  Neurologic: No gross focal neurologic deficit. Patient able to move all extremities Psychiatric: Normal mood and affect.   Labs on Admission: I have personally reviewed following labs and imaging studies  CBC: Recent Labs  Lab 03/20/19 1923  WBC 8.9  HGB 12.1  HCT 37.7  MCV 92.4  PLT 811   Basic Metabolic Panel: Recent Labs  Lab 03/20/19 1923  NA 139  K 3.7  CL 101  CO2 30  GLUCOSE 110*  BUN 27*  CREATININE 1.51*  CALCIUM 9.4   GFR: Estimated Creatinine Clearance: 23.5 mL/min (A) (by C-G formula based on SCr of 1.51 mg/dL (H)). Liver Function Tests: Recent Labs  Lab 03/20/19 1923  AST 32  ALT 17  ALKPHOS 71  BILITOT 0.6  PROT  7.0  ALBUMIN 3.4*   No results for input(s): LIPASE, AMYLASE in the last 168 hours. No results for input(s): AMMONIA in the last 168 hours. Coagulation Profile: No results for input(s): INR, PROTIME in the last 168 hours. Cardiac Enzymes: No results for input(s): CKTOTAL, CKMB, CKMBINDEX, TROPONINI in the last 168 hours. BNP (last 3 results) No results for input(s): PROBNP in the last 8760 hours. HbA1C: No results for input(s): HGBA1C in the last 72 hours. CBG: No results for input(s): GLUCAP in the last 168 hours. Lipid Profile: No results for input(s): CHOL, HDL, LDLCALC, TRIG, CHOLHDL, LDLDIRECT in the  last 72 hours. Thyroid Function Tests: No results for input(s): TSH, T4TOTAL, FREET4, T3FREE, THYROIDAB in the last 72 hours. Anemia Panel: No results for input(s): VITAMINB12, FOLATE, FERRITIN, TIBC, IRON, RETICCTPCT in the last 72 hours. Urine analysis:    Component Value Date/Time   COLORURINE YELLOW (A) 03/20/2019 1923   APPEARANCEUR CLOUDY (A) 03/20/2019 1923   APPEARANCEUR Hazy 01/27/2012 0502   LABSPEC 1.019 03/20/2019 1923   LABSPEC 1.013 01/27/2012 0502   PHURINE 6.0 03/20/2019 1923   GLUCOSEU 50 (A) 03/20/2019 1923   GLUCOSEU Negative 01/27/2012 0502   HGBUR MODERATE (A) 03/20/2019 1923   BILIRUBINUR NEGATIVE 03/20/2019 1923   BILIRUBINUR Negative 01/27/2012 0502   KETONESUR NEGATIVE 03/20/2019 1923   PROTEINUR >=300 (A) 03/20/2019 1923   NITRITE NEGATIVE 03/20/2019 1923   LEUKOCYTESUR LARGE (A) 03/20/2019 1923   LEUKOCYTESUR 3+ 01/27/2012 0502    Radiological Exams on Admission: CT Head Wo Contrast  Result Date: 03/21/2019 CLINICAL DATA:  Headache EXAM: CT HEAD WITHOUT CONTRAST CT CERVICAL SPINE WITHOUT CONTRAST TECHNIQUE: Multidetector CT imaging of the head and cervical spine was performed following the standard protocol without intravenous contrast. Multiplanar CT image reconstructions of the cervical spine were also generated. COMPARISON:  Head CT  02/20/2017 FINDINGS: CT HEAD FINDINGS Brain: There is no mass, hemorrhage or extra-axial collection. The size and configuration of the ventricles and extra-axial CSF spaces are normal. There is an area of hypoattenuation in the left parietal lobe (sagittal image 33) that is new compared to the prior study but otherwise age-indeterminate. There is periventricular hypoattenuation compatible with chronic microvascular disease. Vascular: No abnormal hyperdensity of the major intracranial arteries or dural venous sinuses. No intracranial atherosclerosis. Skull: The visualized skull base, calvarium and extracranial soft tissues are normal. Sinuses/Orbits: No fluid levels or advanced mucosal thickening of the visualized paranasal sinuses. No mastoid or middle ear effusion. The orbits are normal. CT CERVICAL SPINE FINDINGS Alignment: Reversal of normal cervical lordosis may be positional or due to muscle spasm. Grade 1 anterolisthesis at C4-5. Skull base and vertebrae: Moderate left C4-5 facet hypertrophy. Soft tissues and spinal canal: No prevertebral fluid or swelling. No visible canal hematoma. Disc levels: No advanced spinal canal or neural foraminal stenosis. Upper chest: No pneumothorax, pulmonary nodule or pleural effusion. Other: Normal visualized paraspinal cervical soft tissues. IMPRESSION: 1. Area of hypoattenuation in the left parietal lobe is new compared to the prior study but otherwise age-indeterminate. MRI is recommended for better characterization as is may indicate an acute or subacute infarct. 2. No intracranial hemorrhage. 3. No acute fracture of the cervical spine. 4. Reversal of normal cervical lordosis may be positional or due to muscle spasm. Electronically Signed   By: Ulyses Jarred M.D.   On: 03/21/2019 04:06   CT Cervical Spine Wo Contrast  Result Date: 03/21/2019 CLINICAL DATA:  Headache EXAM: CT HEAD WITHOUT CONTRAST CT CERVICAL SPINE WITHOUT CONTRAST TECHNIQUE: Multidetector CT imaging of  the head and cervical spine was performed following the standard protocol without intravenous contrast. Multiplanar CT image reconstructions of the cervical spine were also generated. COMPARISON:  Head CT 02/20/2017 FINDINGS: CT HEAD FINDINGS Brain: There is no mass, hemorrhage or extra-axial collection. The size and configuration of the ventricles and extra-axial CSF spaces are normal. There is an area of hypoattenuation in the left parietal lobe (sagittal image 33) that is new compared to the prior study but otherwise age-indeterminate. There is periventricular hypoattenuation compatible with chronic microvascular disease. Vascular: No abnormal hyperdensity of the major intracranial arteries  or dural venous sinuses. No intracranial atherosclerosis. Skull: The visualized skull base, calvarium and extracranial soft tissues are normal. Sinuses/Orbits: No fluid levels or advanced mucosal thickening of the visualized paranasal sinuses. No mastoid or middle ear effusion. The orbits are normal. CT CERVICAL SPINE FINDINGS Alignment: Reversal of normal cervical lordosis may be positional or due to muscle spasm. Grade 1 anterolisthesis at C4-5. Skull base and vertebrae: Moderate left C4-5 facet hypertrophy. Soft tissues and spinal canal: No prevertebral fluid or swelling. No visible canal hematoma. Disc levels: No advanced spinal canal or neural foraminal stenosis. Upper chest: No pneumothorax, pulmonary nodule or pleural effusion. Other: Normal visualized paraspinal cervical soft tissues. IMPRESSION: 1. Area of hypoattenuation in the left parietal lobe is new compared to the prior study but otherwise age-indeterminate. MRI is recommended for better characterization as is may indicate an acute or subacute infarct. 2. No intracranial hemorrhage. 3. No acute fracture of the cervical spine. 4. Reversal of normal cervical lordosis may be positional or due to muscle spasm. Electronically Signed   By: Ulyses Jarred M.D.   On:  03/21/2019 04:06   MR BRAIN WO CONTRAST  Result Date: 03/21/2019 CLINICAL DATA:  Stroke follow-up EXAM: MRI HEAD WITHOUT CONTRAST TECHNIQUE: Multiplanar, multiecho pulse sequences of the brain and surrounding structures were obtained without intravenous contrast. COMPARISON:  None. FINDINGS: Brain: There is abnormal diffusion restriction within the left parietal lobe. There are additional punctate foci of abnormal diffusion restriction scattered within the left frontal lobe, right thalamus, right occipital lobe and right cerebellum. No acute hemorrhage. No midline shift or other mass effect. Multifocal white matter hyperintensity, most commonly due to chronic ischemic microangiopathy. Normal volume of CSF spaces. Numerous chronic microhemorrhages in a predominantly central distribution. Normal midline structures. Vascular: Normal flow voids. Skull and upper cervical spine: Normal marrow signal. Sinuses/Orbits: Negative. Other: None. IMPRESSION: 1. Multifocal acute ischemia. Largest area is in the left parietal lobe. There are scattered punctate foci within both cerebral hemispheres and the right cerebellum involving multiple vascular territories. 2. No acute hemorrhage or mass effect. 3. Numerous chronic microhemorrhages in a predominantly central distribution, consistent with chronic hypertensive angiopathy. Electronically Signed   By: Ulyses Jarred M.D.   On: 03/21/2019 06:42   CT Renal Stone Study  Result Date: 03/21/2019 CLINICAL DATA:  Flank pain EXAM: CT ABDOMEN AND PELVIS WITHOUT CONTRAST TECHNIQUE: Multidetector CT imaging of the abdomen and pelvis was performed following the standard protocol without IV contrast. COMPARISON:  09/14/2015 FINDINGS: Lower chest: Densely calcified coronary arteries, mitral valve, and visualized descending thoracic aorta. No acute abnormality. Hepatobiliary: Prior cholecystectomy.  No focal hepatic abnormality. Pancreas: No focal abnormality or ductal dilatation. Spleen:  No focal abnormality.  Normal size. Adrenals/Urinary Tract: Atrophic right kidney, stable. Exophytic cyst off the upper pole of the left kidney measures 4 cm. No ureteral stones or hydronephrosis. Urinary bladder and adrenal glands unremarkable. Stomach/Bowel: Bowel grossly unremarkable. No evidence of bowel obstruction. Evaluation limited with lack of oral and intravenous contrast and respiratory motion. Vascular/Lymphatic: Aortic atherosclerosis. No enlarged abdominal or pelvic lymph nodes. Reproductive: Unremarkable. Other: No free fluid or free air. Musculoskeletal: Numerous severe compression fractures, chronic and stable at T11 and L4. As well as at L5, new since 2017. IMPRESSION: No renal or ureteral stones.  No hydronephrosis. Atrophic right kidney, stable. Diffuse coronary artery disease.  Heavily calcified aorta. Chronic compression fractures at T11 and L4. Age-indeterminate compression fracture at L5, new since 2017. Electronically Signed   By: Rolm Baptise M.D.  On: 03/21/2019 03:57    EKG: Independently reviewed.  Sinus rhythm LVH  Assessment/Plan Principal Problem:   CVA (cerebral vascular accident) ([place]) Active Problems:   Acquired hypothyroidism   Essential hypertension   UTI (urinary tract infection)   Alzheimer's dementia ([place])   AF (paroxysmal atrial fibrillation) ([facility])   Acute CVA (cerebrovascular accident) ([place])   Anemia due to stage 4 chronic kidney disease ([place])    Acute CVA Patient presented for evaluation of mental status changes described as worsening confusion by family.   She has a known history of paroxysmal atrial fibrillation She had a CT scan of the head done without contrast which showed an area of hypoattenuation in the left parietal lobe that is new compared to the prior study but otherwise age-indeterminate. MRI was recommended for better characterization and it showed multifocal acute ischemia. Largest area is in the left parietal lobe. There are  scattered punctate foci within both cerebralhemispheres and the right cerebellum involving multiple vascularterritories. No acute hemorrhage or mass effect. Numerous chronic microhemorrhages in a predominantly central distribution, consistent with chronic hypertensive angiopathy. Will place patient on aspirin 325mg  daily Allow for permissive hypertension Will consult neurology Will consult cardiology for possible TEE Place patient on high intensity statin Will request speech therapy, occupational therapy and physical therapy evaluation Patient started on IV heparin per cardiology recommendation   Urinary tract infection Patient with significant pyuria Will patient empirically with Rocephin 1 g IV daily until urine culture results become available   History of paroxysmal atrial fibrillation Patient is supposed to be on Eliquis as secondary prophylaxis for an acute CVA and has been complaint Hold Eliquis for now    Chronic kidney disease stage IV Stable   Hypertension Hold all antihypertensive medications for now Allow for permissive hypertension due to acute CVA  Hypothyroidism Continue Synthroid   DVT prophylaxis: SCD Code Status: Full code Family Communication: Plan of care was discussed with patient's daughter, Ms Addison Bailey over the phone. All questions and concerns have been addressed. She verbalizes understanding and agrees with the plan Disposition Plan: Back to previous home environment Consults called: Neurology, Cardiology    Flossie Wexler MD [facility]     03/21/2019, 1:12 PM

## 2019-03-21 NOTE — Consult Note (Signed)
Requesting Physician: Agbata    Chief Complaint: AMS  I have been asked by Dr. Francine Graven to see this patient in consultation for acute infarcts.  HPI: Haley Brooks is an 86 y.o. female who due to her dementia is unable to provide any history therefore all history obtained from the chart. Patient with a past medical history significant for Alzheimer's dementia, PAF on Eliquis and ASA, hypertension, thyroid disease who comes in with concerns for altered mental status.  Over the past few days daughter has noted that she is more confused than her normal.  She just is more repetitive.  Brought in for evaluation by daughter.  Initial NIHSS of 1.    Date last known well: Unable to determine Time last known well: Unable to determine tPA Given: No: Unable to determine LKW, on Eliquis  Past Medical History:  Diagnosis Date  . [facility]    colon cancer   . Hemorrhoid   . Hypertension   . Thyroid disease     Past Surgical History:  Procedure Laterality Date  . COLON SURGERY    . FRACTURE SURGERY      Family History  Problem Relation Age of Onset  . Colon cancer Mother   . Diabetes Father   . Cancer Brother    Social History:  reports that she has never smoked. She has never used smokeless tobacco. She reports that she does not drink alcohol or use drugs.  Allergies: No Known Allergies  Medications: I have reviewed the patient's current medications. Prior to Admission medications   Medication Sig Start Date End Date Taking? Authorizing Provider  alendronate (FOSAMAX) 70 MG tablet Take 1 tablet by mouth once a week.  03/25/15  Yes [provider]  amLODipine (NORVASC) 10 MG tablet Take 10 mg by mouth daily.    Yes [provider]  apixaban (ELIQUIS) 5 MG TABS tablet Take 5 mg by mouth 2 (two) times daily.   Yes [provider]  aspirin 325 MG EC tablet Take 325 mg by mouth daily.    Yes [provider]  atorvastatin (LIPITOR) 20 MG tablet Take 20  mg by mouth daily.   Yes [provider]  benazepril (LOTENSIN) 40 MG tablet Take 1 tablet (40 mg total) by mouth daily. 02/22/17  Yes Vaughan Basta, MD  donepezil (ARICEPT) 10 MG tablet Take 1 tablet by mouth 2 (two) times daily. 12/21/16  Yes [provider]  escitalopram (LEXAPRO) 10 MG tablet Take 10 mg by mouth daily. 01/16/19  Yes [provider]  ferrous sulfate 325 (65 FE) MG tablet Take 325 mg by mouth 2 (two) times daily with a meal.   Yes [provider]  levothyroxine (SYNTHROID) 150 MCG tablet Take 1 tablet by mouth daily in the afternoon. 12/31/18  Yes [provider]  memantine (NAMENDA) 10 MG tablet Take 10 mg by mouth 2 (two) times daily. 12/31/18  Yes [provider]  metoprolol succinate (TOPROL-XL) 25 MG 24 hr tablet Take 25 mg by mouth daily.   Yes [provider]  PREMARIN vaginal cream INSERT 1 APPLICATORFUL  VAGINALLY TWICE WEEKLY Patient not taking: Reported on 03/21/2019 11/19/17   [name], MD    ROS: History obtained from the patient  General ROS: negative for - chills, fatigue, fever, night sweats, weight gain or weight loss Psychological ROS: memory difficulties Ophthalmic ROS: negative for - blurry vision, double vision, eye pain or loss of vision ENT ROS: negative for - epistaxis,  nasal discharge, oral lesions, sore throat, tinnitus or vertigo Allergy and Immunology ROS: negative for - hives or itchy/watery eyes Hematological and Lymphatic ROS: negative for - bleeding problems, bruising or swollen lymph nodes Endocrine ROS: negative for - galactorrhea, hair pattern changes, polydipsia/polyuria or temperature intolerance Respiratory ROS: negative for - cough, hemoptysis, shortness of breath or wheezing Cardiovascular ROS: negative for - chest pain, dyspnea on exertion, edema or irregular heartbeat Gastrointestinal ROS: negative for - abdominal pain, diarrhea, hematemesis,  nausea/vomiting or stool incontinence Genito-Urinary ROS: negative for - dysuria, hematuria, incontinence or urinary frequency/urgency Musculoskeletal ROS: negative for - joint swelling or muscular weakness Neurological ROS: as noted in HPI Dermatological ROS: negative for rash and skin lesion changes  Physical Examination: Blood pressure (!) 159/78, pulse (!) 52, temperature 98.3 F (36.8 C), temperature source Oral, resp. rate 17, height 5\' 4"  (1.626 m), weight 63.5 kg, SpO2 95 %.  HEENT-  Normocephalic, no lesions, without obvious abnormality.  Normal external eye and conjunctiva.  Normal TM's bilaterally.  Normal auditory canals and external ears. Normal external nose, mucus membranes and septum.  Normal pharynx. Cardiovascular- S1, S2 normal, pulses palpable throughout   Lungs- chest clear, no wheezing, rales, normal symmetric air entry Abdomen- soft, non-tender; bowel sounds normal; no masses,  no organomegaly Extremities- no edema Lymph-no adenopathy palpable Musculoskeletal-no joint tenderness, deformity or swelling Skin-warm and dry, no hyperpigmentation, vitiligo, or suspicious lesions  Neurological Examination   Mental Status: Alert, disoriented.  Speech fluent without evidence of aphasia.  Able to follow simple commands without difficulty.  Requires reinforcement for 3-step commands Cranial Nerves: II: Discs flat bilaterally; Visual fields grossly normal, pupils equal, round, reactive to light and accommodation III,IV, VI: ptosis not present, extra-ocular motions intact bilaterally V,VII: right facial droop, facial light touch sensation normal bilaterally VIII: hearing normal bilaterally IX,X: gag reflex present XI: bilateral shoulder shrug XII: midline tongue extension Motor: Right : Upper extremity   5/5    Left:     Upper extremity   5/5  Lower extremity   5/5     Lower extremity   5/5 Tone and bulk:normal tone throughout; no atrophy noted Sensory: Pinprick and light  touch intact throughout, bilaterally Deep Tendon Reflexes: symmetric throughout Plantars: Right: mute   Left: mute Cerebellar: Normal finger-to-nose and normal heel-to-shin testing bilaterally Gait: not tested due to safety concerns  Laboratory Studies:  Basic Metabolic Panel: Recent Labs  Lab 03/20/19 1923  NA 139  K 3.7  CL 101  CO2 30  GLUCOSE 110*  BUN 27*  CREATININE 1.51*  CALCIUM 9.4    Liver Function Tests: Recent Labs  Lab 03/20/19 1923  AST 32  ALT 17  ALKPHOS 71  BILITOT 0.6  PROT 7.0  ALBUMIN 3.4*   No results for input(s): LIPASE, AMYLASE in the last 168 hours. No results for input(s): AMMONIA in the last 168 hours.  CBC: Recent Labs  Lab 03/20/19 1923  WBC 8.9  HGB 12.1  HCT 37.7  MCV 92.4  PLT 182    Cardiac Enzymes: No results for input(s): CKTOTAL, CKMB, CKMBINDEX, TROPONINI in the last 168 hours.  BNP: Invalid input(s): POCBNP  CBG: No results for input(s): GLUCAP in the last 168 hours.  Microbiology: Results for orders placed or performed during the hospital encounter of 02/18/17  Urine Culture     Status: Abnormal   Collection Time: 02/18/17  6:28 PM   Specimen: Urine, Random  Result Value Ref Range Status   Specimen Description  Final    URINE, RANDOM Performed at [facility], [address] [place], [place] [address]    Special Requests   Final    NONE Performed at [facility], [address] [place], [place] [address]    Culture MULTIPLE SPECIES PRESENT, SUGGEST RECOLLECTION (A)  Final   Report Status 02/20/2017 FINAL  Final    Coagulation Studies: No results for input(s): LABPROT, INR in the last 72 hours.  Urinalysis:  Recent Labs  Lab 03/20/19 1923  COLORURINE YELLOW*  LABSPEC 1.019  PHURINE 6.0  GLUCOSEU 50*  HGBUR MODERATE*  BILIRUBINUR NEGATIVE  KETONESUR NEGATIVE  PROTEINUR >=300*  NITRITE NEGATIVE  LEUKOCYTESUR LARGE*    Lipid Panel: No results found for: CHOL, TRIG,  HDL, CHOLHDL, VLDL, LDLCALC  HgbA1C: No results found for: HGBA1C  Urine Drug Screen:  No results found for: LABOPIA, COCAINSCRNUR, LABBENZ, AMPHETMU, THCU, LABBARB  Alcohol Level: No results for input(s): ETH in the last 168 hours.  Other results: EKG: atrial fibrillation, rate 52 bpm.  Imaging: CT Head Wo Contrast  Result Date: 03/21/2019 CLINICAL DATA:  Headache EXAM: CT HEAD WITHOUT CONTRAST CT CERVICAL SPINE WITHOUT CONTRAST TECHNIQUE: Multidetector CT imaging of the head and cervical spine was performed following the standard protocol without intravenous contrast. Multiplanar CT image reconstructions of the cervical spine were also generated. COMPARISON:  Head CT 02/20/2017 FINDINGS: CT HEAD FINDINGS Brain: There is no mass, hemorrhage or extra-axial collection. The size and configuration of the ventricles and extra-axial CSF spaces are normal. There is an area of hypoattenuation in the left parietal lobe (sagittal image 33) that is new compared to the prior study but otherwise age-indeterminate. There is periventricular hypoattenuation compatible with chronic microvascular disease. Vascular: No abnormal hyperdensity of the major intracranial arteries or dural venous sinuses. No intracranial atherosclerosis. Skull: The visualized skull base, calvarium and extracranial soft tissues are normal. Sinuses/Orbits: No fluid levels or advanced mucosal thickening of the visualized paranasal sinuses. No mastoid or middle ear effusion. The orbits are normal. CT CERVICAL SPINE FINDINGS Alignment: Reversal of normal cervical lordosis may be positional or due to muscle spasm. Grade 1 anterolisthesis at C4-5. Skull base and vertebrae: Moderate left C4-5 facet hypertrophy. Soft tissues and spinal canal: No prevertebral fluid or swelling. No visible canal hematoma. Disc levels: No advanced spinal canal or neural foraminal stenosis. Upper chest: No pneumothorax, pulmonary nodule or pleural effusion. Other: Normal  visualized paraspinal cervical soft tissues. IMPRESSION: 1. Area of hypoattenuation in the left parietal lobe is new compared to the prior study but otherwise age-indeterminate. MRI is recommended for better characterization as is may indicate an acute or subacute infarct. 2. No intracranial hemorrhage. 3. No acute fracture of the cervical spine. 4. Reversal of normal cervical lordosis may be positional or due to muscle spasm. Electronically Signed   By: Ulyses Jarred M.D.   On: 03/21/2019 04:06   CT Cervical Spine Wo Contrast  Result Date: 03/21/2019 CLINICAL DATA:  Headache EXAM: CT HEAD WITHOUT CONTRAST CT CERVICAL SPINE WITHOUT CONTRAST TECHNIQUE: Multidetector CT imaging of the head and cervical spine was performed following the standard protocol without intravenous contrast. Multiplanar CT image reconstructions of the cervical spine were also generated. COMPARISON:  Head CT 02/20/2017 FINDINGS: CT HEAD FINDINGS Brain: There is no mass, hemorrhage or extra-axial collection. The size and configuration of the ventricles and extra-axial CSF spaces are normal. There is an area of hypoattenuation in the left parietal lobe (sagittal image 33) that is new compared to  the prior study but otherwise age-indeterminate. There is periventricular hypoattenuation compatible with chronic microvascular disease. Vascular: No abnormal hyperdensity of the major intracranial arteries or dural venous sinuses. No intracranial atherosclerosis. Skull: The visualized skull base, calvarium and extracranial soft tissues are normal. Sinuses/Orbits: No fluid levels or advanced mucosal thickening of the visualized paranasal sinuses. No mastoid or middle ear effusion. The orbits are normal. CT CERVICAL SPINE FINDINGS Alignment: Reversal of normal cervical lordosis may be positional or due to muscle spasm. Grade 1 anterolisthesis at C4-5. Skull base and vertebrae: Moderate left C4-5 facet hypertrophy. Soft tissues and spinal canal: No  prevertebral fluid or swelling. No visible canal hematoma. Disc levels: No advanced spinal canal or neural foraminal stenosis. Upper chest: No pneumothorax, pulmonary nodule or pleural effusion. Other: Normal visualized paraspinal cervical soft tissues. IMPRESSION: 1. Area of hypoattenuation in the left parietal lobe is new compared to the prior study but otherwise age-indeterminate. MRI is recommended for better characterization as is may indicate an acute or subacute infarct. 2. No intracranial hemorrhage. 3. No acute fracture of the cervical spine. 4. Reversal of normal cervical lordosis may be positional or due to muscle spasm. Electronically Signed   By: Ulyses Jarred M.D.   On: 03/21/2019 04:06   MR BRAIN WO CONTRAST  Result Date: 03/21/2019 CLINICAL DATA:  Stroke follow-up EXAM: MRI HEAD WITHOUT CONTRAST TECHNIQUE: Multiplanar, multiecho pulse sequences of the brain and surrounding structures were obtained without intravenous contrast. COMPARISON:  None. FINDINGS: Brain: There is abnormal diffusion restriction within the left parietal lobe. There are additional punctate foci of abnormal diffusion restriction scattered within the left frontal lobe, right thalamus, right occipital lobe and right cerebellum. No acute hemorrhage. No midline shift or other mass effect. Multifocal white matter hyperintensity, most commonly due to chronic ischemic microangiopathy. Normal volume of CSF spaces. Numerous chronic microhemorrhages in a predominantly central distribution. Normal midline structures. Vascular: Normal flow voids. Skull and upper cervical spine: Normal marrow signal. Sinuses/Orbits: Negative. Other: None. IMPRESSION: 1. Multifocal acute ischemia. Largest area is in the left parietal lobe. There are scattered punctate foci within both cerebral hemispheres and the right cerebellum involving multiple vascular territories. 2. No acute hemorrhage or mass effect. 3. Numerous chronic microhemorrhages in a  predominantly central distribution, consistent with chronic hypertensive angiopathy. Electronically Signed   By: Ulyses Jarred M.D.   On: 03/21/2019 06:42   CT Renal Stone Study  Result Date: 03/21/2019 CLINICAL DATA:  Flank pain EXAM: CT ABDOMEN AND PELVIS WITHOUT CONTRAST TECHNIQUE: Multidetector CT imaging of the abdomen and pelvis was performed following the standard protocol without IV contrast. COMPARISON:  09/14/2015 FINDINGS: Lower chest: Densely calcified coronary arteries, mitral valve, and visualized descending thoracic aorta. No acute abnormality. Hepatobiliary: Prior cholecystectomy.  No focal hepatic abnormality. Pancreas: No focal abnormality or ductal dilatation. Spleen: No focal abnormality.  Normal size. Adrenals/Urinary Tract: Atrophic right kidney, stable. Exophytic cyst off the upper pole of the left kidney measures 4 cm. No ureteral stones or hydronephrosis. Urinary bladder and adrenal glands unremarkable. Stomach/Bowel: Bowel grossly unremarkable. No evidence of bowel obstruction. Evaluation limited with lack of oral and intravenous contrast and respiratory motion. Vascular/Lymphatic: Aortic atherosclerosis. No enlarged abdominal or pelvic lymph nodes. Reproductive: Unremarkable. Other: No free fluid or free air. Musculoskeletal: Numerous severe compression fractures, chronic and stable at T11 and L4. As well as at L5, new since 2017. IMPRESSION: No renal or ureteral stones.  No hydronephrosis. Atrophic right kidney, stable. Diffuse coronary artery disease.  Heavily calcified aorta.  Chronic compression fractures at T11 and L4. Age-indeterminate compression fracture at L5, new since 2017. Electronically Signed   By: Rolm Baptise M.D.   On: 03/21/2019 03:57    Assessment: 86 y.o. female with a past medical history significant for Alzheimer's dementia, PAF on Eliquis and ASA, hypertension, thyroid disease who comes in with altered mental status for the past few days.  MRI of the brain  personally reviewed and shows acute, small infarcts in multiple vascular territories.  Embolic etiology likely.  Patient on ASA and Eliquis.  Due to the fact that patient on maximum medical therapy will attempt to address other risk factors to decrease stroke recurrence.    Stroke Risk Factors - atrial fibrillation, hyperlipidemia and hypertension  Plan: 1. HgbA1c, fasting lipid panel 2. Confirm medication compliance.   3. PT consult, OT consult, Speech consult 4. Echocardiogram 5. Carotid dopplers 6. Prophylactic therapy-Continue anticoagulation and ASA 7. NPO until RN stroke swallow screen 8. Telemetry monitoring 9. Frequent neuro checks 10. Cardiology following patient   Alexis Goodell, MD Neurology [phone] 03/21/2019, 11:57 AM

## 2019-03-21 NOTE — Progress Notes (Signed)
Speech Therapy Note: Nursing reports no difficulty with current diet and is taking meds PO without difficulty.  ST will see when time presents and patient is available.  Leroy Sea, MS/CCC- SLP

## 2019-03-21 NOTE — ED Provider Notes (Signed)
[facility] Emergency Department Provider Note  ____________________________________________   First MD Initiated Contact with Patient 03/21/19 0200     (approximate)  I have reviewed the triage vital signs and the nursing notes.   HISTORY  Chief Complaint Altered Mental Status    HPI Haley Brooks is a 86 y.o. female with hypertension, thyroid disease who comes in with concerns for altered mental status.  Over the past few days daughter has noted that she is more confused than her normal.  She just is more repetitive.  Not as severe as prior but has occasionally happened before due to UTIs.  She went to make sure that she did not have a UTI today.  Patient is on a blood thinner, Eliquis and did have a fall today and was complaining of a little bit of back pain but denies any currently.  Patient does have a history of kidney stones.  Denies any chest pain or shortness of breath          Past Medical History:  Diagnosis Date  . Cancer [facility])    colon cancer   . Hemorrhoid   . Hypertension   . Thyroid disease     Patient Active Problem List   Diagnosis Date Noted  . Acute delirium 02/21/2017  . Alzheimer's dementia ([place]) 02/21/2017  . Acute encephalopathy 02/18/2017  . Acute metabolic encephalopathy 28/31/5176  . UTI (urinary tract infection) 12/24/2016  . Hemorrhoid   . History of colon cancer 04/05/2015  . Chronic anemia 03/10/2015  . Osteopenia 12/11/2014  . CKD (chronic kidney disease) stage 3, GFR 30-59 ml/min 06/18/2014  . SDAT (senile dementia of Alzheimer's type) ([place]) 06/18/2014  . Acquired hypothyroidism 03/01/2014  . Essential hypertension 03/01/2014  . Mixed hyperlipidemia 03/01/2014    Past Surgical History:  Procedure Laterality Date  . COLON SURGERY    . FRACTURE SURGERY      Prior to Admission medications   Medication Sig Start Date End Date Taking? Authorizing Provider  alendronate (FOSAMAX) 70 MG tablet Take 1  tablet by mouth 1 day or 1 dose. 03/25/15   [provider]  amLODipine (NORVASC) 10 MG tablet Take 10 mg by mouth daily.     [provider]  apixaban (ELIQUIS) 5 MG TABS tablet Take 5 mg by mouth 2 (two) times daily.    [provider]  aspirin 325 MG EC tablet Take 325 mg by mouth daily.     [provider]  atorvastatin (LIPITOR) 20 MG tablet Take 20 mg by mouth daily.    [provider]  benazepril (LOTENSIN) 40 MG tablet Take 1 tablet (40 mg total) by mouth daily. 02/22/17   Vaughan Basta, MD  donepezil (ARICEPT) 10 MG tablet Take 1 tablet by mouth 2 (two) times daily. 12/21/16   [provider]  ferrous sulfate 325 (65 FE) MG tablet Take 325 mg by mouth 2 (two) times daily with a meal.    [provider]  levothyroxine (SYNTHROID, LEVOTHROID) 200 MCG tablet Take 1 tablet (200 mcg total) by mouth daily before breakfast. 02/22/17   Vaughan Basta, MD  metoprolol succinate (TOPROL-XL) 25 MG 24 hr tablet Take 25 mg by mouth daily.    [provider]  PREMARIN vaginal cream INSERT 1 APPLICATORFUL  VAGINALLY TWICE WEEKLY 11/19/17   [name], MD    Allergies Patient has no known allergies.  Family History  Problem Relation Age of Onset  . Colon cancer Mother   .  Diabetes Father   . Cancer Brother     Social History Social History   Tobacco Use  . Smoking status: Never Smoker  . Smokeless tobacco: Never Used  Substance Use Topics  . Alcohol use: No  . Drug use: No      Review of Systems Constitutional: No fever/chills, fall Eyes: No visual changes. ENT: No sore throat. Cardiovascular: Denies chest pain. Respiratory: Denies shortness of breath. Gastrointestinal: No abdominal pain.  No nausea, no vomiting.  No diarrhea.  No constipation. Genitourinary: Negative for dysuria. Musculoskeletal: Intermittent back pain Skin: Negative for rash. Neurological: Negative for headaches, focal  weakness or numbness.  Confusion All other ROS negative ____________________________________________   PHYSICAL EXAM:  VITAL SIGNS: ED Triage Vitals  Enc Vitals Group     BP 03/20/19 1919 (!) 158/76     Pulse Rate 03/20/19 1919 67     Resp 03/20/19 1919 18     Temp 03/20/19 1919 98.3 F (36.8 C)     Temp Source 03/20/19 1919 Oral     SpO2 03/20/19 1919 97 %     Weight 03/20/19 1920 140 lb (63.5 kg)     Height 03/20/19 1920 5\' 4"  (1.626 m)     Head Circumference --      Peak Flow --      Pain Score 03/20/19 1919 0     Pain Loc --      Pain Edu? --      Excl. in [place]? --     Constitutional: Alert and oriented x2. Well appearing and in no acute distress. Eyes: Conjunctivae are normal. EOMI. Head: Atraumatic. Nose: No congestion/rhinnorhea. Mouth/Throat: Mucous membranes are moist.   Neck: No stridor. Trachea Midline. FROM Cardiovascular: Normal rate, regular rhythm. Grossly normal heart sounds.  Good peripheral circulation. Respiratory: Normal respiratory effort.  No retractions. Lungs CTAB. Gastrointestinal: Soft and nontender. No distention. No abdominal bruits.  Musculoskeletal: No lower extremity tenderness nor edema.  No joint effusions. Neurologic:  Normal speech and language. No gross focal neurologic deficits are appreciated.  Skin:  Skin is warm, dry and intact. No rash noted. Psychiatric: Mood and affect are normal. Speech and behavior are normal. GU: Deferred  No CTL spine tenderness at this time ____________________________________________   LABS (all labs ordered are listed, but only abnormal results are displayed)  Labs Reviewed  COMPREHENSIVE METABOLIC PANEL - Abnormal; Notable for the following components:      Result Value   Glucose, Bld 110 (*)    BUN 27 (*)    Creatinine, Ser 1.51 (*)    Albumin 3.4 (*)    GFR calc non Af Amer 31 (*)    GFR calc Af Amer 36 (*)    All other components within normal limits  URINALYSIS, COMPLETE (UACMP) WITH  MICROSCOPIC - Abnormal; Notable for the following components:   Color, Urine YELLOW (*)    APPearance CLOUDY (*)    Glucose, UA 50 (*)    Hgb urine dipstick MODERATE (*)    Protein, ur >=300 (*)    Leukocytes,Ua LARGE (*)    WBC, UA >50 (*)    Bacteria, UA RARE (*)    All other components within normal limits  CBC  TROPONIN I (HIGH SENSITIVITY)   ____________________________________________   ED ECG REPORT I, Vanessa Onsted, the attending physician, personally viewed and interpreted this ECG.  Sinus bradycardia rate of 57, no ST elevation, T wave inversions in 1, 2, aVL, V2, V4 V5 and V6,  type I AV block. Looks like she is had some these T wave inversions previously ____________________________________________  RADIOLOGY   Official radiology report(s): CT Head Wo Contrast  Result Date: 03/21/2019 CLINICAL DATA:  Headache EXAM: CT HEAD WITHOUT CONTRAST CT CERVICAL SPINE WITHOUT CONTRAST TECHNIQUE: Multidetector CT imaging of the head and cervical spine was performed following the standard protocol without intravenous contrast. Multiplanar CT image reconstructions of the cervical spine were also generated. COMPARISON:  Head CT 02/20/2017 FINDINGS: CT HEAD FINDINGS Brain: There is no mass, hemorrhage or extra-axial collection. The size and configuration of the ventricles and extra-axial CSF spaces are normal. There is an area of hypoattenuation in the left parietal lobe (sagittal image 33) that is new compared to the prior study but otherwise age-indeterminate. There is periventricular hypoattenuation compatible with chronic microvascular disease. Vascular: No abnormal hyperdensity of the major intracranial arteries or dural venous sinuses. No intracranial atherosclerosis. Skull: The visualized skull base, calvarium and extracranial soft tissues are normal. Sinuses/Orbits: No fluid levels or advanced mucosal thickening of the visualized paranasal sinuses. No mastoid or middle ear effusion. The  orbits are normal. CT CERVICAL SPINE FINDINGS Alignment: Reversal of normal cervical lordosis may be positional or due to muscle spasm. Grade 1 anterolisthesis at C4-5. Skull base and vertebrae: Moderate left C4-5 facet hypertrophy. Soft tissues and spinal canal: No prevertebral fluid or swelling. No visible canal hematoma. Disc levels: No advanced spinal canal or neural foraminal stenosis. Upper chest: No pneumothorax, pulmonary nodule or pleural effusion. Other: Normal visualized paraspinal cervical soft tissues. IMPRESSION: 1. Area of hypoattenuation in the left parietal lobe is new compared to the prior study but otherwise age-indeterminate. MRI is recommended for better characterization as is may indicate an acute or subacute infarct. 2. No intracranial hemorrhage. 3. No acute fracture of the cervical spine. 4. Reversal of normal cervical lordosis may be positional or due to muscle spasm. Electronically Signed   By: Ulyses Jarred M.D.   On: 03/21/2019 04:06   CT Cervical Spine Wo Contrast  Result Date: 03/21/2019 CLINICAL DATA:  Headache EXAM: CT HEAD WITHOUT CONTRAST CT CERVICAL SPINE WITHOUT CONTRAST TECHNIQUE: Multidetector CT imaging of the head and cervical spine was performed following the standard protocol without intravenous contrast. Multiplanar CT image reconstructions of the cervical spine were also generated. COMPARISON:  Head CT 02/20/2017 FINDINGS: CT HEAD FINDINGS Brain: There is no mass, hemorrhage or extra-axial collection. The size and configuration of the ventricles and extra-axial CSF spaces are normal. There is an area of hypoattenuation in the left parietal lobe (sagittal image 33) that is new compared to the prior study but otherwise age-indeterminate. There is periventricular hypoattenuation compatible with chronic microvascular disease. Vascular: No abnormal hyperdensity of the major intracranial arteries or dural venous sinuses. No intracranial atherosclerosis. Skull: The  visualized skull base, calvarium and extracranial soft tissues are normal. Sinuses/Orbits: No fluid levels or advanced mucosal thickening of the visualized paranasal sinuses. No mastoid or middle ear effusion. The orbits are normal. CT CERVICAL SPINE FINDINGS Alignment: Reversal of normal cervical lordosis may be positional or due to muscle spasm. Grade 1 anterolisthesis at C4-5. Skull base and vertebrae: Moderate left C4-5 facet hypertrophy. Soft tissues and spinal canal: No prevertebral fluid or swelling. No visible canal hematoma. Disc levels: No advanced spinal canal or neural foraminal stenosis. Upper chest: No pneumothorax, pulmonary nodule or pleural effusion. Other: Normal visualized paraspinal cervical soft tissues. IMPRESSION: 1. Area of hypoattenuation in the left parietal lobe is new compared to the prior study  but otherwise age-indeterminate. MRI is recommended for better characterization as is may indicate an acute or subacute infarct. 2. No intracranial hemorrhage. 3. No acute fracture of the cervical spine. 4. Reversal of normal cervical lordosis may be positional or due to muscle spasm. Electronically Signed   By: Ulyses Jarred M.D.   On: 03/21/2019 04:06   CT Renal Stone Study  Result Date: 03/21/2019 CLINICAL DATA:  Flank pain EXAM: CT ABDOMEN AND PELVIS WITHOUT CONTRAST TECHNIQUE: Multidetector CT imaging of the abdomen and pelvis was performed following the standard protocol without IV contrast. COMPARISON:  09/14/2015 FINDINGS: Lower chest: Densely calcified coronary arteries, mitral valve, and visualized descending thoracic aorta. No acute abnormality. Hepatobiliary: Prior cholecystectomy.  No focal hepatic abnormality. Pancreas: No focal abnormality or ductal dilatation. Spleen: No focal abnormality.  Normal size. Adrenals/Urinary Tract: Atrophic right kidney, stable. Exophytic cyst off the upper pole of the left kidney measures 4 cm. No ureteral stones or hydronephrosis. Urinary bladder  and adrenal glands unremarkable. Stomach/Bowel: Bowel grossly unremarkable. No evidence of bowel obstruction. Evaluation limited with lack of oral and intravenous contrast and respiratory motion. Vascular/Lymphatic: Aortic atherosclerosis. No enlarged abdominal or pelvic lymph nodes. Reproductive: Unremarkable. Other: No free fluid or free air. Musculoskeletal: Numerous severe compression fractures, chronic and stable at T11 and L4. As well as at L5, new since 2017. IMPRESSION: No renal or ureteral stones.  No hydronephrosis. Atrophic right kidney, stable. Diffuse coronary artery disease.  Heavily calcified aorta. Chronic compression fractures at T11 and L4. Age-indeterminate compression fracture at L5, new since 2017. Electronically Signed   By: Rolm Baptise M.D.   On: 03/21/2019 03:57    ____________________________________________   PROCEDURES  Procedure(s) performed (including Critical Care):  Procedures   ____________________________________________   INITIAL IMPRESSION / ASSESSMENT AND PLAN / ED COURSE  WILMARY LEVIT was evaluated in Emergency Department on 03/21/2019 for the symptoms described in the history of present illness. She was evaluated in the context of the global COVID-19 pandemic, which necessitated consideration that the patient might be at risk for infection with the SARS-CoV-2 virus that causes COVID-19. Institutional protocols and algorithms that pertain to the evaluation of patients at risk for COVID-19 are in a state of rapid change based on information released by regulatory bodies including the CDC and federal and state organizations. These policies and algorithms were followed during the patient's care in the ED.    Patient is a 86 year old who comes in with some confusion and concerns for possible recurrent UTI.  Will get labs to evaluate for electrolyte abnormalities, AKI, UTI.  Given the recent fall and patient is on a blood thinner will get CT head to evaluate  for intracranial hemorrhage and CT cervical to evaluate for cervical fracture.  No obvious back tenderness at this time but family does report a history of kidney stones and given some intermittent back pain will get a CT renal to make sure no evidence of infected kidney stone.  Kidney function is elevated at 1.5 a little bit higher than her baseline.  Urine consistent with UTI.  Does not have other sepsis criteria.  CT head was concerning for possible acute for subacute infarct.  Recommended MRI.  Will order.  No evidence of kidney stone on CT renal.  Discussed with family admission versus discharge.  Given patient lives home by herself with the increasing confusion and the fall family would prefer patient to be admitted for IV antibiotics.  We will discuss with the hospital team for  admission       ____________________________________________   FINAL CLINICAL IMPRESSION(S) / ED DIAGNOSES   Final diagnoses:  Altered mental status, unspecified altered mental status type  Acute cystitis without hematuria  AKI (acute kidney injury) ([place])      MEDICATIONS GIVEN DURING THIS VISIT:  Medications  cefTRIAXone (ROCEPHIN) 1 g in sodium chloride 0.9 % 100 mL IVPB (1 g Intravenous New Bag/Given 03/21/19 0405)  sodium chloride 0.9 % bolus 500 mL (500 mLs Intravenous New Bag/Given 03/21/19 0405)     ED Discharge Orders    None       Note:  This document was prepared using Dragon voice recognition software and may include unintentional dictation errors.   Vanessa Blackstone, MD 03/21/19 [phone]

## 2019-03-21 NOTE — ED Notes (Signed)
PT at bedside.

## 2019-03-21 NOTE — ED Notes (Signed)
Meal tray given to pt. Food cut and set up for pt.  No other needs at this time.

## 2019-03-21 NOTE — ED Notes (Signed)
Pt ate 100% tray

## 2019-03-21 NOTE — ED Notes (Addendum)
Pt sitting up in bed eating lunch. No needs. NAD. Remains to wait on admit bed. Lunch tray headed.

## 2019-03-21 NOTE — ED Notes (Signed)
Called pharm for thyroid meds.

## 2019-03-21 NOTE — Progress Notes (Signed)
Physical Therapy Evaluation Patient Details Name: Haley Brooks MRN: [id number] DOB: June 11, 1931 Today's Date: 03/21/2019   History of Present Illness  Per MD note:86 year old female history of dementia paroxysmal atrial fibrillation atrial flutter on supposedly Eliquis 5 mg twice a day hypertension renal insufficiency thyroid disease dementia brought to emergency room with a change in mental status.  Daughter states that the patient started behaving more confused they were concerned that she may have a urinary tract infection patient underwent CT of the head which showed new hypoattenuation in the left parietal lobe patient then had MRI which suggested left parietal lobe multiple areas of acute ischemia scattered punctate foci within both cerebral hemispheres and right cerebral and multiple vascular territories.  Because the patient dementia she is not able to help with most of the history there is been no history of chest pain no shortness of breath no blackout spells or syncope.  Clinical Impression  Patient agrees to PT evaluation. She needs min assist with supine<> sit bed mobility and transfers sit to stand with min assist and SPC. She has unsteady static and dynamic standing balance with spc. She ambulates 10 feet with spc and min assist. She has a RW at home and would be less unsteady if she used a  RW for support. She has fair strength BLE . She reports that she lives alone and her daughter lives nearby. She will benefit from skilled PT to determine the safest AD, educate patient on safety, and improve her mobility.      Follow Up Recommendations Home health PT    Equipment Recommendations  None recommended by PT    Recommendations for Other Services       Precautions / Restrictions Precautions Precautions: Fall Restrictions Weight Bearing Restrictions: No      Mobility  Bed Mobility Overal bed mobility: Modified Independent             General bed mobility comments:  needs VC for safety  Transfers Overall transfer level: Needs assistance Equipment used: Straight cane Transfers: Sit to/from Stand Sit to Stand: Min assist         General transfer comment: needs cues for safety  Ambulation/Gait Ambulation/Gait assistance: Min assist Gait Distance (Feet): 10 Feet Assistive device: Quad cane Gait Pattern/deviations: Step-to pattern     General Gait Details: (unsteady)  Stairs            Wheelchair Mobility    Modified Rankin (Stroke Patients Only)       Balance Overall balance assessment: Needs assistance Sitting-balance support: Bilateral upper extremity supported;Feet supported       Standing balance support: Bilateral upper extremity supported Standing balance-Leahy Scale: Fair Standing balance comment: (unsteady )                             Pertinent Vitals/Pain Pain Assessment: No/denies pain    Home Living Family/patient expects to be discharged to:: Private residence Living Arrangements: Alone Available Help at Discharge: Family Type of Home: House Home Access: Stairs to enter Entrance Stairs-Rails: Right Entrance Stairs-Number of Steps: (3) Home Layout: One level        Prior Function Level of Independence: Independent with assistive device(s)               Hand Dominance        Extremity/Trunk Assessment   Upper Extremity Assessment Upper Extremity Assessment: Overall WFL for tasks assessed    Lower Extremity Assessment  Lower Extremity Assessment: Generalized weakness;RLE deficits/detail;LLE deficits/detail RLE: (hip 3/5, knee 3/5) LLE: (hip 3/5, knee 3/5)       Communication   Communication: No difficulties  Cognition Arousal/Alertness: Awake/alert Behavior During Therapy: WFL for tasks assessed/performed Overall Cognitive Status: Within Functional Limits for tasks assessed                                        General Comments      Exercises      Assessment/Plan    PT Assessment Patient needs continued PT services  PT Problem List Decreased strength;Decreased balance;Decreased mobility;Cardiopulmonary status limiting activity       PT Treatment Interventions Gait training;Therapeutic activities;Therapeutic exercise;Balance training    PT Goals (Current goals can be found in the Care Plan section)  Acute Rehab PT Goals Patient Stated Goal: (to go home) PT Goal Formulation: Patient unable to participate in goal setting Time For Goal Achievement: 04/04/19 Potential to Achieve Goals: Good    Frequency Min 2X/week   Barriers to discharge        Co-evaluation               AM-PAC PT "6 Clicks" Mobility  Outcome Measure Help needed turning from your back to your side while in a flat bed without using bedrails?: A Little Help needed moving from lying on your back to sitting on the side of a flat bed without using bedrails?: A Little Help needed moving to and from a bed to a chair (including a wheelchair)?: A Little Help needed standing up from a chair using your arms (e.g., wheelchair or bedside chair)?: A Little Help needed to walk in hospital room?: A Lot Help needed climbing 3-5 steps with a railing? : A Lot 6 Click Score: 16    End of Session Equipment Utilized During Treatment: Gait belt Activity Tolerance: Patient tolerated treatment well Patient left: in bed Nurse Communication: Mobility status PT Visit Diagnosis: Unsteadiness on feet (R26.81);Muscle weakness (generalized) (M62.81);History of falling (Z91.81);Difficulty in walking, not elsewhere classified (R26.2)    Time: 1315-1330 PT Time Calculation (min) (ACUTE ONLY): 15 min   Charges:   PT Evaluation $PT Eval Low Complexity: 1 Low PT Treatments $Therapeutic Activity: 8-22 mins         Arelia Sneddon S, PT DPT 03/21/2019, 2:30 PM

## 2019-03-21 NOTE — Consult Note (Signed)
CARDIOLOGY CONSULT NOTE               Patient ID: RANE DUMM MRN: [id number] DOB/AGE: 1931-12-08 86 y.o.  Admit date: 03/21/2019 Referring Physician Dr.Agbata hospitalist Primary Physician Dr. Frazier Richards primary Primary Cardiologist unknown Reason for Consultation CVA atrial/ flutter bradycardia altered mental status  HPI: 86 year old female history of dementia paroxysmal atrial fibrillation atrial flutter on supposedly Eliquis 5 mg twice a day hypertension renal insufficiency thyroid disease dementia brought to emergency room with a change in mental status.  Daughter states that the patient started behaving more confused they were concerned that she may have a urinary tract infection patient underwent CT of the head which showed new hypoattenuation in the left parietal lobe patient then had MRI which suggested left parietal lobe multiple areas of acute ischemia scattered punctate foci within both cerebral hemispheres and right cerebral and multiple vascular territories.  Because the patient dementia she is not able to help with most of the history there is been no history of chest pain no shortness of breath no blackout spells or syncope.  Review of systems complete and found to be negative unless listed above     Past Medical History:  Diagnosis Date  . Cancer [facility])    colon cancer   . Hemorrhoid   . Hypertension   . Thyroid disease     Past Surgical History:  Procedure Laterality Date  . COLON SURGERY    . FRACTURE SURGERY      (Not in a hospital admission)  Social History   Socioeconomic History  . Marital status: Married    Spouse name: Not on file  . Number of children: Not on file  . Years of education: Not on file  . Highest education level: Not on file  Occupational History  . Not on file  Tobacco Use  . Smoking status: Never Smoker  . Smokeless tobacco: Never Used  Substance and Sexual Activity  . Alcohol use: No  . Drug use: No  .  Sexual activity: Never  Other Topics Concern  . Not on file  Social History Narrative  . Not on file   Social Determinants of Health   Financial Resource Strain:   . Difficulty of Paying Living Expenses:   Food Insecurity:   . Worried About Charity fundraiser in the Last Year:   . Arboriculturist in the Last Year:   Transportation Needs:   . Film/video editor (Medical):   Marland Kitchen Lack of Transportation (Non-Medical):   Physical Activity:   . Days of Exercise per Week:   . Minutes of Exercise per Session:   Stress:   . Feeling of Stress :   Social Connections:   . Frequency of Communication with Friends and Family:   . Frequency of Social Gatherings with Friends and Family:   . Attends Religious Services:   . Active Member of Clubs or Organizations:   . Attends Archivist Meetings:   Marland Kitchen Marital Status:   Intimate Partner Violence:   . Fear of Current or Ex-Partner:   . Emotionally Abused:   Marland Kitchen Physically Abused:   . Sexually Abused:     Family History  Problem Relation Age of Onset  . Colon cancer Mother   . Diabetes Father   . Cancer Brother       Review of systems complete and found to be negative unless listed above      PHYSICAL EXAM  General:  Well developed, well nourished, in no acute distress HEENT:  Normocephalic and atramatic Neck:  No JVD.  Lungs: Clear bilaterally to auscultation and percussion. Heart: Irregular irregular bradycardic. Normal S1 and S2 without gallops or murmurs.  Abdomen: Bowel sounds are positive, abdomen soft and non-tender  Msk:  Back normal, normal gait. Normal strength and tone for age. Extremities: No clubbing, cyanosis or edema.   Neuro: Alert and oriented X 3. Psych:  Good affect, responds appropriately  Labs:   Lab Results  Component Value Date   WBC 8.9 03/20/2019   HGB 12.1 03/20/2019   HCT 37.7 03/20/2019   MCV 92.4 03/20/2019   PLT 182 03/20/2019    Recent Labs  Lab 03/20/19 1923  NA 139  K 3.7    CL 101  CO2 30  BUN 27*  CREATININE 1.51*  CALCIUM 9.4  PROT 7.0  BILITOT 0.6  ALKPHOS 71  ALT 17  AST 32  GLUCOSE 110*   Lab Results  Component Value Date   TROPONINI 0.04 01/26/2012   No results found for: CHOL No results found for: HDL No results found for: LDLCALC No results found for: TRIG No results found for: CHOLHDL No results found for: LDLDIRECT    Radiology: CT Head Wo Contrast  Result Date: 03/21/2019 CLINICAL DATA:  Headache EXAM: CT HEAD WITHOUT CONTRAST CT CERVICAL SPINE WITHOUT CONTRAST TECHNIQUE: Multidetector CT imaging of the head and cervical spine was performed following the standard protocol without intravenous contrast. Multiplanar CT image reconstructions of the cervical spine were also generated. COMPARISON:  Head CT 02/20/2017 FINDINGS: CT HEAD FINDINGS Brain: There is no mass, hemorrhage or extra-axial collection. The size and configuration of the ventricles and extra-axial CSF spaces are normal. There is an area of hypoattenuation in the left parietal lobe (sagittal image 33) that is new compared to the prior study but otherwise age-indeterminate. There is periventricular hypoattenuation compatible with chronic microvascular disease. Vascular: No abnormal hyperdensity of the major intracranial arteries or dural venous sinuses. No intracranial atherosclerosis. Skull: The visualized skull base, calvarium and extracranial soft tissues are normal. Sinuses/Orbits: No fluid levels or advanced mucosal thickening of the visualized paranasal sinuses. No mastoid or middle ear effusion. The orbits are normal. CT CERVICAL SPINE FINDINGS Alignment: Reversal of normal cervical lordosis may be positional or due to muscle spasm. Grade 1 anterolisthesis at C4-5. Skull base and vertebrae: Moderate left C4-5 facet hypertrophy. Soft tissues and spinal canal: No prevertebral fluid or swelling. No visible canal hematoma. Disc levels: No advanced spinal canal or neural foraminal  stenosis. Upper chest: No pneumothorax, pulmonary nodule or pleural effusion. Other: Normal visualized paraspinal cervical soft tissues. IMPRESSION: 1. Area of hypoattenuation in the left parietal lobe is new compared to the prior study but otherwise age-indeterminate. MRI is recommended for better characterization as is may indicate an acute or subacute infarct. 2. No intracranial hemorrhage. 3. No acute fracture of the cervical spine. 4. Reversal of normal cervical lordosis may be positional or due to muscle spasm. Electronically Signed   By: Ulyses Jarred M.D.   On: 03/21/2019 04:06   CT Cervical Spine Wo Contrast  Result Date: 03/21/2019 CLINICAL DATA:  Headache EXAM: CT HEAD WITHOUT CONTRAST CT CERVICAL SPINE WITHOUT CONTRAST TECHNIQUE: Multidetector CT imaging of the head and cervical spine was performed following the standard protocol without intravenous contrast. Multiplanar CT image reconstructions of the cervical spine were also generated. COMPARISON:  Head CT 02/20/2017 FINDINGS: CT HEAD FINDINGS Brain: There is no mass, hemorrhage  or extra-axial collection. The size and configuration of the ventricles and extra-axial CSF spaces are normal. There is an area of hypoattenuation in the left parietal lobe (sagittal image 33) that is new compared to the prior study but otherwise age-indeterminate. There is periventricular hypoattenuation compatible with chronic microvascular disease. Vascular: No abnormal hyperdensity of the major intracranial arteries or dural venous sinuses. No intracranial atherosclerosis. Skull: The visualized skull base, calvarium and extracranial soft tissues are normal. Sinuses/Orbits: No fluid levels or advanced mucosal thickening of the visualized paranasal sinuses. No mastoid or middle ear effusion. The orbits are normal. CT CERVICAL SPINE FINDINGS Alignment: Reversal of normal cervical lordosis may be positional or due to muscle spasm. Grade 1 anterolisthesis at C4-5. Skull base  and vertebrae: Moderate left C4-5 facet hypertrophy. Soft tissues and spinal canal: No prevertebral fluid or swelling. No visible canal hematoma. Disc levels: No advanced spinal canal or neural foraminal stenosis. Upper chest: No pneumothorax, pulmonary nodule or pleural effusion. Other: Normal visualized paraspinal cervical soft tissues. IMPRESSION: 1. Area of hypoattenuation in the left parietal lobe is new compared to the prior study but otherwise age-indeterminate. MRI is recommended for better characterization as is may indicate an acute or subacute infarct. 2. No intracranial hemorrhage. 3. No acute fracture of the cervical spine. 4. Reversal of normal cervical lordosis may be positional or due to muscle spasm. Electronically Signed   By: Ulyses Jarred M.D.   On: 03/21/2019 04:06   MR BRAIN WO CONTRAST  Result Date: 03/21/2019 CLINICAL DATA:  Stroke follow-up EXAM: MRI HEAD WITHOUT CONTRAST TECHNIQUE: Multiplanar, multiecho pulse sequences of the brain and surrounding structures were obtained without intravenous contrast. COMPARISON:  None. FINDINGS: Brain: There is abnormal diffusion restriction within the left parietal lobe. There are additional punctate foci of abnormal diffusion restriction scattered within the left frontal lobe, right thalamus, right occipital lobe and right cerebellum. No acute hemorrhage. No midline shift or other mass effect. Multifocal white matter hyperintensity, most commonly due to chronic ischemic microangiopathy. Normal volume of CSF spaces. Numerous chronic microhemorrhages in a predominantly central distribution. Normal midline structures. Vascular: Normal flow voids. Skull and upper cervical spine: Normal marrow signal. Sinuses/Orbits: Negative. Other: None. IMPRESSION: 1. Multifocal acute ischemia. Largest area is in the left parietal lobe. There are scattered punctate foci within both cerebral hemispheres and the right cerebellum involving multiple vascular territories.  2. No acute hemorrhage or mass effect. 3. Numerous chronic microhemorrhages in a predominantly central distribution, consistent with chronic hypertensive angiopathy. Electronically Signed   By: Ulyses Jarred M.D.   On: 03/21/2019 06:42   CT Renal Stone Study  Result Date: 03/21/2019 CLINICAL DATA:  Flank pain EXAM: CT ABDOMEN AND PELVIS WITHOUT CONTRAST TECHNIQUE: Multidetector CT imaging of the abdomen and pelvis was performed following the standard protocol without IV contrast. COMPARISON:  09/14/2015 FINDINGS: Lower chest: Densely calcified coronary arteries, mitral valve, and visualized descending thoracic aorta. No acute abnormality. Hepatobiliary: Prior cholecystectomy.  No focal hepatic abnormality. Pancreas: No focal abnormality or ductal dilatation. Spleen: No focal abnormality.  Normal size. Adrenals/Urinary Tract: Atrophic right kidney, stable. Exophytic cyst off the upper pole of the left kidney measures 4 cm. No ureteral stones or hydronephrosis. Urinary bladder and adrenal glands unremarkable. Stomach/Bowel: Bowel grossly unremarkable. No evidence of bowel obstruction. Evaluation limited with lack of oral and intravenous contrast and respiratory motion. Vascular/Lymphatic: Aortic atherosclerosis. No enlarged abdominal or pelvic lymph nodes. Reproductive: Unremarkable. Other: No free fluid or free air. Musculoskeletal: Numerous severe compression fractures, chronic  and stable at T11 and L4. As well as at L5, new since 2017. IMPRESSION: No renal or ureteral stones.  No hydronephrosis. Atrophic right kidney, stable. Diffuse coronary artery disease.  Heavily calcified aorta. Chronic compression fractures at T11 and L4. Age-indeterminate compression fracture at L5, new since 2017. Electronically Signed   By: Rolm Baptise M.D.   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 Plan Agree with admit to telemetry Consider short-term anticoagulation with heparin Surface echocardiogram would be helpful for evaluation of possible thrombus Consider TEE if surface echo was unremarkable Agree with antibiotic therapy for urinary tract infection Consider neurology input for evaluation of multifocal CVA Recommend aspirin therapy initially Continue blood pressure management and control Recommend high-dose statin therapy PT OT evaluation Evaluate whether the patient was taking her Eliquis adequately  Signed: Yolonda Kida MD, 03/21/2019, 11:23 AM

## 2019-03-21 NOTE — ED Notes (Signed)
Pt assisted to bathroom

## 2019-03-21 NOTE — ED Notes (Signed)
Daughter called and pt given phone to speak with her.  Ate all of lunch. Waiting on bed for admission

## 2019-03-21 NOTE — ED Notes (Signed)
Pt assisted to bathroom.  [place] at bedside.

## 2019-03-21 NOTE — Consult Note (Addendum)
ANTICOAGULATION CONSULT NOTE   Pharmacy Consult for Heparin  Indication: atrial fibrillation and with multifocal infarct  No Known Allergies  Patient Measurements: Height: 5\' 4"  (162.6 cm) Weight: 140 lb (63.5 kg) IBW/kg (Calculated) : 54.7 Heparin Dosing Weight: 63.5 kg   Vital Signs: BP: 175/75 (03/20 1300) Pulse Rate: 52 (03/20 1300)  Labs: Recent Labs    03/20/19 1923  HGB 12.1  HCT 37.7  PLT 182  CREATININE 1.51*  TROPONINIHS 6    Estimated Creatinine Clearance: 23.5 mL/min (A) (by C-G formula based on SCr of 1.51 mg/dL (H)).   Medications:  Eliquis - last dose unknown. Per patient she took the last dose this AM. However, she has been admitted in the ED since ~1900 on 03/20/19.  Assessment: Patient has a PMH for Afib and is on Eliquis who presentswith AMS 2/2 to UTI/stroke. MRI of the brain reviewed and shows acute, small infarcts in multiple vascular territories.  Embolic etiology likely.  Baseline labs need to be ordered. Given has been here for almost 20 hours will bolus heparin. Will base dose on aPTT until HL correlates with aPTT.  Goal of Therapy:  Heparin level 0.3-0.7 units/ml aPTT 66-102 seconds Monitor platelets by anticoagulation protocol: Yes   Plan:  Baseline labs have been ordered  Heparin DW: 63.5 kg  Give 3150 units bolus x 1 Start heparin infusion at 850 units/hr Check anti-Xa level in 8 hours and daily while on heparin, per protocol Continue to monitor H&H and platelets   Lenvil Swaim R Amr [place] 03/21/2019,3:22 PM

## 2019-03-21 NOTE — ED Notes (Signed)
Updated daughter ms riddick.

## 2019-03-21 NOTE — ED Notes (Signed)
Updated daughter on room and transfer to floor.

## 2019-03-22 ENCOUNTER — Inpatient Hospital Stay: Admit: 2019-03-22 | Payer: Medicare Other

## 2019-03-22 DIAGNOSIS — E039 Hypothyroidism, unspecified: Secondary | ICD-10-CM

## 2019-03-22 DIAGNOSIS — R4182 Altered mental status, unspecified: Secondary | ICD-10-CM

## 2019-03-22 DIAGNOSIS — I639 Cerebral infarction, unspecified: Secondary | ICD-10-CM

## 2019-03-22 DIAGNOSIS — N3000 Acute cystitis without hematuria: Secondary | ICD-10-CM

## 2019-03-22 LAB — APTT
aPTT: 126 seconds — ABNORMAL HIGH (ref 24–36)
aPTT: 59 seconds — ABNORMAL HIGH (ref 24–36)
aPTT: 85 seconds — ABNORMAL HIGH (ref 24–36)
aPTT: 88 seconds — ABNORMAL HIGH (ref 24–36)

## 2019-03-22 LAB — LIPID PANEL
Cholesterol: 140 mg/dL (ref 0–200)
HDL: 67 mg/dL (ref >40)
LDL Cholesterol: 63 mg/dL (ref 0–99)
Total CHOL/HDL Ratio: 2.1 RATIO
Triglycerides: 52 mg/dL (ref <150)
VLDL: 10 mg/dL (ref 0–40)

## 2019-03-22 LAB — HEMOGLOBIN A1C
Hgb A1c MFr Bld: 6.1 % — ABNORMAL HIGH (ref 4.8–5.6)
Mean Plasma Glucose: 128.37 mg/dL

## 2019-03-22 MED ORDER — HEPARIN BOLUS VIA INFUSION
950.0000 [IU] | Freq: Once | INTRAVENOUS | Status: AC
  Administered 2019-03-22: 950 [IU] via INTRAVENOUS
  Filled 2019-03-22: qty 950

## 2019-03-22 MED ORDER — AMLODIPINE BESYLATE 5 MG PO TABS
5.0000 mg | ORAL_TABLET | Freq: Every day | ORAL | Status: DC
  Administered 2019-03-22 – 2019-03-23 (×2): 5 mg via ORAL
  Filled 2019-03-22 (×2): qty 1

## 2019-03-22 MED ORDER — LABETALOL HCL 5 MG/ML IV SOLN
10.0000 mg | INTRAVENOUS | Status: DC | PRN
  Administered 2019-03-22 (×2): 10 mg via INTRAVENOUS
  Filled 2019-03-22 (×2): qty 4

## 2019-03-22 MED ORDER — HYDRALAZINE HCL 20 MG/ML IJ SOLN
10.0000 mg | Freq: Four times a day (QID) | INTRAMUSCULAR | Status: DC | PRN
  Administered 2019-03-23: 10 mg via INTRAVENOUS
  Filled 2019-03-22 (×2): qty 1

## 2019-03-22 NOTE — Evaluation (Signed)
Occupational Therapy Evaluation Patient Details Name: SHAQUISHA WYNN MRN: [id number] DOB: 07/29/1931 Today's Date: 03/22/2019    History of Present Illness Per MD note:86 year old female history of dementia paroxysmal atrial fibrillation atrial flutter on supposedly Eliquis 5 mg twice a day hypertension renal insufficiency thyroid disease dementia brought to emergency room with a change in mental status.  Daughter states that the patient started behaving more confused they were concerned that she may have a urinary tract infection patient underwent CT of the head which showed new hypoattenuation in the left parietal lobe patient then had MRI which suggested left parietal lobe multiple areas of acute ischemia scattered punctate foci within both cerebral hemispheres and right cerebral and multiple vascular territories.  Because the patient dementia she is not able to help with most of the history there is been no history of chest pain no shortness of breath no blackout spells or syncope.   Clinical Impression   Ms Lowenstein was seen for OT evaluation this date. Prior to hospital admission, pt was independent in all aspects of ADL.  Pt lives with alone in a one-level home. Currently pt demonstrates significant impairments in RUE strength and coordination. Pt is R hand dominant and requires SETUP and SUPERVISION for self-feeding at bed level. OT deferred OOB mobility and ADL assessment 2/2 bed rest orders and pt request to finish breakfast. Pt is eager to return to her PLOF with increased independence and functional use of her R arm and leg. Pt educated on falls prevention, visual scanning strategies, and importance of supervision. Pt has dementia at baseline but demonstrated STM deficits, poor safety awareness, and limited command following. Pt would benefit from skilled OT to address noted impairments and functional limitations (see below for any additional details) in order to maximize safety and  independence while minimizing falls risk and caregiver burden.  Upon hospital discharge, recommend pt discharge with [place] and 24-HR supervision to maximize safety and return to PLOF.    Follow Up Recommendations  Home health OT;Supervision/Assistance - 24 hour    Equipment Recommendations       Recommendations for Other Services       Precautions / Restrictions Precautions Precautions: Fall Restrictions Weight Bearing Restrictions: No      Mobility Bed Mobility               General bed mobility comments: Deferred mobility assessment 2/2 bed rest order  Transfers Overall transfer level: Needs assistance               General transfer comment: Deferred OOB assessment 2/2 bedrest orders, HOB 0-30*    Balance                                           ADL either performed or assessed with clinical judgement   ADL Overall ADL's : Needs assistance/impaired                                       General ADL Comments: SETUP and SUP self-feeding at bed level. Pt unaware of spilling oatmeal on self, places containers in precarious positions at edge of tray, and refuses changing gowns after soiling from meal because "I don't see that I need that." SETUP and SUP hand washing and wiping mouth at bed level  Vision         Perception     Praxis      Pertinent Vitals/Pain Pain Assessment: No/denies pain     Hand Dominance Right   Extremity/Trunk Assessment Upper Extremity Assessment Upper Extremity Assessment: RUE deficits/detail;LUE deficits/detail RUE Deficits / Details: achieves thumb-forefinger opposition, unable to complete sequential 5 finger opposition. Unable to open lids but able to open sugar packages.  RUE Sensation: (Pt denies numbness/tingling) RUE Coordination: decreased fine motor LUE Coordination: decreased fine motor   Lower Extremity Assessment Lower Extremity Assessment: Generalized weakness;Defer to PT  evaluation       Communication Communication Communication: No difficulties   Cognition Arousal/Alertness: Awake/alert Behavior During Therapy: WFL for tasks assessed/performed Overall Cognitive Status: Impaired/Different from baseline Area of Impairment: Memory;Following commands;Safety/judgement                     Memory: Decreased short-term memory Following Commands: Follows one step commands inconsistently Safety/Judgement: Decreased awareness of safety;Decreased awareness of deficits     General Comments: Pt has history of dementia. Pt repeatedly stating "oh am I eating breakfast?" and "Oh were you not here for that?" Pt confused t/o meal setup where her OJ had gone after she drank it and complaining that there was no sugar to put in her oatmeal despite her having put sugar in.    General Comments       Exercises Exercises: Other exercises Other Exercises Other Exercises: Pt educated re: falls prevention, visual scanning strategies, DME recomendations, OT role Other Exercises: Self-feeding and extensive setup for meal and reorientation to meal, hand washing at bed level, face washing at bed level   Shoulder Instructions      Home Living Family/patient expects to be discharged to:: Private residence Living Arrangements: Alone Available Help at Discharge: Family Type of Home: House Home Access: Stairs to enter Technical brewer of Steps: 3 Entrance Stairs-Rails: Right Home Layout: One level     Bathroom Shower/Tub: Occupational psychologist: Handicapped height Bathroom Accessibility: Yes How Accessible: Accessible via walker Home Equipment: [place] - 2 wheels;Cane - single point;Shower seat - built in;Grab bars - toilet;Grab bars - tub/shower;Hand held shower head          Prior Functioning/Environment Level of Independence: Independent with assistive device(s)        Comments: Pt states she lives alone, uses SPC as needed but largely  Independent. Her daughter will deliver groceries as needed.         OT Problem List: Decreased strength;Decreased activity tolerance;Impaired balance (sitting and/or standing);Impaired vision/perception;Decreased coordination;Decreased cognition;Decreased safety awareness;Decreased knowledge of use of DME or AE      OT Treatment/Interventions: Self-care/ADL training;Therapeutic exercise;Neuromuscular education;Energy conservation;DME and/or AE instruction;Therapeutic activities;Cognitive remediation/compensation;Visual/perceptual remediation/compensation;Balance training;Patient/family education    OT Goals(Current goals can be found in the care plan section) Acute Rehab OT Goals Patient Stated Goal: to go home OT Goal Formulation: With patient Time For Goal Achievement: 04/05/19 Potential to Achieve Goals: Fair ADL Goals Pt Will Perform Eating: with modified independence;with caregiver independent in assisting;sitting(c no cues for safety) Pt Will Perform Grooming: with modified independence;with caregiver independent in assisting;sitting(c MIN cues) Pt Will Transfer to Toilet: with min assist;ambulating;regular height toilet(c LRAD PRN)  OT Frequency: Min 2X/week   Barriers to D/C: Decreased caregiver support          Co-evaluation              AM-PAC OT "6 Clicks" Daily Activity  Outcome Measure Help from another person eating meals?: A Little Help from another person taking care of personal grooming?: A Little Help from another person toileting, which includes using toliet, bedpan, or urinal?: A Little Help from another person bathing (including washing, rinsing, drying)?: A Little Help from another person to put on and taking off regular upper body clothing?: A Little Help from another person to put on and taking off regular lower body clothing?: A Little 6 Click Score: 18   End of Session    Activity Tolerance: Patient tolerated treatment well;Other (comment)(Pt  limited by cognition) Patient left: in bed;with call bell/phone within reach;with bed alarm set  OT Visit Diagnosis: Unsteadiness on feet (R26.81);Other abnormalities of gait and mobility (R26.89);Hemiplegia and hemiparesis Hemiplegia - Right/Left: Right Hemiplegia - dominant/non-dominant: Dominant Hemiplegia - caused by: Nontraumatic intracerebral hemorrhage                Time: 8206-0156 OT Time Calculation (min): 25 min Charges:  OT General Charges $OT Visit: 1 Visit OT Evaluation $OT Eval Moderate Complexity: 1 Mod OT Treatments $Self Care/Home Management : 8-22 mins  Dessie Coma, M.S. OTR/L  03/22/19, 10:53 AM

## 2019-03-22 NOTE — Progress Notes (Signed)
PROGRESS NOTE    Haley Brooks  EGB:[id number] DOB: [birth year] DOA: 03/21/2019 PCP: Kirk Ruths, MD    Brief Narrative:  Haley Brooks is a 86 y.o. female with medical history significant for paroxysmal atrial fibrillation on anticoagulation, hypertension, kidney disease stage IV, thyroid disease and dementia who was brought into the emergency room for evaluation of change in mental status.     Consultants:   neurology  Procedures: ct, mri, echo, carotid US  Antimicrobials:      Subjective: Pleasant, has no complaints. Denies sob, cp.   Objective: Vitals:   03/22/19 0228 03/22/19 0320 03/22/19 0404 03/22/19 0701  BP: (!) 188/114   (!) 189/79  Pulse: (!) 112 (!) 109 76 72  Resp:    16  Temp:    98.8 F (37.1 C)  TempSrc:    Oral  SpO2:  95% 91% 92%  Weight:      Height:        Intake/Output Summary (Last 24 hours) at 03/22/2019 0752 Last data filed at 03/22/2019 0747 Gross per 24 hour  Intake 504.25 ml  Output --  Net 504.25 ml   Filed Weights   03/20/19 1920  Weight: 63.5 kg    Examination:  General exam: Appears calm and comfortable, nad  Respiratory system: Clear to auscultation. Respiratory effort normal. Cardiovascular system: S1 & S2 heard, RRR. No JVD, murmurs, rubs, gallops or clicks.  Gastrointestinal system: Abdomen is nondistended, soft and nontender. Normal bowel sounds heard. Central nervous system: Alert and oriented x3, 5 out of 5 motor strength globally grossly intact no facial droop Extremities no edema Skin: Warm dry Psychiatry:  Mood & affect appropriate in current setting.     Data Reviewed: I have personally reviewed following labs and imaging studies  CBC: Recent Labs  Lab 03/20/19 1923 03/21/19 1525  WBC 8.9 8.8  HGB 12.1 11.0*  HCT 37.7 34.6*  MCV 92.4 93.3  PLT 182 371   Basic Metabolic Panel: Recent Labs  Lab 03/20/19 1923  NA 139  K 3.7  CL 101  CO2 30  GLUCOSE 110*  BUN 27*  CREATININE  1.51*  CALCIUM 9.4   GFR: Estimated Creatinine Clearance: 23.5 mL/min (A) (by C-G formula based on SCr of 1.51 mg/dL (H)). Liver Function Tests: Recent Labs  Lab 03/20/19 1923  AST 32  ALT 17  ALKPHOS 71  BILITOT 0.6  PROT 7.0  ALBUMIN 3.4*   No results for input(s): LIPASE, AMYLASE in the last 168 hours. No results for input(s): AMMONIA in the last 168 hours. Coagulation Profile: Recent Labs  Lab 03/21/19 1525  INR 1.3*   Cardiac Enzymes: No results for input(s): CKTOTAL, CKMB, CKMBINDEX, TROPONINI in the last 168 hours. BNP (last 3 results) No results for input(s): PROBNP in the last 8760 hours. HbA1C: No results for input(s): HGBA1C in the last 72 hours. CBG: No results for input(s): GLUCAP in the last 168 hours. Lipid Profile: No results for input(s): CHOL, HDL, LDLCALC, TRIG, CHOLHDL, LDLDIRECT in the last 72 hours. Thyroid Function Tests: No results for input(s): TSH, T4TOTAL, FREET4, T3FREE, THYROIDAB in the last 72 hours. Anemia Panel: No results for input(s): VITAMINB12, FOLATE, FERRITIN, TIBC, IRON, RETICCTPCT in the last 72 hours. Sepsis Labs: No results for input(s): PROCALCITON, LATICACIDVEN in the last 168 hours.  Recent Results (from the past 240 hour(s))  SARS CORONAVIRUS 2 (TAT 6-24 HRS) Nasopharyngeal Nasopharyngeal Swab     Status: None   Collection Time: 03/21/19  6:48  AM   Specimen: Nasopharyngeal Swab  Result Value Ref Range Status   SARS Coronavirus 2 NEGATIVE NEGATIVE Final    Comment: (NOTE) SARS-CoV-2 target nucleic acids are NOT DETECTED. The SARS-CoV-2 RNA is generally detectable in upper and lower respiratory specimens during the acute phase of infection. Negative results do not preclude SARS-CoV-2 infection, do not rule out co-infections with other pathogens, and should not be used as the sole basis for treatment or other patient management decisions. Negative results must be combined with clinical observations, patient history, and  epidemiological information. The expected result is Negative. Fact Sheet for Patients: SugarRoll.be Fact Sheet for Healthcare Providers: https://www.woods-mathews.com/ This test is not yet approved or cleared by the [place] FDA and  has been authorized for detection and/or diagnosis of SARS-CoV-2 by FDA under an Emergency Use Authorization (EUA). This EUA will remain  in effect (meaning this test can be used) for the duration of the COVID-19 declaration under Section 56 4(b)(1) of the Act, 21 U.S.C. section 360bbb-3(b)(1), unless the authorization is terminated or revoked sooner. Performed at [facility], [place] [address], [place], [place] [address]          Radiology Studies: CT Head Wo Contrast  Result Date: 03/21/2019 CLINICAL DATA:  Headache EXAM: CT HEAD WITHOUT CONTRAST CT CERVICAL SPINE WITHOUT CONTRAST TECHNIQUE: Multidetector CT imaging of the head and cervical spine was performed following the standard protocol without intravenous contrast. Multiplanar CT image reconstructions of the cervical spine were also generated. COMPARISON:  Head CT 02/20/2017 FINDINGS: CT HEAD FINDINGS Brain: There is no mass, hemorrhage or extra-axial collection. The size and configuration of the ventricles and extra-axial CSF spaces are normal. There is an area of hypoattenuation in the left parietal lobe (sagittal image 33) that is new compared to the prior study but otherwise age-indeterminate. There is periventricular hypoattenuation compatible with chronic microvascular disease. Vascular: No abnormal hyperdensity of the major intracranial arteries or dural venous sinuses. No intracranial atherosclerosis. Skull: The visualized skull base, calvarium and extracranial soft tissues are normal. Sinuses/Orbits: No fluid levels or advanced mucosal thickening of the visualized paranasal sinuses. No mastoid or middle ear effusion. The orbits are normal. CT  CERVICAL SPINE FINDINGS Alignment: Reversal of normal cervical lordosis may be positional or due to muscle spasm. Grade 1 anterolisthesis at C4-5. Skull base and vertebrae: Moderate left C4-5 facet hypertrophy. Soft tissues and spinal canal: No prevertebral fluid or swelling. No visible canal hematoma. Disc levels: No advanced spinal canal or neural foraminal stenosis. Upper chest: No pneumothorax, pulmonary nodule or pleural effusion. Other: Normal visualized paraspinal cervical soft tissues. IMPRESSION: 1. Area of hypoattenuation in the left parietal lobe is new compared to the prior study but otherwise age-indeterminate. MRI is recommended for better characterization as is may indicate an acute or subacute infarct. 2. No intracranial hemorrhage. 3. No acute fracture of the cervical spine. 4. Reversal of normal cervical lordosis may be positional or due to muscle spasm. Electronically Signed   By: Ulyses Jarred M.D.   On: 03/21/2019 04:06   CT Cervical Spine Wo Contrast  Result Date: 03/21/2019 CLINICAL DATA:  Headache EXAM: CT HEAD WITHOUT CONTRAST CT CERVICAL SPINE WITHOUT CONTRAST TECHNIQUE: Multidetector CT imaging of the head and cervical spine was performed following the standard protocol without intravenous contrast. Multiplanar CT image reconstructions of the cervical spine were also generated. COMPARISON:  Head CT 02/20/2017 FINDINGS: CT HEAD FINDINGS Brain: There is no mass, hemorrhage or extra-axial collection. The size and configuration of  the ventricles and extra-axial CSF spaces are normal. There is an area of hypoattenuation in the left parietal lobe (sagittal image 33) that is new compared to the prior study but otherwise age-indeterminate. There is periventricular hypoattenuation compatible with chronic microvascular disease. Vascular: No abnormal hyperdensity of the major intracranial arteries or dural venous sinuses. No intracranial atherosclerosis. Skull: The visualized skull base,  calvarium and extracranial soft tissues are normal. Sinuses/Orbits: No fluid levels or advanced mucosal thickening of the visualized paranasal sinuses. No mastoid or middle ear effusion. The orbits are normal. CT CERVICAL SPINE FINDINGS Alignment: Reversal of normal cervical lordosis may be positional or due to muscle spasm. Grade 1 anterolisthesis at C4-5. Skull base and vertebrae: Moderate left C4-5 facet hypertrophy. Soft tissues and spinal canal: No prevertebral fluid or swelling. No visible canal hematoma. Disc levels: No advanced spinal canal or neural foraminal stenosis. Upper chest: No pneumothorax, pulmonary nodule or pleural effusion. Other: Normal visualized paraspinal cervical soft tissues. IMPRESSION: 1. Area of hypoattenuation in the left parietal lobe is new compared to the prior study but otherwise age-indeterminate. MRI is recommended for better characterization as is may indicate an acute or subacute infarct. 2. No intracranial hemorrhage. 3. No acute fracture of the cervical spine. 4. Reversal of normal cervical lordosis may be positional or due to muscle spasm. Electronically Signed   By: Ulyses Jarred M.D.   On: 03/21/2019 04:06   MR BRAIN WO CONTRAST  Result Date: 03/21/2019 CLINICAL DATA:  Stroke follow-up EXAM: MRI HEAD WITHOUT CONTRAST TECHNIQUE: Multiplanar, multiecho pulse sequences of the brain and surrounding structures were obtained without intravenous contrast. COMPARISON:  None. FINDINGS: Brain: There is abnormal diffusion restriction within the left parietal lobe. There are additional punctate foci of abnormal diffusion restriction scattered within the left frontal lobe, right thalamus, right occipital lobe and right cerebellum. No acute hemorrhage. No midline shift or other mass effect. Multifocal white matter hyperintensity, most commonly due to chronic ischemic microangiopathy. Normal volume of CSF spaces. Numerous chronic microhemorrhages in a predominantly central  distribution. Normal midline structures. Vascular: Normal flow voids. Skull and upper cervical spine: Normal marrow signal. Sinuses/Orbits: Negative. Other: None. IMPRESSION: 1. Multifocal acute ischemia. Largest area is in the left parietal lobe. There are scattered punctate foci within both cerebral hemispheres and the right cerebellum involving multiple vascular territories. 2. No acute hemorrhage or mass effect. 3. Numerous chronic microhemorrhages in a predominantly central distribution, consistent with chronic hypertensive angiopathy. Electronically Signed   By: Ulyses Jarred M.D.   On: 03/21/2019 06:42   US Carotid Bilateral (at [facility] and [facility] only)  Result Date: 03/21/2019 CLINICAL DATA:  Carotid artery disease. Hypertension, hyperlipidemia. EXAM: BILATERAL CAROTID DUPLEX ULTRASOUND TECHNIQUE: Pearline Cables scale imaging, color Doppler and duplex ultrasound were performed of bilateral carotid and vertebral arteries in the neck. COMPARISON:  01/27/2012 FINDINGS: Criteria: Quantification of carotid stenosis is based on velocity parameters that correlate the residual internal carotid diameter with NASCET-based stenosis levels, using the diameter of the distal internal carotid lumen as the denominator for stenosis measurement. The following velocity measurements were obtained: RIGHT ICA: 101/13 cm/sec CCA: 06/26 cm/sec SYSTOLIC ICA/CCA RATIO:  1.9 ECA: 68 cm/sec LEFT ICA: 65/17 cm/sec CCA: 94/85 cm/sec SYSTOLIC ICA/CCA RATIO:  1.0 ECA: 69 cm/sec RIGHT CAROTID ARTERY: Eccentric calcified plaque in the bulb and ICA origin resulting in short segment stenosis of at least mild severity. No focal aliasing. Normal waveforms and color Doppler signal. RIGHT VERTEBRAL ARTERY: Normal flow direction and waveform. There is a nonspecific  cardiac arrhythmia. LEFT CAROTID ARTERY: Mild calcified plaque in the bulb. No high-grade stenosis. Normal waveforms and color Doppler signal. LEFT VERTEBRAL ARTERY:  Normal flow direction and  waveform. IMPRESSION: 1. Bilateral carotid bifurcation plaque resulting in less than 50% diameter ICA stenosis. 2. Antegrade bilateral vertebral arterial flow. Electronically Signed   By: Lucrezia Europe M.D.   On: 03/21/2019 13:43   CT Renal Stone Study  Result Date: 03/21/2019 CLINICAL DATA:  Flank pain EXAM: CT ABDOMEN AND PELVIS WITHOUT CONTRAST TECHNIQUE: Multidetector CT imaging of the abdomen and pelvis was performed following the standard protocol without IV contrast. COMPARISON:  09/14/2015 FINDINGS: Lower chest: Densely calcified coronary arteries, mitral valve, and visualized descending thoracic aorta. No acute abnormality. Hepatobiliary: Prior cholecystectomy.  No focal hepatic abnormality. Pancreas: No focal abnormality or ductal dilatation. Spleen: No focal abnormality.  Normal size. Adrenals/Urinary Tract: Atrophic right kidney, stable. Exophytic cyst off the upper pole of the left kidney measures 4 cm. No ureteral stones or hydronephrosis. Urinary bladder and adrenal glands unremarkable. Stomach/Bowel: Bowel grossly unremarkable. No evidence of bowel obstruction. Evaluation limited with lack of oral and intravenous contrast and respiratory motion. Vascular/Lymphatic: Aortic atherosclerosis. No enlarged abdominal or pelvic lymph nodes. Reproductive: Unremarkable. Other: No free fluid or free air. Musculoskeletal: Numerous severe compression fractures, chronic and stable at T11 and L4. As well as at L5, new since 2017. IMPRESSION: No renal or ureteral stones.  No hydronephrosis. Atrophic right kidney, stable. Diffuse coronary artery disease.  Heavily calcified aorta. Chronic compression fractures at T11 and L4. Age-indeterminate compression fracture at L5, new since 2017. Electronically Signed   By: Rolm Baptise M.D.   On: 03/21/2019 03:57        Scheduled Meds: . amLODipine  5 mg Oral Daily  . aspirin  325 mg Oral Daily  . atorvastatin  80 mg Oral q1800  . [START ON 03/23/2019] conjugated  estrogens  1 Applicatorful Vaginal Once per day on Mon Thu  . donepezil  10 mg Oral BID  . ferrous sulfate  325 mg Oral BID WC  . levothyroxine  200 mcg Oral QAC breakfast   Continuous Infusions: . cefTRIAXone (ROCEPHIN)  IV    . heparin 600 Units/hr (03/22/19 0747)    Assessment & Plan:   Principal Problem:   CVA (cerebral vascular accident) ([place]) Active Problems:   Acquired hypothyroidism   Essential hypertension   UTI (urinary tract infection)   Alzheimer's dementia (H[facility])   AF (paroxysmal atrial fibrillation) (H[facility])   Acute CVA (cerebrovascular accident) ([place])   Anemia due to stage 4 chronic kidney disease ([place])   Acute CVA Patient presented for evaluation of mental status changes described as worsening confusion by family.   She has a known history of paroxysmal atrial fibrillation CT/MRI as above- multifocal acute ischemia. Largest area is in the left parietal lobe. Numerous chronic microhemorrhages in a predominantly central distribution, consistent with chronic hypertensive angiopathy. (see full report) OT/PT-home OT/PT Neurology following- acute, small infarcts in multiple vascular territories.  Embolic etiology likely. Patient on aspirin and heparin drip Eliquis held Echo pending-cardiology following.  If echo negative for thrombus will need TEE Risk factor modification, started on statins     Urinary tract infection Patient with significant pyuria Treating  empirically with Rocephin 1 g IV daily  Culture grew multiple organisms needs recollection    History of paroxysmal atrial fibrillation Patient is supposed to be on Eliquis as secondary prophylaxis for an acute CVA and has been complaint Hold Eliquis for  now, on heparin drip    Chronic kidney disease stage IV Stable   Hypertension Allow for permissive hypertension due to acute CVA Started on IV hydralazine as needed and amlodipine 5 mg daily  Hypothyroidism Continue Synthroid   DVT  prophylaxis: Heparin Code Status: Full Family Communication: None at bedside Disposition Plan: back home with home health Barriers: Work-up for acute stroke still pending, including Echo, TEE, bp management.       LOS: 1 day   Time spent: 45 minutes with more than 50% COC    Nolberto Hanlon, MD [facility] Pager [phone]  If 7PM-7AM, please contact night-coverage [email or web address] Password [facility] 03/22/2019, 7:52 AM

## 2019-03-22 NOTE — Consult Note (Signed)
ANTICOAGULATION CONSULT NOTE   Pharmacy Consult for Heparin  Indication: atrial fibrillation and with multifocal infarct  No Known Allergies  Patient Measurements: Height: 5\' 4"  (162.6 cm) Weight: 140 lb (63.5 kg) IBW/kg (Calculated) : 54.7 Heparin Dosing Weight: 63.5 kg   Vital Signs: Temp: 97.2 F (36.2 C) (03/21 1602) Temp Source: Oral (03/21 1602) BP: 161/72 (03/21 1602) Pulse Rate: 52 (03/21 1602)  Labs: Recent Labs    03/20/19 1923 03/21/19 1525 03/21/19 1525 03/21/19 1540 03/22/19 0025 03/22/19 0747 03/22/19 1550  HGB 12.1 11.0*  --   --   --   --   --   HCT 37.7 34.6*  --   --   --   --   --   PLT 182 163  --   --   --   --   --   APTT  --  28   < >  --  126* 85* 59*  LABPROT  --  16.0*  --   --   --   --   --   INR  --  1.3*  --   --   --   --   --   HEPARINUNFRC  --   --   --  2.00*  --   --   --   CREATININE 1.51*  --   --   --   --   --   --   TROPONINIHS 6  --   --   --   --   --   --    < > = values in this interval not displayed.    Estimated Creatinine Clearance: 23.5 mL/min (A) (by C-G formula based on SCr of 1.51 mg/dL (H)).   Medications:  Eliquis - last dose unknown. Per patient she took the last dose this AM. However, she has been admitted in the ED since ~1900 on 03/20/19.  Assessment: Patient has a PMH for Afib and is on Eliquis who presents with AMS 2/2 to UTI/stroke. MRI of the brain reviewed and shows acute, small infarcts in multiple vascular territories.  Embolic etiology likely.  Baseline labs need to be ordered. Given has been here for almost 20 hours will bolus heparin. Will base dose on aPTT until HL correlates with aPTT.  Goal of Therapy:  Heparin level 0.3-0.7 units/ml aPTT 66-102 seconds Monitor platelets by anticoagulation protocol: Yes   Plan:  3/21 1609  aPTT 59 subtherapeutic.Confirmed no heparin interruptions and level was drawn from opposite arm. Will order heparin bolus 950 units x1, then will increase heparin drip to  700 units/hr- nursing aware of changes. Repeat aPTT in 6 hours. CBC and HL daily.   Rowland Lathe, PharmD 03/22/2019,5:09 PM

## 2019-03-22 NOTE — Consult Note (Signed)
ANTICOAGULATION CONSULT NOTE   Pharmacy Consult for Heparin  Indication: atrial fibrillation and with multifocal infarct  No Known Allergies  Patient Measurements: Height: 5\' 4"  (162.6 cm) Weight: 140 lb (63.5 kg) IBW/kg (Calculated) : 54.7 Heparin Dosing Weight: 63.5 kg   Vital Signs: Temp: 97.8 F (36.6 C) (03/21 0051) Temp Source: Oral (03/21 0051) BP: 183/95 (03/21 0051) Pulse Rate: 98 (03/21 0051)  Labs: Recent Labs    03/20/19 1923 03/21/19 1525 03/21/19 1540 03/22/19 0025  HGB 12.1 11.0*  --   --   HCT 37.7 34.6*  --   --   PLT 182 163  --   --   APTT  --  28  --  126*  LABPROT  --  16.0*  --   --   INR  --  1.3*  --   --   HEPARINUNFRC  --   --  2.00*  --   CREATININE 1.51*  --   --   --   TROPONINIHS 6  --   --   --     Estimated Creatinine Clearance: 23.5 mL/min (A) (by C-G formula based on SCr of 1.51 mg/dL (H)).   Medications:  Eliquis - last dose unknown. Per patient she took the last dose this AM. However, she has been admitted in the ED since ~1900 on 03/20/19.  Assessment: Patient has a PMH for Afib and is on Eliquis who presentswith AMS 2/2 to UTI/stroke. MRI of the brain reviewed and shows acute, small infarcts in multiple vascular territories.  Embolic etiology likely.  Baseline labs need to be ordered. Given has been here for almost 20 hours will bolus heparin. Will base dose on aPTT until HL correlates with aPTT.  Goal of Therapy:  Heparin level 0.3-0.7 units/ml aPTT 66-102 seconds Monitor platelets by anticoagulation protocol: Yes   Plan:  Baseline labs have been ordered  Heparin DW: 63.5 kg  Give 3150 units bolus x 1 Start heparin infusion at 850 units/hr Check anti-Xa level in 8 hours and daily while on heparin, per protocol Continue to monitor H&H and platelets  3/21 0025 aPTT 126, SUPRAtherapeutic.  Will decrease Heparin to 600 units/hr and recheck aPTT in ~ 8 hours   Hart Robinsons A 03/22/2019,1:26 AM

## 2019-03-22 NOTE — Consult Note (Addendum)
ANTICOAGULATION CONSULT NOTE   Pharmacy Consult for Heparin  Indication: atrial fibrillation and with multifocal infarct  No Known Allergies  Patient Measurements: Height: 5\' 4"  (162.6 cm) Weight: 140 lb (63.5 kg) IBW/kg (Calculated) : 54.7 Heparin Dosing Weight: 63.5 kg   Vital Signs: Temp: 98.8 F (37.1 C) (03/21 0701) Temp Source: Oral (03/21 0701) BP: 167/75 (03/21 0900) Pulse Rate: 72 (03/21 0701)  Labs: Recent Labs    03/20/19 1923 03/21/19 1525 03/21/19 1540 03/22/19 0025 03/22/19 0747  HGB 12.1 11.0*  --   --   --   HCT 37.7 34.6*  --   --   --   PLT 182 163  --   --   --   APTT  --  28  --  126* 85*  LABPROT  --  16.0*  --   --   --   INR  --  1.3*  --   --   --   HEPARINUNFRC  --   --  2.00*  --   --   CREATININE 1.51*  --   --   --   --   TROPONINIHS 6  --   --   --   --     Estimated Creatinine Clearance: 23.5 mL/min (A) (by C-G formula based on SCr of 1.51 mg/dL (H)).   Medications:  Eliquis - last dose unknown. Per patient she took the last dose this AM. However, she has been admitted in the ED since ~1900 on 03/20/19.  Assessment: Patient has a PMH for Afib and is on Eliquis who presents with AMS 2/2 to UTI/stroke. MRI of the brain reviewed and shows acute, small infarcts in multiple vascular territories.  Embolic etiology likely.  Baseline labs need to be ordered. Given has been here for almost 20 hours will bolus heparin. Will base dose on aPTT until HL correlates with aPTT.  Goal of Therapy:  Heparin level 0.3-0.7 units/ml aPTT 66-102 seconds Monitor platelets by anticoagulation protocol: Yes   Plan:  3/21 0747 APTT 85 therapeutic x 1. Will continue heparin drip at 600 units/hr. Repeat APTT at 1600 to confirm. CBC and HL daily.   Tawnya Crook, PharmD 03/22/2019,10:52 AM

## 2019-03-22 NOTE — TOC Initial Note (Addendum)
Transition of Care [facility]) - Initial/Assessment Note    Patient Details  Name: Haley Brooks MRN: [id number] Date of Birth: June 24, 1931  Transition of Care [facility]) CM/SW Contact:    Boris Sharper, LCSW Phone Number:[phone] 03/22/2019, 10:04 AM  Clinical Narrative:                 CSW contacted pt's daughter to discuss PT recommendations. Mateo Flow gave permission for CSW to make arrangements for [facility] PT. Daughter said that she had [place] in the past but does not remember who it was with. CSW spoke with Corene Cornea at [facility], he said that he was going to make sure they could take the pt and let me know.   11amCorene Cornea with [facility] can follow the pt upon discharge  TOC will continue to follow for discharge planning needs.   Expected Discharge Plan: [place] Barriers to Discharge: Continued Medical Work up   Patient Goals and CMS Choice Patient states their goals for this hospitalization and ongoing recovery are:: ro go home and gain strength CMS Medicare.gov Compare Post Acute Care list provided to:: Patient Represenative (must comment) Choice offered to / list presented to : Adult Children  Expected Discharge Plan and Services Expected Discharge Plan: [place] Choice: [place] arrangements for the past 2 months: [address]: PT [place] Agency: [place] ([place]) Date [place]: 03/22/19 Time [place]: 1000 Representative spoke with at [place]: [place] Arrangements/Services Living arrangements for the past 2 months: [place] with:: Self Patient language and need for interpreter reviewed:: Yes Do you feel safe going back to the place where you live?: Yes      Need for Family Participation in Patient Care: Yes (Comment) Care giver support system in place?: Yes (comment)(Daughter)   Criminal  Activity/Legal Involvement Pertinent to Current Situation/Hospitalization: No - Comment as needed  Activities of Daily Living Home Assistive Devices/Equipment: Environmental consultant (specify type), Other (Comment)(walk in tub) ADL Screening (condition at time of admission) Patient's cognitive ability adequate to safely complete daily activities?: Yes Is the patient deaf or have difficulty hearing?: No Does the patient have difficulty seeing, even when wearing glasses/contacts?: No Does the patient have difficulty concentrating, remembering, or making decisions?: Yes Patient able to express need for assistance with ADLs?: Yes Does the patient have difficulty dressing or bathing?: Yes Independently performs ADLs?: No Communication: Needs assistance Is this a change from baseline?: Change from baseline, expected to last >3 days Dressing (OT): Needs assistance Is this a change from baseline?: Change from baseline, expected to last >3 days Grooming: Needs assistance Is this a change from baseline?: Change from baseline, expected to last >3 days Feeding: Independent Bathing: Needs assistance Is this a change from baseline?: Change from baseline, expected to last >3 days Toileting: Needs assistance Is this a change from baseline?: Change from baseline, expected to last >3days In/Out Bed: Independent with device (comment) Walks in Home: Independent with device (comment) Does the patient have difficulty walking or climbing stairs?: Yes Weakness of Legs: None Weakness of Arms/Hands: None  Permission Sought/Granted Permission sought to share information with : Facility Art therapist granted to share information with : Yes, Verbal Permission Granted  Share  Information with NAME: Mateo Flow     Permission granted to share info w Relationship: daughter  Permission granted to share info w Contact Information: [phone]  Emotional Assessment Appearance:: Other (Comment Required(unable to  assess) Attitude/Demeanor/Rapport: Unable to Assess Affect (typically observed): Unable to Assess Orientation: : Oriented to Self   Psych Involvement: No (comment)  Admission diagnosis:  Acute cystitis without hematuria [N30.00] AKI (acute kidney injury) ([place]) [N17.9] Carotid arterial disease (HCC) [I77.9] Acute CVA (cerebrovascular accident) ([place]) [I63.9] Altered mental status, unspecified altered mental status type [R41.82] Patient Active Problem List   Diagnosis Date Noted  . CVA (cerebral vascular accident) ([place]) 03/21/2019  . AF (paroxysmal atrial fibrillation) ([place]) 03/21/2019  . Acute CVA (cerebrovascular accident) ([place]) 03/21/2019  . Anemia due to stage 4 chronic kidney disease ([place]) 03/21/2019  . Acute delirium 02/21/2017  . Alzheimer's dementia ([place]) 02/21/2017  . Acute encephalopathy 02/18/2017  . Acute metabolic encephalopathy 70/35/0093  . UTI (urinary tract infection) 12/24/2016  . Hemorrhoid   . History of colon cancer 04/05/2015  . Chronic anemia 03/10/2015  . Osteopenia 12/11/2014  . SDAT (senile dementia of Alzheimer's type) ([place]) 06/18/2014  . Acquired hypothyroidism 03/01/2014  . Essential hypertension 03/01/2014  . Mixed hyperlipidemia 03/01/2014   PCP:  Kirk Ruths, MD Pharmacy:   [id number] [place], [address] [place] [address] Phone: [phone] Fax: [phone]  [place], [place] [facility] [address] [place] [address] [place] [address] Phone: [phone] Fax: [phone]     Social Determinants of Health ([place]) Interventions    Readmission Risk Interventions No flowsheet data found.

## 2019-03-23 ENCOUNTER — Inpatient Hospital Stay
Admit: 2019-03-23 | Discharge: 2019-03-23 | Disposition: A | Discharge status: Home / Self Care | Payer: Medicare Other | Attending: Internal Medicine | Admitting: Internal Medicine

## 2019-03-23 LAB — CBC
HCT: 33.6 % — ABNORMAL LOW (ref 36.0–46.0)
Hemoglobin: 10.9 g/dL — ABNORMAL LOW (ref 12.0–15.0)
MCH: 29.4 pg (ref 26.0–34.0)
MCHC: 32.4 g/dL (ref 30.0–36.0)
MCV: 90.6 fL (ref 80.0–100.0)
Platelets: 158 10*3/uL (ref 150–400)
RBC: 3.71 MIL/uL — ABNORMAL LOW (ref 3.87–5.11)
RDW: 14.8 % (ref 11.5–15.5)
WBC: 7.7 10*3/uL (ref 4.0–10.5)
nRBC: 0 % (ref 0.0–0.2)

## 2019-03-23 LAB — CREATININE, SERUM
Creatinine, Ser: 1.33 mg/dL — ABNORMAL HIGH (ref 0.44–1.00)
GFR calc Af Amer: 42 mL/min — ABNORMAL LOW (ref >60)
GFR calc non Af Amer: 36 mL/min — ABNORMAL LOW (ref >60)

## 2019-03-23 LAB — HEPARIN LEVEL (UNFRACTIONATED): Heparin Unfractionated: 0.66 IU/mL (ref 0.30–0.70)

## 2019-03-23 LAB — ECHOCARDIOGRAM COMPLETE

## 2019-03-23 LAB — APTT: aPTT: 85 seconds — ABNORMAL HIGH (ref 24–36)

## 2019-03-23 MED ORDER — SENNOSIDES-DOCUSATE SODIUM 8.6-50 MG PO TABS
2.0000 | ORAL_TABLET | Freq: Two times a day (BID) | ORAL | Status: DC
  Administered 2019-03-23 – 2019-03-24 (×3): 2 via ORAL
  Filled 2019-03-23 (×3): qty 2

## 2019-03-23 MED ORDER — AMLODIPINE BESYLATE 10 MG PO TABS
10.0000 mg | ORAL_TABLET | Freq: Every day | ORAL | Status: DC
  Administered 2019-03-24: 10 mg via ORAL
  Filled 2019-03-23: qty 1

## 2019-03-23 MED ORDER — BENAZEPRIL HCL 10 MG PO TABS: 10.0000 mg | ORAL_TABLET | Freq: Every day | ORAL | 0 refills | Status: DC

## 2019-03-23 MED ORDER — APIXABAN 5 MG PO TABS
5.0000 mg | ORAL_TABLET | Freq: Two times a day (BID) | ORAL | Status: DC
  Administered 2019-03-23 – 2019-03-24 (×3): 5 mg via ORAL
  Filled 2019-03-23 (×3): qty 1

## 2019-03-23 MED ORDER — BENAZEPRIL HCL 5 MG PO TABS
5.0000 mg | ORAL_TABLET | Freq: Every day | ORAL | Status: DC
  Administered 2019-03-23: 13:00:00 5 mg via ORAL
  Filled 2019-03-23: qty 1

## 2019-03-23 MED ORDER — FLEET ENEMA 7-19 GM/118ML RE ENEM
1.0000 | ENEMA | Freq: Once | RECTAL | Status: AC
  Administered 2019-03-23: 1 via RECTAL

## 2019-03-23 MED ORDER — BISACODYL 10 MG RE SUPP
10.0000 mg | Freq: Once | RECTAL | Status: AC
  Administered 2019-03-23: 10 mg via RECTAL
  Filled 2019-03-23: qty 1

## 2019-03-23 MED ORDER — SENNOSIDES-DOCUSATE SODIUM 8.6-50 MG PO TABS: 2.0000 | ORAL_TABLET | Freq: Every evening | ORAL | 0 refills | Status: DC | PRN

## 2019-03-23 MED ORDER — BENAZEPRIL HCL 10 MG PO TABS
10.0000 mg | ORAL_TABLET | Freq: Every day | ORAL | Status: DC
  Administered 2019-03-24: 10 mg via ORAL
  Filled 2019-03-23: qty 1

## 2019-03-23 MED ORDER — SODIUM CHLORIDE 0.9 % IV SOLN
INTRAVENOUS | Status: DC | PRN
  Administered 2019-03-24: 25 mL via INTRAVENOUS

## 2019-03-23 MED ORDER — ATORVASTATIN CALCIUM 80 MG PO TABS: 80.0000 mg | ORAL_TABLET | Freq: Every day | ORAL | 0 refills | Status: DC

## 2019-03-23 MED ORDER — AMLODIPINE BESYLATE 5 MG PO TABS
5.0000 mg | ORAL_TABLET | Freq: Once | ORAL | Status: AC
  Administered 2019-03-23: 5 mg via ORAL
  Filled 2019-03-23: qty 1

## 2019-03-23 NOTE — Progress Notes (Signed)
Physical Therapy Treatment Patient Details Name: Haley Brooks MRN: [id number] DOB: 07/21/1931 Today's Date: 03/23/2019    History of Present Illness Per MD note:86 year old female history of dementia paroxysmal atrial fibrillation atrial flutter on supposedly Eliquis 5 mg twice a day hypertension renal insufficiency thyroid disease dementia brought to emergency room with a change in mental status.  Daughter states that the patient started behaving more confused they were concerned that she may have a urinary tract infection patient underwent CT of the head which showed new hypoattenuation in the left parietal lobe patient then had MRI which suggested left parietal lobe multiple areas of acute ischemia scattered punctate foci within both cerebral hemispheres and right cerebral and multiple vascular territories.  Because the patient dementia she is not able to help with most of the history there is been no history of chest pain no shortness of breath no blackout spells or syncope.    PT Comments    Pt in bed,  Stated she feels "misserable"  Agrees to session with encouragement.  To EOB with min a x 1 and heavy use of rails.  Asking for help but encouraged pt to do on her own.  Steady in sitting.  Stood to [employer] with min a x 1 and transferred hesitantly to recliner at bedside.  Once sitting she stated she needed to use the bathroom.  Stood and walked 3' with poor safety to commode asking what she was doing and moving quickly.  Pt sat with poor safety on commode and quickly expelled a large amount of gas but only trace BM (RN stated in discussion after session she had an enema about 30 minutes earlier).  She was given an extended amount of time but little to no results and continued c/o abdominal discomfort.  She was assisted back to recliner. She voices fear during transitions regarding concern over falling.  When discussed discharge, she voiced she lives alone but daughter checks daily.  HR did fluctuate  greatly during session generally consistent around 125 but did drop down into 80's on occasion.  Discussed with RN regarding mobility concerns and planned discharge today.  If she goes home she will need +1 assist at all times for mobility and overall safety.  SNF would be appropriate if care cannot be provided at home.  Will increase to 7x per week therapy given CVA diagnosis and overall declining mobility since admission.  Discussed with primary PT.    Follow Up Recommendations  SNF;Other (comment)     Equipment Recommendations  Rolling walker with 5" wheels;3in1 (PT)    Recommendations for Other Services       Precautions / Restrictions Precautions Precautions: Fall Restrictions Weight Bearing Restrictions: No Other Position/Activity Restrictions: Watch HR    Mobility  Bed Mobility Overal bed mobility: Needs Assistance Bed Mobility: Supine to Sit     Supine to sit: Min assist        Transfers Overall transfer level: Needs assistance Equipment used: Rolling walker (2 wheeled) Transfers: Sit to/from Stand Sit to Stand: Min assist         General transfer comment: Requires +1 assist and verbal cues  Ambulation/Gait Ambulation/Gait assistance: Min assist Gait Distance (Feet): 5 Feet Assistive device: Rolling walker (2 wheeled) Gait Pattern/deviations: Step-through pattern;Decreased step length - right;Decreased step length - left;Trunk flexed Gait velocity: decreased   General Gait Details: generally hesitant and fearful   Chief Strategy Officer  Modified Rankin (Stroke Patients Only)       Balance Overall balance assessment: Needs assistance Sitting-balance support: Feet supported Sitting balance-Leahy Scale: Good     Standing balance support: Bilateral upper extremity supported Standing balance-Leahy Scale: Fair                              Cognition Arousal/Alertness: Awake/alert Behavior During Therapy:  WFL for tasks assessed/performed Overall Cognitive Status: Within Functional Limits for tasks assessed                                        Exercises Other Exercises Other Exercises: to commode in attempts at [facility]    General Comments        Pertinent Vitals/Pain Pain Assessment: 0-10 Faces Pain Scale: Hurts even more Pain Location: stomach - constipation Pain Descriptors / Indicators: Aching;Cramping Pain Intervention(s): Limited activity within patient's tolerance;Monitored during session    Home Living                      Prior Function            PT Goals (current goals can now be found in the care plan section) Progress towards PT goals: Progressing toward goals    Frequency    7X/week      PT Plan Discharge plan needs to be updated;Other (comment);Frequency needs to be updated    Co-evaluation              AM-PAC PT "6 Clicks" Mobility   Outcome Measure  Help needed turning from your back to your side while in a flat bed without using bedrails?: A Little Help needed moving from lying on your back to sitting on the side of a flat bed without using bedrails?: A Little Help needed moving to and from a bed to a chair (including a wheelchair)?: A Little Help needed standing up from a chair using your arms (e.g., wheelchair or bedside chair)?: A Little Help needed to walk in hospital room?: A Little Help needed climbing 3-5 steps with a railing? : A Lot 6 Click Score: 17    End of Session Equipment Utilized During Treatment: Gait belt Activity Tolerance: Patient limited by fatigue;Other (comment) Patient left: in chair;with call bell/phone within reach;with chair alarm set Nurse Communication: Mobility status;Other (comment)       Time: 3810-1751 PT Time Calculation (min) (ACUTE ONLY): 30 min  Charges:  $Gait Training: 8-22 mins $Therapeutic Activity: 8-22 mins                    Chesley Noon, PTA 03/23/19, 4:44  PM

## 2019-03-23 NOTE — Discharge Summary (Addendum)
ANAIJA Brooks IHW:[id number] DOB: [birth year] DOA: 03/21/2019  PCP: Kirk Ruths, MD  Admit date: 03/21/2019 Discharge date: 03/24/19  Admitted From: Home Disposition: SNF  Recommendations for Outpatient Follow-up:  1. Follow up with PCP in 1 week 2. Please obtain BMP/CBC in one week 3. Follow up with cardiology in 1 week,Dr [facility]     Discharge Condition:Stable CODE STATUS:Full Diet recommendation: Heart Healthy Brief/Interim Summary: Haley Brooks is a 86 y.o. female with medical history significant for paroxysmal atrial fibrillation on anticoagulation, hypertension, kidney disease stage IV, thyroid disease and dementia who was brought into the emergency room for evaluation of change in mental status.  Her daughter who provided most of the history stated that she appeared more confused than normal.  She has had mental status changes in the past related to a UTI and so she wanted her to get checked to make sure she did not have a urinary tract infection.  Patient also had a fall at home with complaints of back pain.  Patient had a CT scan of the head which showed an area of hypoattenuation in the left parietal lobe is new compared to the prior study but otherwise age-indeterminate. Had MRI brain with results. Below. She was admitted to the hospital. Neurology was consulted.She was initially started on heparin gtt then switched back to Eliquis. Initially allowed permissive hypertension , then started reinstituting her blood pressure medications.  Physical therapy and Occupational Therapy were consulted they recommended home health.  Speech therapy also was consulted.  Neurology recommended prophylactic therapy continuing anticoagulation and aspirin.  Continuing statin.  They had no further neurologic intervention at this time.  She did have an echocardiogram, she had EF of 45 to 50%, with LV having mild decrease systolic function and LVH is severe with grade 2 diastolic  dysfunction.  She will need to be set up for cardiac evaluation as outpatient.  She also had carotid ultrasounds that had less than 50% stenosis.She was also empirically treated for UTI, but urine culture revealed multiple organism , suggesting recollection. Her blood pressure is much better controlled.  I discussed with the patient's daughter who verbalizes an understanding about following up with cardiology as outpatient for evaluation of her echo findings and also with primary care for blood pressure evaluation.  Speech saw the patient and recommended regular/mechanical soft diet, general aspiration precautions and thin liquids.  No follow-ups recommended.  She will go to SNF. Discharge Diagnoses:  Principal Problem:   CVA (cerebral vascular accident) ([place]) Active Problems:   Acquired hypothyroidism   Essential hypertension   UTI (urinary tract infection)   Alzheimer's dementia ([place])   AF (paroxysmal atrial fibrillation) ([facility])   Acute CVA (cerebrovascular accident) ([place])   Anemia due to stage 4 chronic kidney disease ([place])   Altered mental status    Discharge Instructions  Discharge Instructions    Call MD for:  difficulty breathing, headache or visual disturbances   Complete by: As directed    Call MD for:  persistant dizziness or light-headedness   Complete by: As directed    Call MD for:  temperature >100.4   Complete by: As directed    Diet - low sodium heart healthy   Complete by: As directed    Discharge instructions   Complete by: As directed    Follow up with pcp in one week F/u with cardiology in one week. Dr. Nehemiah Massed   Increase activity slowly   Complete by: As directed  Allergies as of 03/24/2019   No Known Allergies     Medication List    STOP taking these medications   amLODipine 10 MG tablet Commonly known as: NORVASC   metoprolol succinate 25 MG 24 hr tablet Commonly known as: TOPROL-XL   Premarin vaginal cream Generic drug: conjugated  estrogens     TAKE these medications   alendronate 70 MG tablet Commonly known as: FOSAMAX Take 1 tablet by mouth once a week.   aspirin 325 MG EC tablet Take 325 mg by mouth daily.   atorvastatin 80 MG tablet Commonly known as: LIPITOR Take 1 tablet (80 mg total) by mouth daily at 6 PM. What changed:   medication strength  how much to take  when to take this   benazepril 10 MG tablet Commonly known as: LOTENSIN Take 1 tablet (10 mg total) by mouth daily. What changed:   medication strength  how much to take   donepezil 10 MG tablet Commonly known as: ARICEPT Take 1 tablet by mouth 2 (two) times daily.   Eliquis 5 MG Tabs tablet Generic drug: apixaban Take 5 mg by mouth 2 (two) times daily.   escitalopram 10 MG tablet Commonly known as: LEXAPRO Take 10 mg by mouth daily.   ferrous sulfate 325 (65 FE) MG tablet Take 325 mg by mouth 2 (two) times daily with a meal.   levothyroxine 150 MCG tablet Commonly known as: SYNTHROID Take 1 tablet by mouth daily in the afternoon.   memantine 10 MG tablet Commonly known as: NAMENDA Take 10 mg by mouth 2 (two) times daily.   senna-docusate 8.6-50 MG tablet Commonly known as: Senokot-S Take 2 tablets by mouth at bedtime as needed for mild constipation or moderate constipation.   senna-docusate 8.6-50 MG tablet Commonly known as: Senokot-S Take 2 tablets by mouth 2 (two) times daily.       Contact information for follow-up providers    Kirk Ruths, MD. Go on 03/25/2019.   Specialty: Internal Medicine Why: @ 11:15am Contact information: [address] [place] [address] [phone]        Corey Skains, MD. Go on 03/27/2019.   Specialty: Cardiology Why: @ 2:15p Contact information: [address] [facility] [place] [address] [phone]            Contact information for after-discharge care    Destination     [facility] Preferred [facility] .   Service: Skilled Nursing Contact information: [address] [place] [phone]                 No Known Allergies  Consultations:  Neurology   Procedures/Studies: CT Head Wo Contrast  Result Date: 03/21/2019 CLINICAL DATA:  Headache EXAM: CT HEAD WITHOUT CONTRAST CT CERVICAL SPINE WITHOUT CONTRAST TECHNIQUE: Multidetector CT imaging of the head and cervical spine was performed following the standard protocol without intravenous contrast. Multiplanar CT image reconstructions of the cervical spine were also generated. COMPARISON:  Head CT 02/20/2017 FINDINGS: CT HEAD FINDINGS Brain: There is no mass, hemorrhage or extra-axial collection. The size and configuration of the ventricles and extra-axial CSF spaces are normal. There is an area of hypoattenuation in the left parietal lobe (sagittal image 33) that is new compared to the prior study but otherwise age-indeterminate. There is periventricular hypoattenuation compatible with chronic microvascular disease. Vascular: No abnormal hyperdensity of the major intracranial arteries or dural venous sinuses. No intracranial  atherosclerosis. Skull: The visualized skull base, calvarium and extracranial soft tissues are normal. Sinuses/Orbits: No fluid levels or advanced mucosal thickening of the visualized paranasal sinuses. No mastoid or middle ear effusion. The orbits are normal. CT CERVICAL SPINE FINDINGS Alignment: Reversal of normal cervical lordosis may be positional or due to muscle spasm. Grade 1 anterolisthesis at C4-5. Skull base and vertebrae: Moderate left C4-5 facet hypertrophy. Soft tissues and spinal canal: No prevertebral fluid or swelling. No visible canal hematoma. Disc levels: No advanced spinal canal or neural foraminal stenosis. Upper chest: No pneumothorax, pulmonary nodule or pleural effusion. Other: Normal visualized paraspinal cervical soft tissues.  IMPRESSION: 1. Area of hypoattenuation in the left parietal lobe is new compared to the prior study but otherwise age-indeterminate. MRI is recommended for better characterization as is may indicate an acute or subacute infarct. 2. No intracranial hemorrhage. 3. No acute fracture of the cervical spine. 4. Reversal of normal cervical lordosis may be positional or due to muscle spasm. Electronically Signed   By: Ulyses Jarred M.D.   On: 03/21/2019 04:06   CT Cervical Spine Wo Contrast  Result Date: 03/21/2019 CLINICAL DATA:  Headache EXAM: CT HEAD WITHOUT CONTRAST CT CERVICAL SPINE WITHOUT CONTRAST TECHNIQUE: Multidetector CT imaging of the head and cervical spine was performed following the standard protocol without intravenous contrast. Multiplanar CT image reconstructions of the cervical spine were also generated. COMPARISON:  Head CT 02/20/2017 FINDINGS: CT HEAD FINDINGS Brain: There is no mass, hemorrhage or extra-axial collection. The size and configuration of the ventricles and extra-axial CSF spaces are normal. There is an area of hypoattenuation in the left parietal lobe (sagittal image 33) that is new compared to the prior study but otherwise age-indeterminate. There is periventricular hypoattenuation compatible with chronic microvascular disease. Vascular: No abnormal hyperdensity of the major intracranial arteries or dural venous sinuses. No intracranial atherosclerosis. Skull: The visualized skull base, calvarium and extracranial soft tissues are normal. Sinuses/Orbits: No fluid levels or advanced mucosal thickening of the visualized paranasal sinuses. No mastoid or middle ear effusion. The orbits are normal. CT CERVICAL SPINE FINDINGS Alignment: Reversal of normal cervical lordosis may be positional or due to muscle spasm. Grade 1 anterolisthesis at C4-5. Skull base and vertebrae: Moderate left C4-5 facet hypertrophy. Soft tissues and spinal canal: No prevertebral fluid or swelling. No visible canal  hematoma. Disc levels: No advanced spinal canal or neural foraminal stenosis. Upper chest: No pneumothorax, pulmonary nodule or pleural effusion. Other: Normal visualized paraspinal cervical soft tissues. IMPRESSION: 1. Area of hypoattenuation in the left parietal lobe is new compared to the prior study but otherwise age-indeterminate. MRI is recommended for better characterization as is may indicate an acute or subacute infarct. 2. No intracranial hemorrhage. 3. No acute fracture of the cervical spine. 4. Reversal of normal cervical lordosis may be positional or due to muscle spasm. Electronically Signed   By: Ulyses Jarred M.D.   On: 03/21/2019 04:06   MR BRAIN WO CONTRAST  Result Date: 03/21/2019 CLINICAL DATA:  Stroke follow-up EXAM: MRI HEAD WITHOUT CONTRAST TECHNIQUE: Multiplanar, multiecho pulse sequences of the brain and surrounding structures were obtained without intravenous contrast. COMPARISON:  None. FINDINGS: Brain: There is abnormal diffusion restriction within the left parietal lobe. There are additional punctate foci of abnormal diffusion restriction scattered within the left frontal lobe, right thalamus, right occipital lobe and right cerebellum. No acute hemorrhage. No midline shift or other mass effect. Multifocal white matter hyperintensity, most commonly due to chronic ischemic microangiopathy. Normal volume  of CSF spaces. Numerous chronic microhemorrhages in a predominantly central distribution. Normal midline structures. Vascular: Normal flow voids. Skull and upper cervical spine: Normal marrow signal. Sinuses/Orbits: Negative. Other: None. IMPRESSION: 1. Multifocal acute ischemia. Largest area is in the left parietal lobe. There are scattered punctate foci within both cerebral hemispheres and the right cerebellum involving multiple vascular territories. 2. No acute hemorrhage or mass effect. 3. Numerous chronic microhemorrhages in a predominantly central distribution, consistent with  chronic hypertensive angiopathy. Electronically Signed   By: Ulyses Jarred M.D.   On: 03/21/2019 06:42   US Carotid Bilateral (at [facility] and [facility] only)  Result Date: 03/21/2019 CLINICAL DATA:  Carotid artery disease. Hypertension, hyperlipidemia. EXAM: BILATERAL CAROTID DUPLEX ULTRASOUND TECHNIQUE: Pearline Cables scale imaging, color Doppler and duplex ultrasound were performed of bilateral carotid and vertebral arteries in the neck. COMPARISON:  01/27/2012 FINDINGS: Criteria: Quantification of carotid stenosis is based on velocity parameters that correlate the residual internal carotid diameter with NASCET-based stenosis levels, using the diameter of the distal internal carotid lumen as the denominator for stenosis measurement. The following velocity measurements were obtained: RIGHT ICA: 101/13 cm/sec CCA: 19/62 cm/sec SYSTOLIC ICA/CCA RATIO:  1.9 ECA: 68 cm/sec LEFT ICA: 65/17 cm/sec CCA: 22/97 cm/sec SYSTOLIC ICA/CCA RATIO:  1.0 ECA: 69 cm/sec RIGHT CAROTID ARTERY: Eccentric calcified plaque in the bulb and ICA origin resulting in short segment stenosis of at least mild severity. No focal aliasing. Normal waveforms and color Doppler signal. RIGHT VERTEBRAL ARTERY: Normal flow direction and waveform. There is a nonspecific cardiac arrhythmia. LEFT CAROTID ARTERY: Mild calcified plaque in the bulb. No high-grade stenosis. Normal waveforms and color Doppler signal. LEFT VERTEBRAL ARTERY:  Normal flow direction and waveform. IMPRESSION: 1. Bilateral carotid bifurcation plaque resulting in less than 50% diameter ICA stenosis. 2. Antegrade bilateral vertebral arterial flow. Electronically Signed   By: Lucrezia Europe M.D.   On: 03/21/2019 13:43   ECHOCARDIOGRAM COMPLETE  Result Date: 03/23/2019    ECHOCARDIOGRAM REPORT   Patient Name:   MARRION FINAN Date of Exam: 03/23/2019 Medical Rec #:  [id number]         Height:       64.0 in Accession #:    [id number]        Weight:       140.0 lb Date of Birth:  January 13, 1931        BSA:           1.681 m Patient Age:    36 years          BP:           163/103 mmHg Patient Gender: F                 HR:           58 bpm. Exam Location:  [facility] Procedure: 2D Echo, Cardiac Doppler and Color Doppler Indications:     Stroke 434.91  History:         Patient has no prior history of Echocardiogram examinations.                  Risk Factors:Hypertension.  Sonographer:     Sherrie Sport RDCS (AE) Referring Phys:  [place] Diagnosing Phys: Serafina Royals MD IMPRESSIONS  1. Left ventricular ejection fraction, by estimation, is 45 to 50%. The left ventricle has mildly decreased function. The left ventricle demonstrates global hypokinesis. There is severe left ventricular hypertrophy. Left ventricular diastolic parameters  are consistent with Grade II  diastolic dysfunction (pseudonormalization).  2. Right ventricular systolic function is normal. The right ventricular size is normal. There is normal pulmonary artery systolic pressure.  3. Left atrial size was severely dilated.  4. The mitral valve is degenerative. Moderate mitral valve regurgitation.  5. The aortic valve is normal in structure. Aortic valve regurgitation is mild. FINDINGS  Left Ventricle: Left ventricular ejection fraction, by estimation, is 45 to 50%. The left ventricle has mildly decreased function. The left ventricle demonstrates global hypokinesis. The left ventricular internal cavity size was small. There is severe left ventricular hypertrophy. Left ventricular diastolic parameters are consistent with Grade II diastolic dysfunction (pseudonormalization). Right Ventricle: The right ventricular size is normal. No increase in right ventricular wall thickness. Right ventricular systolic function is normal. There is normal pulmonary artery systolic pressure. The tricuspid regurgitant velocity is 1.43 m/s, and  with an assumed right atrial pressure of 10 mmHg, the estimated right ventricular systolic pressure is 15.1 mmHg. Left Atrium: Left  atrial size was severely dilated. Right Atrium: Right atrial size was normal in size. Pericardium: There is no evidence of pericardial effusion. Mitral Valve: The mitral valve is degenerative in appearance. Moderate mitral valve regurgitation. MV peak gradient, 11.3 mmHg. The mean mitral valve gradient is 5.0 mmHg. Tricuspid Valve: The tricuspid valve is normal in structure. Tricuspid valve regurgitation is mild. Aortic Valve: The aortic valve is normal in structure. Aortic valve regurgitation is mild. Aortic regurgitation PHT measures 802 msec. Aortic valve mean gradient measures 3.0 mmHg. Aortic valve peak gradient measures 6.2 mmHg. Aortic valve area, by VTI measures 2.35 cm. Pulmonic Valve: The pulmonic valve was normal in structure. Pulmonic valve regurgitation is not visualized. Aorta: The aortic root and ascending aorta are structurally normal, with no evidence of dilitation. IAS/Shunts: No atrial level shunt detected by color flow Doppler.  LEFT VENTRICLE PLAX 2D LVIDd:         3.02 cm  Diastology LVIDs:         1.97 cm  LV e' lateral:   4.13 cm/s LV PW:         1.23 cm  LV E/e' lateral: 35.8 LV IVS:        1.63 cm  LV e' medial:    4.35 cm/s LVOT diam:     2.00 cm  LV E/e' medial:  34.0 LV SV:         62 LV SV Index:   37 LVOT Area:     3.14 cm  RIGHT VENTRICLE RV Basal diam:  2.58 cm RV S prime:     6.09 cm/s TAPSE (M-mode): 2.6 cm LEFT ATRIUM              Index       RIGHT ATRIUM           Index LA diam:        5.70 cm  3.39 cm/m  RA Area:     14.80 cm LA Vol (A2C):   147.0 ml 87.44 ml/m RA Volume:   40.10 ml  23.85 ml/m LA Vol (A4C):   127.0 ml 75.54 ml/m LA Biplane Vol: 138.0 ml 82.08 ml/m  AORTIC VALVE                   PULMONIC VALVE AV Area (Vmax):    2.23 cm    PV Vmax:        0.83 m/s AV Area (Vmean):   2.78 cm    PV Peak grad:  2.8 mmHg AV Area (VTI):     2.35 cm    RVOT Peak grad: 5 mmHg AV Vmax:           124.00 cm/s AV Vmean:          75.150 cm/s AV VTI:            0.264 m AV Peak  Grad:      6.2 mmHg AV Mean Grad:      3.0 mmHg LVOT Vmax:         87.90 cm/s LVOT Vmean:        66.400 cm/s LVOT VTI:          0.198 m LVOT/AV VTI ratio: 0.75 AI PHT:            802 msec  AORTA Ao Root diam: 2.80 cm MITRAL VALVE                TRICUSPID VALVE MV Area (PHT): 2.82 cm     TR Peak grad:   8.2 mmHg MV Peak grad:  11.3 mmHg    TR Vmax:        143.00 cm/s MV Mean grad:  5.0 mmHg MV Vmax:       1.68 m/s     SHUNTS MV Vmean:      93.3 cm/s    Systemic VTI:  0.20 m MV Decel Time: 269 msec     Systemic Diam: 2.00 cm MV E velocity: 148.00 cm/s MV A velocity: 67.90 cm/s MV E/A ratio:  2.18 Serafina Royals MD Electronically signed by Serafina Royals MD Signature Date/Time: 03/23/2019/12:28:08 PM    Final    CT Renal Stone Study  Result Date: 03/21/2019 CLINICAL DATA:  Flank pain EXAM: CT ABDOMEN AND PELVIS WITHOUT CONTRAST TECHNIQUE: Multidetector CT imaging of the abdomen and pelvis was performed following the standard protocol without IV contrast. COMPARISON:  09/14/2015 FINDINGS: Lower chest: Densely calcified coronary arteries, mitral valve, and visualized descending thoracic aorta. No acute abnormality. Hepatobiliary: Prior cholecystectomy.  No focal hepatic abnormality. Pancreas: No focal abnormality or ductal dilatation. Spleen: No focal abnormality.  Normal size. Adrenals/Urinary Tract: Atrophic right kidney, stable. Exophytic cyst off the upper pole of the left kidney measures 4 cm. No ureteral stones or hydronephrosis. Urinary bladder and adrenal glands unremarkable. Stomach/Bowel: Bowel grossly unremarkable. No evidence of bowel obstruction. Evaluation limited with lack of oral and intravenous contrast and respiratory motion. Vascular/Lymphatic: Aortic atherosclerosis. No enlarged abdominal or pelvic lymph nodes. Reproductive: Unremarkable. Other: No free fluid or free air. Musculoskeletal: Numerous severe compression fractures, chronic and stable at T11 and L4. As well as at L5, new since 2017.  IMPRESSION: No renal or ureteral stones.  No hydronephrosis. Atrophic right kidney, stable. Diffuse coronary artery disease.  Heavily calcified aorta. Chronic compression fractures at T11 and L4. Age-indeterminate compression fracture at L5, new since 2017. Electronically Signed   By: Rolm Baptise M.D.   On: 03/21/2019 03:57      Subjective: Patient without any complaints today.  Discharge Exam: Vitals:   03/24/19 0817 03/24/19 1126  BP: (!) 164/48 (!) 158/55  Pulse: 85   Resp: 20   Temp: 97.8 F (36.6 C)   SpO2: 99%    Vitals:   03/24/19 0011 03/24/19 0013 03/24/19 0817 03/24/19 1126  BP: (!) 143/70  (!) 164/48 (!) 158/55  Pulse: (!) 46 (!) 50 85   Resp: 18  20   Temp: 98.2 F (36.8 C)  97.8 F (36.6 C)  TempSrc: Axillary  Oral   SpO2: 95% 96% 99%   Weight:      Height:        General: Pt is alert, awake, not in acute distress Cardiovascular: RRR, S1/S2 +, no rubs, no gallops Respiratory: CTA bilaterally, no wheezing, no rhonchi Abdominal: Soft, NT, ND, bowel sounds + Extremities: no edema, no cyanosis    The results of significant diagnostics from this hospitalization (including imaging, microbiology, ancillary and laboratory) are listed below for reference.     Microbiology: Recent Results (from the past 240 hour(s))  SARS CORONAVIRUS 2 (TAT 6-24 HRS) Nasopharyngeal Nasopharyngeal Swab     Status: None   Collection Time: 03/21/19  6:48 AM   Specimen: Nasopharyngeal Swab  Result Value Ref Range Status   SARS Coronavirus 2 NEGATIVE NEGATIVE Final    Comment: (NOTE) SARS-CoV-2 target nucleic acids are NOT DETECTED. The SARS-CoV-2 RNA is generally detectable in upper and lower respiratory specimens during the acute phase of infection. Negative results do not preclude SARS-CoV-2 infection, do not rule out co-infections with other pathogens, and should not be used as the sole basis for treatment or other patient management decisions. Negative results must be  combined with clinical observations, patient history, and epidemiological information. The expected result is Negative. Fact Sheet for Patients: SugarRoll.be Fact Sheet for Healthcare Providers: https://www.woods-mathews.com/ This test is not yet approved or cleared by the [place] FDA and  has been authorized for detection and/or diagnosis of SARS-CoV-2 by FDA under an Emergency Use Authorization (EUA). This EUA will remain  in effect (meaning this test can be used) for the duration of the COVID-19 declaration under Section 56 4(b)(1) of the Act, 21 U.S.C. section 360bbb-3(b)(1), unless the authorization is terminated or revoked sooner. Performed at [facility], [place] [address], [place], [place] [address]      Labs: BNP (last 3 results) No results for input(s): BNP in the last 8760 hours. Basic Metabolic Panel: Recent Labs  Lab 03/20/19 1923 03/23/19 0624  NA 139  --   K 3.7  --   CL 101  --   CO2 30  --   GLUCOSE 110*  --   BUN 27*  --   CREATININE 1.51* 1.33*  CALCIUM 9.4  --    Liver Function Tests: Recent Labs  Lab 03/20/19 1923  AST 32  ALT 17  ALKPHOS 71  BILITOT 0.6  PROT 7.0  ALBUMIN 3.4*   No results for input(s): LIPASE, AMYLASE in the last 168 hours. No results for input(s): AMMONIA in the last 168 hours. CBC: Recent Labs  Lab 03/20/19 1923 03/21/19 1525 03/23/19 0625  WBC 8.9 8.8 7.7  HGB 12.1 11.0* 10.9*  HCT 37.7 34.6* 33.6*  MCV 92.4 93.3 90.6  PLT 182 163 158   Cardiac Enzymes: No results for input(s): CKTOTAL, CKMB, CKMBINDEX, TROPONINI in the last 168 hours. BNP: Invalid input(s): POCBNP CBG: No results for input(s): GLUCAP in the last 168 hours. D-Dimer No results for input(s): DDIMER in the last 72 hours. Hgb A1c Recent Labs    03/22/19 0747  HGBA1C 6.1*   Lipid Profile Recent Labs    03/22/19 0747  CHOL 140  HDL 67  LDLCALC 63  TRIG 52  CHOLHDL 2.1   Thyroid  function studies No results for input(s): TSH, T4TOTAL, T3FREE, THYROIDAB in the last 72 hours.  Invalid input(s): FREET3 Anemia work up No results for input(s): VITAMINB12, FOLATE, FERRITIN, TIBC, IRON, RETICCTPCT in the last 61  hours. Urinalysis    Component Value Date/Time   COLORURINE YELLOW (A) 03/20/2019 1923   APPEARANCEUR CLOUDY (A) 03/20/2019 1923   APPEARANCEUR Hazy 01/27/2012 0502   LABSPEC 1.019 03/20/2019 1923   LABSPEC 1.013 01/27/2012 0502   PHURINE 6.0 03/20/2019 1923   GLUCOSEU 50 (A) 03/20/2019 1923   GLUCOSEU Negative 01/27/2012 0502   HGBUR MODERATE (A) 03/20/2019 1923   BILIRUBINUR NEGATIVE 03/20/2019 1923   BILIRUBINUR Negative 01/27/2012 0502   KETONESUR NEGATIVE 03/20/2019 1923   PROTEINUR >=300 (A) 03/20/2019 1923   NITRITE NEGATIVE 03/20/2019 1923   LEUKOCYTESUR LARGE (A) 03/20/2019 1923   LEUKOCYTESUR 3+ 01/27/2012 0502   Sepsis Labs Invalid input(s): PROCALCITONIN,  WBC,  LACTICIDVEN Microbiology Recent Results (from the past 240 hour(s))  SARS CORONAVIRUS 2 (TAT 6-24 HRS) Nasopharyngeal Nasopharyngeal Swab     Status: None   Collection Time: 03/21/19  6:48 AM   Specimen: Nasopharyngeal Swab  Result Value Ref Range Status   SARS Coronavirus 2 NEGATIVE NEGATIVE Final    Comment: (NOTE) SARS-CoV-2 target nucleic acids are NOT DETECTED. The SARS-CoV-2 RNA is generally detectable in upper and lower respiratory specimens during the acute phase of infection. Negative results do not preclude SARS-CoV-2 infection, do not rule out co-infections with other pathogens, and should not be used as the sole basis for treatment or other patient management decisions. Negative results must be combined with clinical observations, patient history, and epidemiological information. The expected result is Negative. Fact Sheet for Patients: SugarRoll.be Fact Sheet for Healthcare Providers: https://www.woods-mathews.com/ This  test is not yet approved or cleared by the [place] FDA and  has been authorized for detection and/or diagnosis of SARS-CoV-2 by FDA under an Emergency Use Authorization (EUA). This EUA will remain  in effect (meaning this test can be used) for the duration of the COVID-19 declaration under Section 56 4(b)(1) of the Act, 21 U.S.C. section 360bbb-3(b)(1), unless the authorization is terminated or revoked sooner. Performed at [facility], [place] [address], [place], [place] [address]      Time coordinating discharge: Over 30 minutes  SIGNED:   Nolberto Hanlon, MD  [facility] 03/24/2019, 1:22 PM Pager   If 7PM-7AM, please contact night-coverage [email or web address] Password [email or web address]

## 2019-03-23 NOTE — Progress Notes (Signed)
[facility] Cardiology  Patient Description: Haley Brooks is an 86 year old African-American female with PMH significant for paroxysmal atrial fibrillation on anticoagulation with Eliquis, hypertension, kidney disease stage IV, thyroid disease and dementia who was admitted on 03/21/2019 for acute CVA.  Cardiology was consulted due to atrial fibrillation/flutter with bradycardia and CVA with altered mental status.  SUBJECTIVE: The patient reports to be feeling well today and is without any complaints. The patient denies any chest pain, dyspnea, palpitations, unilateral weakness, syncope or dizziness at this time.  OBJECTIVE: The patient appears to be fairly well but continues to be a poor historian of health and only AOx2-3 at times. The patient was resting comfortably in bed.  Continuous cardiac monitor reveals paroxysmal atrial fibrillation.   Vitals:   03/23/19 1005 03/23/19 1135 03/23/19 1316 03/23/19 1500  BP: (!) 195/87 (!) 140/51 (!) 149/62 (!) 166/50  Pulse:      Resp:  19 (!) 22 18  Temp:    98.3 F (36.8 C)  TempSrc:    Oral  SpO2:    98%  Weight:      Height:         Intake/Output Summary (Last 24 hours) at 03/23/2019 1654 Last data filed at 03/23/2019 1420 Gross per 24 hour  Intake 503.31 ml  Output --  Net 503.31 ml      PHYSICAL EXAM  General: Well developed, moderately nourished, in no acute distress HEENT:  Normocephalic and atramatic Neck:  No JVD symmetrical  Lungs: Clear bilaterally to auscultation Heart: irregular heart rate and rhythm. Normal S1 and S2 without gallops or murmurs.  Abdomen: Bowel sounds are positive, abdomen soft and non-tender  Msk: UTA Extremities: +1 BLE edema, No clubbing or cyanosis .   Neuro: Alert and oriented X 2-3 Psych:  Good affect, responds appropriately at times    LABS: Basic Metabolic Panel: Recent Labs    03/20/19 1923 03/23/19 0624  NA 139  --   K 3.7  --   CL 101  --   CO2 30  --   GLUCOSE 110*  --   BUN 27*  --    CREATININE 1.51* 1.33*  CALCIUM 9.4  --    Liver Function Tests: Recent Labs    03/20/19 1923  AST 32  ALT 17  ALKPHOS 71  BILITOT 0.6  PROT 7.0  ALBUMIN 3.4*   No results for input(s): LIPASE, AMYLASE in the last 72 hours. CBC: Recent Labs    03/21/19 1525 03/23/19 0625  WBC 8.8 7.7  HGB 11.0* 10.9*  HCT 34.6* 33.6*  MCV 93.3 90.6  PLT 163 158   Cardiac Enzymes: No results for input(s): CKTOTAL, CKMB, CKMBINDEX, TROPONINI in the last 72 hours. BNP: Invalid input(s): POCBNP D-Dimer: No results for input(s): DDIMER in the last 72 hours. Hemoglobin A1C: Recent Labs    03/22/19 0747  HGBA1C 6.1*   Fasting Lipid Panel: Recent Labs    03/22/19 0747  CHOL 140  HDL 67  LDLCALC 63  TRIG 52  CHOLHDL 2.1   Thyroid Function Tests: No results for input(s): TSH, T4TOTAL, T3FREE, THYROIDAB in the last 72 hours.  Invalid input(s): FREET3 Anemia Panel: No results for input(s): VITAMINB12, FOLATE, FERRITIN, TIBC, IRON, RETICCTPCT in the last 72 hours.  ECHOCARDIOGRAM COMPLETE  Result Date: 03/23/2019    ECHOCARDIOGRAM REPORT   Patient Name:   Haley Brooks Date of Exam: 03/23/2019 Medical Rec #:  [id number]         Height:  64.0 in Accession #:    [id number]        Weight:       140.0 lb Date of Birth:  Jan 12, 1931        BSA:          1.681 m Patient Age:    39 years          BP:           163/103 mmHg Patient Gender: F                 HR:           58 bpm. Exam Location:  [facility] Procedure: 2D Echo, Cardiac Doppler and Color Doppler Indications:     Stroke 434.91  History:         Patient has no prior history of Echocardiogram examinations.                  Risk Factors:Hypertension.  Sonographer:     Sherrie Sport RDCS (AE) Referring Phys:  [place] Diagnosing Phys: Serafina Royals MD IMPRESSIONS  1. Left ventricular ejection fraction, by estimation, is 45 to 50%. The left ventricle has mildly decreased function. The left ventricle demonstrates global  hypokinesis. There is severe left ventricular hypertrophy. Left ventricular diastolic parameters  are consistent with Grade II diastolic dysfunction (pseudonormalization).  2. Right ventricular systolic function is normal. The right ventricular size is normal. There is normal pulmonary artery systolic pressure.  3. Left atrial size was severely dilated.  4. The mitral valve is degenerative. Moderate mitral valve regurgitation.  5. The aortic valve is normal in structure. Aortic valve regurgitation is mild. FINDINGS  Left Ventricle: Left ventricular ejection fraction, by estimation, is 45 to 50%. The left ventricle has mildly decreased function. The left ventricle demonstrates global hypokinesis. The left ventricular internal cavity size was small. There is severe left ventricular hypertrophy. Left ventricular diastolic parameters are consistent with Grade II diastolic dysfunction (pseudonormalization). Right Ventricle: The right ventricular size is normal. No increase in right ventricular wall thickness. Right ventricular systolic function is normal. There is normal pulmonary artery systolic pressure. The tricuspid regurgitant velocity is 1.43 m/s, and  with an assumed right atrial pressure of 10 mmHg, the estimated right ventricular systolic pressure is 63.8 mmHg. Left Atrium: Left atrial size was severely dilated. Right Atrium: Right atrial size was normal in size. Pericardium: There is no evidence of pericardial effusion. Mitral Valve: The mitral valve is degenerative in appearance. Moderate mitral valve regurgitation. MV peak gradient, 11.3 mmHg. The mean mitral valve gradient is 5.0 mmHg. Tricuspid Valve: The tricuspid valve is normal in structure. Tricuspid valve regurgitation is mild. Aortic Valve: The aortic valve is normal in structure. Aortic valve regurgitation is mild. Aortic regurgitation PHT measures 802 msec. Aortic valve mean gradient measures 3.0 mmHg. Aortic valve peak gradient measures 6.2 mmHg.  Aortic valve area, by VTI measures 2.35 cm. Pulmonic Valve: The pulmonic valve was normal in structure. Pulmonic valve regurgitation is not visualized. Aorta: The aortic root and ascending aorta are structurally normal, with no evidence of dilitation. IAS/Shunts: No atrial level shunt detected by color flow Doppler.  LEFT VENTRICLE PLAX 2D LVIDd:         3.02 cm  Diastology LVIDs:         1.97 cm  LV e' lateral:   4.13 cm/s LV PW:         1.23 cm  LV E/e' lateral: 35.8 LV IVS:  1.63 cm  LV e' medial:    4.35 cm/s LVOT diam:     2.00 cm  LV E/e' medial:  34.0 LV SV:         62 LV SV Index:   37 LVOT Area:     3.14 cm  RIGHT VENTRICLE RV Basal diam:  2.58 cm RV S prime:     6.09 cm/s TAPSE (M-mode): 2.6 cm LEFT ATRIUM              Index       RIGHT ATRIUM           Index LA diam:        5.70 cm  3.39 cm/m  RA Area:     14.80 cm LA Vol (A2C):   147.0 ml 87.44 ml/m RA Volume:   40.10 ml  23.85 ml/m LA Vol (A4C):   127.0 ml 75.54 ml/m LA Biplane Vol: 138.0 ml 82.08 ml/m  AORTIC VALVE                   PULMONIC VALVE AV Area (Vmax):    2.23 cm    PV Vmax:        0.83 m/s AV Area (Vmean):   2.78 cm    PV Peak grad:   2.8 mmHg AV Area (VTI):     2.35 cm    RVOT Peak grad: 5 mmHg AV Vmax:           124.00 cm/s AV Vmean:          75.150 cm/s AV VTI:            0.264 m AV Peak Grad:      6.2 mmHg AV Mean Grad:      3.0 mmHg LVOT Vmax:         87.90 cm/s LVOT Vmean:        66.400 cm/s LVOT VTI:          0.198 m LVOT/AV VTI ratio: 0.75 AI PHT:            802 msec  AORTA Ao Root diam: 2.80 cm MITRAL VALVE                TRICUSPID VALVE MV Area (PHT): 2.82 cm     TR Peak grad:   8.2 mmHg MV Peak grad:  11.3 mmHg    TR Vmax:        143.00 cm/s MV Mean grad:  5.0 mmHg MV Vmax:       1.68 m/s     SHUNTS MV Vmean:      93.3 cm/s    Systemic VTI:  0.20 m MV Decel Time: 269 msec     Systemic Diam: 2.00 cm MV E velocity: 148.00 cm/s MV A velocity: 67.90 cm/s MV E/A ratio:  2.18 Serafina Royals MD Electronically signed by  Serafina Royals MD Signature Date/Time: 03/23/2019/12:28:08 PM    Final      TELEMETRY: Paroxysmal atrial fibrillation with a heart rate of 79 bpm  ASSESSMENT AND PLAN:  Principal Problem:   CVA (cerebral vascular accident) ([place]) Active Problems:   Acquired hypothyroidism   Essential hypertension   UTI (urinary tract infection)   Alzheimer's dementia (HCC)   AF (paroxysmal atrial fibrillation) (HCC)   Acute CVA (cerebrovascular accident) ([place])   Anemia due to stage 4 chronic kidney disease (HCC)   Altered mental status    PLAN Paroxysmal atrial fibrillation, rate controlled, reasonably stable  -Agree  with continuous cardiac monitoring  -Recommend discontinuing heparin and transitioning to 5 mg of Eliquis by mouth twice daily.  -Please ensure that patient is taking her Eliquis accurately at home  Echocardiogram reveals mildly decreased LV function with an EF of 45 to 50%, possible thrombus not noted on echo  -Consider TEE for further evaluation of possible thrombus  -Consider diuretics if BLE edema worsens  UTI, reasonably stable  -Agree with antibiotic therapy for urinary tract infection  CVA, reasonably stable   -Agree with neurology plan of care and input appreciated  -Continue aspirin therapy along with Eliquis  -Recommend PT/OT evaluation  Hypertension, not-well controlled  -Continue amlodipine 10 mg daily  -Recommend increasing benazepril to 10 mg daily  Hyperlipidemia, reasonably stable  -Continue high-dose statin therapy with Lipitor  Patient is clear for discharge from a cardiology standpoint.  Please have the patient follow-up with Cardiology as an outpatient with Dr. Georgia Dom in 1 week post discharge.  [place], ACNPC-AG  03/23/2019 4:54 PM

## 2019-03-23 NOTE — Consult Note (Signed)
ANTICOAGULATION CONSULT NOTE   Pharmacy Consult for Heparin  Indication: atrial fibrillation and with multifocal infarct  No Known Allergies  Patient Measurements: Height: 5\' 4"  (162.6 cm) Weight: 140 lb (63.5 kg) IBW/kg (Calculated) : 54.7 Heparin Dosing Weight: 63.5 kg   Vital Signs: Temp: 98.8 F (37.1 C) (03/21 2339) Temp Source: Axillary (03/21 2339) BP: 177/97 (03/22 0652) Pulse Rate: 58 (03/21 2339)  Labs: Recent Labs    03/20/19 1923 03/20/19 1923 03/21/19 1525 03/21/19 1540 03/22/19 0025 03/22/19 1550 03/22/19 2335 03/23/19 0625 03/23/19 0659  HGB 12.1   < > 11.0*  --   --   --   --  10.9*  --   HCT 37.7  --  34.6*  --   --   --   --  33.6*  --   PLT 182  --  163  --   --   --   --  158  --   APTT  --   --  28  --    < > 59* 88*  --  85*  LABPROT  --   --  16.0*  --   --   --   --   --   --   INR  --   --  1.3*  --   --   --   --   --   --   HEPARINUNFRC  --   --   --  2.00*  --   --   --  0.66  --   CREATININE 1.51*  --   --   --   --   --   --   --   --   TROPONINIHS 6  --   --   --   --   --   --   --   --    < > = values in this interval not displayed.    Estimated Creatinine Clearance: 23.5 mL/min (A) (by C-G formula based on SCr of 1.51 mg/dL (H)).   Medications:  Eliquis - last dose unknown. Per patient she took the last dose this AM. However, she has been admitted in the ED since ~1900 on 03/20/19.  Assessment: Patient has a PMH for Afib and is on Eliquis who presents with AMS 2/2 to UTI/stroke. MRI of the brain reviewed and shows acute, small infarcts in multiple vascular territories.  Embolic etiology likely.  Baseline labs need to be ordered. Given has been here for almost 20 hours will bolus heparin. Will base dose on aPTT until HL correlates with aPTT.  3/21 2335 aPTT 88 therapeutic x 1. Continue heparin drip at 700 units/hr  Goal of Therapy:  Heparin level 0.3-0.7 units/ml aPTT 66-102 seconds Monitor platelets by anticoagulation  protocol: Yes   Plan:  3/22@0659  aPTT 85, HL 0.66. aPTT therapeutic x 2 and HL is now correlating. Continue with heparin drip at 700 units/hr. Will transition to HL dosing. Hgb trending down slightly but still relatively stable. Will recheck HL tomorrow with AM labs.  CBC daily.   Pearla Dubonnet, PharmD 03/23/2019,7:45 AM

## 2019-03-23 NOTE — Progress Notes (Signed)
Subjective: No new neurological complaints.    Objective: Current vital signs: BP (!) 140/51   Pulse (!) 58   Temp 98.5 F (36.9 C)   Resp 19   Ht 5\' 4"  (1.626 m)   Wt 63.5 kg   SpO2 97%   BMI 24.03 kg/m  Vital signs in last 24 hours: Temp:  [97.2 F (36.2 C)-98.8 F (37.1 C)] 98.5 F (36.9 C) (03/22 0840) Pulse Rate:  [52-58] 58 (03/22 0840) Resp:  [16-19] 19 (03/22 1135) BP: (140-195)/(51-103) 140/51 (03/22 1135) SpO2:  [94 %-97 %] 97 % (03/22 0840)  Intake/Output from previous day: 03/21 0701 - 03/22 0700 In: 262.9 [I.V.:162.9; IV Piggyback:100] Out: -  Intake/Output this shift: Total I/O In: 159.2 [I.V.:59.2; IV Piggyback:100] Out: -  Nutritional status:  Diet Order            Diet 2 gram sodium Room service appropriate? Yes with Assist; Fluid consistency: Thin  Diet effective now              Neurologic Exam: Mental Status: Alert, disoriented.  Speech fluent without evidence of aphasia.  Able to follow simple commands without difficulty.  Requires reinforcement for 3-step commands Cranial Nerves: II: Discs flat bilaterally; Visual fields grossly normal, pupils equal, round, reactive to light and accommodation III,IV, VI: ptosis not present, extra-ocular motions intact bilaterally V,VII: right facial droop, facial light touch sensation normal bilaterally VIII: hearing normal bilaterally IX,X: gag reflex present XI: bilateral shoulder shrug XII: midline tongue extension Motor: 5/5 throughout Sensory: Pinprick and light touch intact throughout, bilaterally   Lab Results: Basic Metabolic Panel: Recent Labs  Lab 03/20/19 1923  NA 139  K 3.7  CL 101  CO2 30  GLUCOSE 110*  BUN 27*  CREATININE 1.51*  CALCIUM 9.4    Liver Function Tests: Recent Labs  Lab 03/20/19 1923  AST 32  ALT 17  ALKPHOS 71  BILITOT 0.6  PROT 7.0  ALBUMIN 3.4*   No results for input(s): LIPASE, AMYLASE in the last 168 hours. No results for input(s): AMMONIA in the  last 168 hours.  CBC: Recent Labs  Lab 03/20/19 1923 03/21/19 1525 03/23/19 0625  WBC 8.9 8.8 7.7  HGB 12.1 11.0* 10.9*  HCT 37.7 34.6* 33.6*  MCV 92.4 93.3 90.6  PLT 182 163 158    Cardiac Enzymes: No results for input(s): CKTOTAL, CKMB, CKMBINDEX, TROPONINI in the last 168 hours.  Lipid Panel: Recent Labs  Lab 03/22/19 0747  CHOL 140  TRIG 52  HDL 67  CHOLHDL 2.1  VLDL 10  LDLCALC 63    CBG: No results for input(s): GLUCAP in the last 168 hours.  Microbiology: Results for orders placed or performed during the hospital encounter of 03/21/19  SARS CORONAVIRUS 2 (TAT 6-24 HRS) Nasopharyngeal Nasopharyngeal Swab     Status: None   Collection Time: 03/21/19  6:48 AM   Specimen: Nasopharyngeal Swab  Result Value Ref Range Status   SARS Coronavirus 2 NEGATIVE NEGATIVE Final    Comment: (NOTE) SARS-CoV-2 target nucleic acids are NOT DETECTED. The SARS-CoV-2 RNA is generally detectable in upper and lower respiratory specimens during the acute phase of infection. Negative results do not preclude SARS-CoV-2 infection, do not rule out co-infections with other pathogens, and should not be used as the sole basis for treatment or other patient management decisions. Negative results must be combined with clinical observations, patient history, and epidemiological information. The expected result is Negative. Fact Sheet for Patients: SugarRoll.be Fact Sheet for  Healthcare Providers: https://www.woods-mathews.com/ This test is not yet approved or cleared by the [place] and  has been authorized for detection and/or diagnosis of SARS-CoV-2 by FDA under an Emergency Use Authorization (EUA). This EUA will remain  in effect (meaning this test can be used) for the duration of the COVID-19 declaration under Section 56 4(b)(1) of the Act, 21 U.S.C. section 360bbb-3(b)(1), unless the authorization is terminated or revoked  sooner. Performed at [facility], [place] [address] [place], [place] [address]     Coagulation Studies: Recent Labs    03/21/19 1525  LABPROT 16.0*  INR 1.3*    Imaging: No results found.  Medications:  I have reviewed the patient's current medications. Scheduled: . amLODipine  5 mg Oral Daily  . aspirin  325 mg Oral Daily  . atorvastatin  80 mg Oral q1800  . conjugated estrogens  1 Applicatorful Vaginal Once per day on Mon Thu  . donepezil  10 mg Oral BID  . ferrous sulfate  325 mg Oral BID WC  . levothyroxine  200 mcg Oral QAC breakfast    Assessment/Plan: 86 y.o. female with a past medical history significant for Alzheimer's dementia, PAF on Eliquis and ASA, hypertension, thyroid disease who comes in with altered mental status for the past few days.  MRI of the brain personally reviewed and shows acute, small infarcts in multiple vascular territories.  Embolic etiology likely.  Patient on ASA and Eliquis.  Due to the fact that patient on maximum medical therapy will attempt to address other risk factors to decrease stroke recurrence.   Carotid dopplers show no evidence of hemodynamically significant stenosis.  Echocardiogram pending.  A1c 6.1, LDL 63.  Stroke Risk Factors - atrial fibrillation, hyperlipidemia and hypertension  Plan: 1. PT consult, OT consult, Speech therapy 2. Echocardiogram pending 3. Prophylactic therapy-Continue anticoagulation and ASA.  Continue statin 4. Telemetry monitoring 5. Frequent neuro checks  No further neurologic intervention is recommended at this time.  If further questions arise, please call or page at that time.  Thank you for allowing neurology to participate in the care of this patient.    LOS: 2 days   Alexis Goodell, MD Neurology [phone] 03/23/2019  12:04 PM

## 2019-03-23 NOTE — Progress Notes (Signed)
*  PRELIMINARY RESULTS* Echocardiogram 2D Echocardiogram has been performed.  Sherrie Sport 03/23/2019, 9:41 AM

## 2019-03-23 NOTE — Evaluation (Addendum)
Clinical/Bedside Swallow Evaluation Patient Details  Name: Haley Brooks MRN: [id number] Date of Birth: [birth year]  Today's Date: 03/23/2019 Time: SLP Start Time (ACUTE ONLY): 4098 SLP Stop Time (ACUTE ONLY): 1335 SLP Time Calculation (min) (ACUTE ONLY): 40 min  Past Medical History:  Past Medical History:  Diagnosis Date  . Cancer [facility])    colon cancer   . Hemorrhoid   . Hypertension   . Thyroid disease    Past Surgical History:  Past Surgical History:  Procedure Laterality Date  . COLON SURGERY    . FRACTURE SURGERY     HPI:  Pt is a 86 y.o. female with medical history significant for Alzheimer's Dementia, PAF on Eliquis and ASA, hypertension, thyroid disease, kidney disease stage IV, thyroid disease who was brought into the emergency room for evaluation of change in mental status.  Her daughter who provided most of the history stated that she appeared more confused than normal.  She has had mental status changes in the past related to a UTI, and so she wanted her to get checked to make sure she did not have a urinary tract infection.  Patient also had a fall at home with complaints of back pain.  Patient's MRI showed multifocal acute ischemia largest area in the left parietal lobe.  Per Neurology note, pt is awake, alert but disoriented.  Speech fluent without evidence of overt Aphasia; speech clear.  She was able to follow simple commands without difficulty.     Assessment / Plan / Recommendation Clinical Impression  Pt appears to present w/ adequate oropharyngeal phase swallow function w/ no neuromuscular deficits noted; pt appeared to present w/ min Cognitive decline (noted baseline dx of Dementia) and needed verbal cues as gentle reminders during follow through of instructions w/ po trials/meal. Pt given min support to sit fully upright and verbal instruction to follow general aspiration precautions; no overt clinical s/s of aspiration were noted. Pt consumed trials of thin  liquids, purees and soft solids w/ no overt coughing or decline in respiratory status; vocal quality clear b/t trials. Oral phase wfl for bolus management of sips and bites. No oral residue noted; A-P transfer time adequate. Given time, pt managed boluses adequately and cleared orally. Pt fed self w/ setup support but needed min encouragement. OM exam revealed no overt lingual/labial weakness or unilateral weakness. Pt swallowed Pills from NSG Whole w/ tea during this session as well w/ no overt s/s of aspiration noted. Recommend a more Mech Soft diet consistency(for ease of setup and self-feeding) w/ thin liquids w/ general aspiration precautions; Pills Whole w/ water but Whole in puree if needed. Support at meals to sit upright/forward during oral intake for safety. Reduce distractions at meals. Determine foods of pt's interest/like for meals. ST services can be available for any further needs while admitted. Aspiration precautions posted in room.   Addendum: re: pt's Cognitive-Linguistic abilities, noted Neurologist's note today indicating clear speech, no overt Aphasia, and ability to follow basic commands. For this SLP, pt was oriented to self and hospital; she needed gentle verbal cues for following multiple step instructions and repetition in conversation as complexity increased somewhat -- suspect this could be more related to her Baseline Dementia. She was not interested in much po intake; needed encouragement for bites/sips. Recommend that when pt returns home or to a more structured setting w/ family being able to endorse Baseline communication status, assessment of Cognitive-linguistic skills can be completed then for best insight as to pt's abilities  both baseline/current. MD updated.  SLP Visit Diagnosis: Dysphagia, unspecified (R13.10)(baseline Cognitive decline)    Aspiration Risk  Risk for inadequate nutrition/hydration(baseline Cognitive decline)    Diet Recommendation  Regular/Mech Soft  diet (for ease of cut meats for self-feeding); Thin liquids. General aspiration precautions. Tray setup at meals; encouragement w/ po's. Dietician f/u for support.  Medication Administration: Whole meds with liquid(vs Whole in puree if needed )    Other  Recommendations Recommended Consults: (Dietician f/u for support) Oral Care Recommendations: Oral care BID;Oral care before and after PO;Staff/trained caregiver to provide oral care(while admitted) Other Recommendations: (n/a)   Follow up Recommendations None      Frequency and Duration (n/a)  (n/a)       Prognosis Prognosis for Safe Diet Advancement: Good Barriers to Reach Goals: Cognitive deficits;Time post onset;Severity of deficits(baseline; new CVA) Barriers/Prognosis Comment: pt's appetite is diminished per pt report      Swallow Study   General Date of Onset: 03/21/19 HPI: Pt is a 86 y.o. female with medical history significant for Alzheimer's Dementia, PAF on Eliquis and ASA, hypertension, thyroid disease, kidney disease stage IV, thyroid disease who was brought into the emergency room for evaluation of change in mental status.  Her daughter who provided most of the history stated that she appeared more confused than normal.  She has had mental status changes in the past related to a UTI, and so she wanted her to get checked to make sure she did not have a urinary tract infection.  Patient also had a fall at home with complaints of back pain.  Patient's MRI showed multifocal acute ischemia largest area in the left parietal lobe.  Per Neurology note, pt is awake, alert but disoriented.  Speech fluent without evidence of overt Aphasia; speech clear.  She was able to follow simple commands without difficulty.   Type of Study: Bedside Swallow Evaluation Previous Swallow Assessment: none Diet Prior to this Study: Regular;Thin liquids Temperature Spikes Noted: No(wbc 7.7) Respiratory Status: Room air History of Recent Intubation:  No Behavior/Cognition: Alert;Cooperative;Pleasant mood;Confused;Distractible;Requires cueing(min) Oral Cavity Assessment: Within Functional Limits Oral Care Completed by SLP: Yes Oral Cavity - Dentition: Adequate natural dentition Vision: Functional for self-feeding Self-Feeding Abilities: Able to feed self;Needs assist;Needs set up Patient Positioning: Upright in bed(needed positioning support) Baseline Vocal Quality: Normal(adequate) Volitional Cough: Strong(adequate) Volitional Swallow: Able to elicit    Oral/Motor/Sensory Function Overall Oral Motor/Sensory Function: Within functional limits   Ice Chips Ice chips: Not tested   Thin Liquid Thin Liquid: Within functional limits Presentation: Self Fed;Straw(~3 ozs total)    Nectar Thick Nectar Thick Liquid: Not tested   Honey Thick Honey Thick Liquid: Not tested   Puree Puree: Within functional limits Presentation: Spoon(fed; 3 trials)   Solid     Solid: Within functional limits Presentation: Spoon(fed; 3 trials)       Orinda Kenner, MS, CCC-SLP Sylvie Mifsud 03/23/2019,1:51 PM

## 2019-03-23 NOTE — NC FL2 (Signed)
[place] LEVEL OF CARE SCREENING TOOL     IDENTIFICATION  Patient Name: Haley Brooks Birthdate: 02/26/1931 Sex: female Admission Date (Current Location): 03/21/2019  [place] and [place] Number:  Engineering geologist and Address:  [facility], [address], [place], [place] [address]      Provider Number: [id number]  Attending Physician Name and Address:  Nolberto Hanlon, MD  Relative Name and Phone Number:  Sherre Scarlet - daughter [phone]    Current Level of Care: Hospital Recommended Level of Care: [place] Prior Approval Number:    Date Approved/Denied:   PASRR Number:    Discharge Plan: SNF    Current Diagnoses: Patient Active Problem List   Diagnosis Date Noted  . Altered mental status   . CVA (cerebral vascular accident) ([place]) 03/21/2019  . AF (paroxysmal atrial fibrillation) ([place]) 03/21/2019  . Acute CVA (cerebrovascular accident) ([place]) 03/21/2019  . Anemia due to stage 4 chronic kidney disease ([place]) 03/21/2019  . Acute delirium 02/21/2017  . Alzheimer's dementia ([place]) 02/21/2017  . Acute encephalopathy 02/18/2017  . Acute metabolic encephalopathy 38/18/2993  . UTI (urinary tract infection) 12/24/2016  . Hemorrhoid   . History of colon cancer 04/05/2015  . Chronic anemia 03/10/2015  . Osteopenia 12/11/2014  . SDAT (senile dementia of Alzheimer's type) ([place]) 06/18/2014  . Acquired hypothyroidism 03/01/2014  . Essential hypertension 03/01/2014  . Mixed hyperlipidemia 03/01/2014    Orientation RESPIRATION BLADDER Height & Weight     Self  Normal Incontinent Weight: 63.5 kg Height:  5\' 4"  (162.6 cm)  BEHAVIORAL SYMPTOMS/MOOD NEUROLOGICAL BOWEL NUTRITION STATUS      Continent Diet(2 gram sodium diet)  AMBULATORY STATUS COMMUNICATION OF NEEDS Skin   Limited Assist Verbally Normal                       Personal Care Assistance Level of Assistance  Bathing, Feeding, Dressing  Bathing Assistance: Limited assistance Feeding assistance: Limited assistance Dressing Assistance: Limited assistance     Functional Limitations Info             SPECIAL CARE FACTORS FREQUENCY  PT (By licensed PT), OT (By licensed OT)     PT Frequency: 7 days per week OT Frequency: 3 times per week            Contractures Contractures Info: Not present    Additional Factors Info  Code Status, Allergies Code Status Info: Full Allergies Info: NKA           Current Medications (03/23/2019):  This is the current hospital active medication list Current Facility-Administered Medications  Medication Dose Route Frequency Provider Last Rate Last Admin  . 0.9 %  sodium chloride infusion   Intravenous PRN Nolberto Hanlon, MD      . acetaminophen (TYLENOL) tablet 650 mg  650 mg Oral Q4H PRN Agbata, Tochukwu, MD   650 mg at 03/23/19 1702   Or  . acetaminophen (TYLENOL) 160 MG/5ML solution 650 mg  650 mg Per Tube Q4H PRN Agbata, Tochukwu, MD       Or  . acetaminophen (TYLENOL) suppository 650 mg  650 mg Rectal Q4H PRN Agbata, Tochukwu, MD      . Derrill Memo ON 03/24/2019] amLODipine (NORVASC) tablet 10 mg  10 mg Oral Daily Nolberto Hanlon, MD      . apixaban (ELIQUIS) tablet 5 mg  5 mg Oral BID Nolberto Hanlon, MD   5 mg at 03/23/19 1638  .  aspirin EC tablet 325 mg  325 mg Oral Daily Agbata, Tochukwu, MD   325 mg at 03/23/19 1000  . atorvastatin (LIPITOR) tablet 80 mg  80 mg Oral q1800 Agbata, Tochukwu, MD   80 mg at 03/23/19 1702  . [START ON 03/24/2019] benazepril (LOTENSIN) tablet 10 mg  10 mg Oral Daily White, Delicia, NP      . cefTRIAXone (ROCEPHIN) 1 g in sodium chloride 0.9 % 100 mL IVPB  1 g Intravenous Q24H Agbata, Tochukwu, MD   Stopped at 03/23/19 1030  . conjugated estrogens (PREMARIN) vaginal cream 1 Applicatorful  1 Applicatorful Vaginal Once per day on Mon Thu Agbata, Tochukwu, MD      . donepezil (ARICEPT) tablet 10 mg  10 mg Oral BID Agbata, Tochukwu, MD   10 mg at 03/23/19 0959   . ferrous sulfate tablet 325 mg  325 mg Oral BID WC Agbata, Tochukwu, MD   325 mg at 03/23/19 1702  . hydrALAZINE (APRESOLINE) injection 10 mg  10 mg Intravenous Q6H PRN Nolberto Hanlon, MD   10 mg at 03/23/19 1006  . levothyroxine (SYNTHROID) tablet 200 mcg  200 mcg Oral QAC breakfast Agbata, Tochukwu, MD   200 mcg at 03/23/19 0636  . senna-docusate (Senokot-S) tablet 2 tablet  2 tablet Oral BID Nolberto Hanlon, MD   2 tablet at 03/23/19 1702     Discharge Medications: Please see discharge summary for a list of discharge medications.  Relevant Imaging Results:  Relevant Lab Results:   Additional Information SS# [id number]  Shelbie Hutching, RN

## 2019-03-23 NOTE — Consult Note (Signed)
ANTICOAGULATION CONSULT NOTE   Pharmacy Consult for Heparin  Indication: atrial fibrillation and with multifocal infarct  No Known Allergies  Patient Measurements: Height: 5\' 4"  (162.6 cm) Weight: 140 lb (63.5 kg) IBW/kg (Calculated) : 54.7 Heparin Dosing Weight: 63.5 kg   Vital Signs: Temp: 98.8 F (37.1 C) (03/21 2339) Temp Source: Axillary (03/21 2339) BP: 174/66 (03/21 2339) Pulse Rate: 58 (03/21 2339)  Labs: Recent Labs    03/20/19 1923 03/21/19 1525 03/21/19 1540 03/22/19 0025 03/22/19 0747 03/22/19 1550 03/22/19 2335  HGB 12.1 11.0*  --   --   --   --   --   HCT 37.7 34.6*  --   --   --   --   --   PLT 182 163  --   --   --   --   --   APTT  --  28  --    < > 85* 59* 88*  LABPROT  --  16.0*  --   --   --   --   --   INR  --  1.3*  --   --   --   --   --   HEPARINUNFRC  --   --  2.00*  --   --   --   --   CREATININE 1.51*  --   --   --   --   --   --   TROPONINIHS 6  --   --   --   --   --   --    < > = values in this interval not displayed.    Estimated Creatinine Clearance: 23.5 mL/min (A) (by C-G formula based on SCr of 1.51 mg/dL (H)).   Medications:  Eliquis - last dose unknown. Per patient she took the last dose this AM. However, she has been admitted in the ED since ~1900 on 03/20/19.  Assessment: Patient has a PMH for Afib and is on Eliquis who presents with AMS 2/2 to UTI/stroke. MRI of the brain reviewed and shows acute, small infarcts in multiple vascular territories.  Embolic etiology likely.  Baseline labs need to be ordered. Given has been here for almost 20 hours will bolus heparin. Will base dose on aPTT until HL correlates with aPTT.  Goal of Therapy:  Heparin level 0.3-0.7 units/ml aPTT 66-102 seconds Monitor platelets by anticoagulation protocol: Yes   Plan:  3/21 2335 aPTT 88 therapeutic x 1. Continue heparin drip at 700 units/hr and recheck aPTT in 8 hours to confirm.  CBC daily.   Ena Dawley, PharmD 03/23/2019,1:08 AM

## 2019-03-23 NOTE — TOC Progression Note (Signed)
Transition of Care [facility]) - Progression Note    Patient Details  Name: Haley Brooks MRN: [id number] Date of Birth: November 09, 1931  Transition of Care [facility]) CM/SW Contact  Shelbie Hutching, RN Phone Number: 03/23/2019, 5:15 PM  Clinical Narrative:    Patient worked with PT again today and did not do as well, they are now recommending SNF.  Patient's daughter agrees that skilled rehab would be beneficial and prefers [facility] in [place].  Bed request will be sent out to [facility] now, RNCM will follow up tomorrow.   Expected Discharge Plan: Skilled Nursing Facility Barriers to Discharge: SNF Pending bed offer  Expected Discharge Plan and Services Expected Discharge Plan: [place] arrangements for the past 2 months: Single Family Home Expected Discharge Date: 03/23/19                         HH Arranged: PT, OT, Nurse's Aide [place] Agency: [place] ([place]) Date [place]: 03/23/19 Time HH Agency Contacted: 1000 Representative spoke with at [place]: [place] ([place]) Interventions    Readmission Risk Interventions No flowsheet data found.

## 2019-03-24 MED ORDER — TRAZODONE HCL 50 MG PO TABS
50.0000 mg | ORAL_TABLET | Freq: Every evening | ORAL | Status: DC | PRN
  Administered 2019-03-24: 50 mg via ORAL
  Filled 2019-03-24: qty 1

## 2019-03-24 MED ORDER — SENNOSIDES-DOCUSATE SODIUM 8.6-50 MG PO TABS: 2.0000 | ORAL_TABLET | Freq: Two times a day (BID) | ORAL | Status: DC

## 2019-03-24 MED ORDER — HYDROCHLOROTHIAZIDE 12.5 MG PO CAPS
12.5000 mg | ORAL_CAPSULE | Freq: Every day | ORAL | Status: DC
  Administered 2019-03-24: 11:00:00 12.5 mg via ORAL
  Filled 2019-03-24: qty 1

## 2019-03-24 NOTE — Progress Notes (Addendum)
SLP F/U Note:  Patient Details Name: Haley Brooks MRN: [id number] DOB: January 12, 1931   Cancelled treatment:       Reason Eval/Treat Not Completed: (chart reviewed). Met w/ pt during her Lunch meal - pt denied any difficulty swallowing the lunch meal items including thin liquids on tray. Pt was feeding self post tray setup given. No overt s/s of aspiration noted while in room w/ pt. NSG reported same. Pt conversed in general conversation re: her lunch meal; self. Speech clear; pt was able to indicated her wants/needs to SLP. Pt has a baseline of Dementia which can impact cognitive-linguistic abilities. No further skilled ST services indicated. Recommend continue current diet w/ Cut meats, moistened/soft foods easy to eat, thin liquids. General aspiration precautions and Pills w/ Puree for safer swallowing. NSG to reconsult if any new needs while admitted.     Orinda Kenner, MS, CCC-SLP [place] Fowle 03/24/2019, 12:53 PM

## 2019-03-24 NOTE — TOC Progression Note (Addendum)
Transition of Care [facility]) - Progression Note    Patient Details  Name: Haley Brooks MRN: [id number] Date of Birth: [birth year]  Transition [facility] Care [facility]) CM/SW Contact  Shelbie Hutching, RN Phone Number: 03/24/2019, 9:50 AM  Clinical Narrative:    Waiting for a return call from [facility] to see if they can offer a bed.  Insurance authorization started with [facility] reference # [id number].    Expected Discharge Plan: Skilled Nursing Facility Barriers to Discharge: SNF Pending bed offer  Expected Discharge Plan and Services Expected Discharge Plan: [place] arrangements for the past 2 months: Single Family Home Expected Discharge Date: 03/23/19                         HH Arranged: PT, OT, Nurse's Aide [place] Agency: [place] ([place]) Date [place]: 03/23/19 Time HH Agency Contacted: 1000 Representative spoke with at [place]: [place] ([place]) Interventions    Readmission Risk Interventions No flowsheet data found.

## 2019-03-24 NOTE — Progress Notes (Signed)
Physical Therapy Treatment Patient Details Name: Haley Brooks MRN: [id number] DOB: [birth year] Today's Date: 03/24/2019    History of Present Illness Per MD note:86 year old female history of dementia paroxysmal atrial fibrillation atrial flutter on supposedly Eliquis 5 mg twice a day hypertension renal insufficiency thyroid disease dementia brought to emergency room with a change in mental status.  Daughter states that the patient started behaving more confused they were concerned that she may have a urinary tract infection patient underwent CT of the head which showed new hypoattenuation in the left parietal lobe patient then had MRI which suggested left parietal lobe multiple areas of acute ischemia scattered punctate foci within both cerebral hemispheres and right cerebral and multiple vascular territories.  Because the patient dementia she is not able to help with most of the history there is been no history of chest pain no shortness of breath no blackout spells or syncope.    PT Comments    Pt was finishing breakfast upon arriving. She agrees to PT session and is cooperative throughout. Pt demonstrates poor short term memory however was able to follow commands well throughout. Resting vitals: HR 82 BP 157/72 and sao2 95%. Pt requesting to get OOB to recliner and is motivated to get better. She was able to exit L side of bed with increased time and min assist. Vcs and tactle cues throughout for technique and sequencing improvements. Sat EOB x 3 minutes prior to standing and ambulating to chair. HR elevated quickly in standing to 125 bpm but in A fib throughout. Did not advance gait distances 2/2 to HR arrhythmias. She was repositioned in recliner post session with call bell in reach and chair alarm set. BLEs elevated and RN aware of her abilities. Pt would benefit from SNF at DC to address deficits with strength, gait, and overall safe functional mobility. Acute PT to continue to follow per  POC.   Follow Up Recommendations  SNF     Equipment Recommendations  Rolling walker with 5" wheels;3in1 (PT)    Recommendations for Other Services       Precautions / Restrictions Precautions Precautions: Fall Restrictions Weight Bearing Restrictions: No Other Position/Activity Restrictions: Watch HR    Mobility  Bed Mobility Overal bed mobility: Needs Assistance Bed Mobility: Supine to Sit     Supine to sit: Min assist     General bed mobility comments: increased time to perform task with vcs and min assist required.  Transfers Overall transfer level: Needs assistance Equipment used: Rolling walker (2 wheeled) Transfers: Sit to/from Stand Sit to Stand: Min assist         General transfer comment: Min assist to stand from EOB to RW. HR in A fib. quickly elevated to 125bpm from 82bpm. Pt reports no symptoms however therapist did not feel safe to progress further than ambulation to chair.  Ambulation/Gait Ambulation/Gait assistance: Min assist Gait Distance (Feet): 3 Feet Assistive device: Rolling walker (2 wheeled) Gait Pattern/deviations: Step-through pattern;Decreased step length - right;Decreased step length - left;Trunk flexed Gait velocity: decreased   General Gait Details: generally hesitant and fearful   Stairs             Wheelchair Mobility    Modified Rankin (Stroke Patients Only)       Balance  Cognition Arousal/Alertness: Awake/alert Behavior During Therapy: WFL for tasks assessed/performed Overall Cognitive Status: Within Functional Limits for tasks assessed Area of Impairment: Memory;Following commands;Safety/judgement                     Memory: Decreased short-term memory Following Commands: Follows one step commands inconsistently Safety/Judgement: Decreased awareness of safety;Decreased awareness of deficits     General Comments: Pt has history of  dementia. Pt feel like she is not thinkiing straight this morning however was able to follow commands without difficulty      Exercises      General Comments        Pertinent Vitals/Pain Pain Assessment: No/denies pain    Home Living                      Prior Function            PT Goals (current goals can now be found in the care plan section) Acute Rehab PT Goals Patient Stated Goal: to go home Progress towards PT goals: Progressing toward goals    Frequency    7X/week      PT Plan Current plan remains appropriate    Co-evaluation              AM-PAC PT "6 Clicks" Mobility   Outcome Measure  Help needed turning from your back to your side while in a flat bed without using bedrails?: A Little Help needed moving from lying on your back to sitting on the side of a flat bed without using bedrails?: A Little Help needed moving to and from a bed to a chair (including a wheelchair)?: A Little Help needed standing up from a chair using your arms (e.g., wheelchair or bedside chair)?: A Little Help needed to walk in hospital room?: A Little Help needed climbing 3-5 steps with a railing? : A Lot 6 Click Score: 17    End of Session Equipment Utilized During Treatment: Gait belt Activity Tolerance: Patient limited by fatigue;Treatment limited secondary to medical complications (Comment)(limited by uncontrolled A fib) Patient left: in chair;with call bell/phone within reach;with chair alarm set Nurse Communication: Mobility status PT Visit Diagnosis: Unsteadiness on feet (R26.81);Muscle weakness (generalized) (M62.81);History of falling (Z91.81);Difficulty in walking, not elsewhere classified (R26.2)     Time: 6440-3474 PT Time Calculation (min) (ACUTE ONLY): 15 min  Charges:  $Therapeutic Activity: 8-22 mins                     Julaine Fusi PTA 03/24/19, 10:52 AM

## 2019-03-24 NOTE — Progress Notes (Signed)
PROGRESS NOTE    Haley Brooks  GSU:[id number] DOB: [birth year] DOA: 03/21/2019 PCP: Kirk Ruths, MD    Brief Narrative:  Haley Brooks a 86 Brooks medical history significant forparoxysmal atrial fibrillation on anticoagulation,hypertension,kidney disease stage IV,thyroid disease and dementia who was brought into the emergency room for evaluation of change in mental status.    Consultants:   neurology  Procedures: ct, mri, echo, carotid US  Antimicrobials:      Subjective: No sob, no complaints  Objective: Vitals:   03/24/19 0011 03/24/19 0013 03/24/19 0817 03/24/19 1126  BP: (!) 143/70  (!) 164/48 (!) 158/55  Pulse: (!) 46 (!) 50 85   Resp: 18  20   Temp: 98.2 F (36.8 C)  97.8 F (36.6 C)   TempSrc: Axillary  Oral   SpO2: 95% 96% 99%   Weight:      Height:        Intake/Output Summary (Last 24 hours) at 03/24/2019 1318 Last data filed at 03/24/2019 1126 Gross per 24 hour  Intake 128.31 ml  Output --  Net 128.31 ml   Filed Weights   03/20/19 1920  Weight: 63.5 kg    Examination:  General exam: Appears calm and comfortable  Respiratory system: Clear to auscultation. Respiratory effort normal. Cardiovascular system: S1 & S2 heard, RRR. No JVD, murmurs, rubs, gallops or clicks. No pedal edema. Gastrointestinal system: Abdomen is nondistended, soft and nontender. No organomegaly or masses felt. Normal bowel sounds heard. Central nervous system: Alert and oriented. No focal neurological deficits. Extremities: Symmetric 5 x 5 power. Skin: No rashes, lesions or ulcers Psychiatry: Judgement and insight appear normal. Mood & affect appropriate.     Data Reviewed: I have personally reviewed following labs and imaging studies  CBC: Recent Labs  Lab 03/20/19 1923 03/21/19 1525 03/23/19 0625  WBC 8.9 8.8 7.7  HGB 12.1 11.0* 10.9*  HCT 37.7 34.6* 33.6*  MCV 92.4 93.3 90.6  PLT 182 163 859   Basic Metabolic  Panel: Recent Labs  Lab 03/20/19 1923 03/23/19 0624  NA 139  --   K 3.7  --   CL 101  --   CO2 30  --   GLUCOSE 110*  --   BUN 27*  --   CREATININE 1.51* 1.33*  CALCIUM 9.4  --    GFR: Estimated Creatinine Clearance: 26.7 mL/min (A) (by C-G formula based on SCr of 1.33 mg/dL (H)). Liver Function Tests: Recent Labs  Lab 03/20/19 1923  AST 32  ALT 17  ALKPHOS 71  BILITOT 0.6  PROT 7.0  ALBUMIN 3.4*   No results for input(s): LIPASE, AMYLASE in the last 168 hours. No results for input(s): AMMONIA in the last 168 hours. Coagulation Profile: Recent Labs  Lab 03/21/19 1525  INR 1.3*   Cardiac Enzymes: No results for input(s): CKTOTAL, CKMB, CKMBINDEX, TROPONINI in the last 168 hours. BNP (last 3 results) No results for input(s): PROBNP in the last 8760 hours. HbA1C: Recent Labs    03/22/19 0747  HGBA1C 6.1*   CBG: No results for input(s): GLUCAP in the last 168 hours. Lipid Profile: Recent Labs    03/22/19 0747  CHOL 140  HDL 67  LDLCALC 63  TRIG 52  CHOLHDL 2.1   Thyroid Function Tests: No results for input(s): TSH, T4TOTAL, FREET4, T3FREE, THYROIDAB in the last 72 hours. Anemia Panel: No results for input(s): VITAMINB12, FOLATE, FERRITIN, TIBC, IRON, RETICCTPCT in the last 72 hours. Sepsis Labs: No results for input(s):  PROCALCITON, LATICACIDVEN in the last 168 hours.  Recent Results (from the past 240 hour(s))  SARS CORONAVIRUS 2 (TAT 6-24 HRS) Nasopharyngeal Nasopharyngeal Swab     Status: None   Collection Time: 03/21/19  6:48 AM   Specimen: Nasopharyngeal Swab  Result Value Ref Range Status   SARS Coronavirus 2 NEGATIVE NEGATIVE Final    Comment: (NOTE) SARS-CoV-2 target nucleic acids are NOT DETECTED. The SARS-CoV-2 RNA is generally detectable in upper and lower respiratory specimens during the acute phase of infection. Negative results do not preclude SARS-CoV-2 infection, do not rule out co-infections with other pathogens, and should not  be used as the sole basis for treatment or other patient management decisions. Negative results must be combined with clinical observations, patient history, and epidemiological information. The expected result is Negative. Fact Sheet for Patients: SugarRoll.be Fact Sheet for Healthcare Providers: https://www.woods-mathews.com/ This test is not yet approved or cleared by the [place] FDA and  has been authorized for detection and/or diagnosis of SARS-CoV-2 by FDA under an Emergency Use Authorization (EUA). This EUA will remain  in effect (meaning this test can be used) for the duration of the COVID-19 declaration under Section 56 4(b)(1) of the Act, 21 U.S.C. section 360bbb-3(b)(1), unless the authorization is terminated or revoked sooner. Performed at [facility], [place] [address] [place], [place] [address]          Radiology Studies: ECHOCARDIOGRAM COMPLETE  Result Date: 03/23/2019    ECHOCARDIOGRAM REPORT   Patient Name:   Haley Brooks Date of Exam: 03/23/2019 Medical Rec #:  [id number]         Height:       64.0 in Accession #:    [id number]        Weight:       140.0 lb Date of Birth:  [birth year]        BSA:          1.681 m Patient Age:    33 years          BP:           163/103 mmHg Patient Gender: F                 HR:           58 bpm. Exam Location:  [facility] Procedure: 2D Echo, Cardiac Doppler and Color Doppler Indications:     Stroke 434.91  History:         Patient has no prior history of Echocardiogram examinations.                  Risk Factors:Hypertension.  Sonographer:     Sherrie Sport RDCS (AE) Referring Phys:  [employer] Diagnosing Phys: Serafina Royals MD IMPRESSIONS  1. Left ventricular ejection fraction, by estimation, is 45 to 50%. The left ventricle has mildly decreased function. The left ventricle demonstrates global hypokinesis. There is severe left ventricular hypertrophy. Left ventricular diastolic  parameters  are consistent with Grade II diastolic dysfunction (pseudonormalization).  2. Right ventricular systolic function is normal. The right ventricular size is normal. There is normal pulmonary artery systolic pressure.  3. Left atrial size was severely dilated.  4. The mitral valve is degenerative. Moderate mitral valve regurgitation.  5. The aortic valve is normal in structure. Aortic valve regurgitation is mild. FINDINGS  Left Ventricle: Left ventricular ejection fraction, by estimation, is 45 to 50%. The left ventricle has mildly decreased function. The left ventricle demonstrates global  hypokinesis. The left ventricular internal cavity size was small. There is severe left ventricular hypertrophy. Left ventricular diastolic parameters are consistent with Grade II diastolic dysfunction (pseudonormalization). Right Ventricle: The right ventricular size is normal. No increase in right ventricular wall thickness. Right ventricular systolic function is normal. There is normal pulmonary artery systolic pressure. The tricuspid regurgitant velocity is 1.43 m/s, and  with an assumed right atrial pressure of 10 mmHg, the estimated right ventricular systolic pressure is 16.3 mmHg. Left Atrium: Left atrial size was severely dilated. Right Atrium: Right atrial size was normal in size. Pericardium: There is no evidence of pericardial effusion. Mitral Valve: The mitral valve is degenerative in appearance. Moderate mitral valve regurgitation. MV peak gradient, 11.3 mmHg. The mean mitral valve gradient is 5.0 mmHg. Tricuspid Valve: The tricuspid valve is normal in structure. Tricuspid valve regurgitation is mild. Aortic Valve: The aortic valve is normal in structure. Aortic valve regurgitation is mild. Aortic regurgitation PHT measures 802 msec. Aortic valve mean gradient measures 3.0 mmHg. Aortic valve peak gradient measures 6.2 mmHg. Aortic valve area, by VTI measures 2.35 cm. Pulmonic Valve: The pulmonic valve was  normal in structure. Pulmonic valve regurgitation is not visualized. Aorta: The aortic root and ascending aorta are structurally normal, with no evidence of dilitation. IAS/Shunts: No atrial level shunt detected by color flow Doppler.  LEFT VENTRICLE PLAX 2D LVIDd:         3.02 cm  Diastology LVIDs:         1.97 cm  LV e' lateral:   4.13 cm/s LV PW:         1.23 cm  LV E/e' lateral: 35.8 LV IVS:        1.63 cm  LV e' medial:    4.35 cm/s LVOT diam:     2.00 cm  LV E/e' medial:  34.0 LV SV:         62 LV SV Index:   37 LVOT Area:     3.14 cm  RIGHT VENTRICLE RV Basal diam:  2.58 cm RV S prime:     6.09 cm/s TAPSE (M-mode): 2.6 cm LEFT ATRIUM              Index       RIGHT ATRIUM           Index LA diam:        5.70 cm  3.39 cm/m  RA Area:     14.80 cm LA Vol (A2C):   147.0 ml 87.44 ml/m RA Volume:   40.10 ml  23.85 ml/m LA Vol (A4C):   127.0 ml 75.54 ml/m LA Biplane Vol: 138.0 ml 82.08 ml/m  AORTIC VALVE                   PULMONIC VALVE AV Area (Vmax):    2.23 cm    PV Vmax:        0.83 m/s AV Area (Vmean):   2.78 cm    PV Peak grad:   2.8 mmHg AV Area (VTI):     2.35 cm    RVOT Peak grad: 5 mmHg AV Vmax:           124.00 cm/s AV Vmean:          75.150 cm/s AV VTI:            0.264 m AV Peak Grad:      6.2 mmHg AV Mean Grad:      3.0 mmHg LVOT Vmax:  87.90 cm/s LVOT Vmean:        66.400 cm/s LVOT VTI:          0.198 m LVOT/AV VTI ratio: 0.75 AI PHT:            802 msec  AORTA Ao Root diam: 2.80 cm MITRAL VALVE                TRICUSPID VALVE MV Area (PHT): 2.82 cm     TR Peak grad:   8.2 mmHg MV Peak grad:  11.3 mmHg    TR Vmax:        143.00 cm/s MV Mean grad:  5.0 mmHg MV Vmax:       1.68 m/s     SHUNTS MV Vmean:      93.3 cm/s    Systemic VTI:  0.20 m MV Decel Time: 269 msec     Systemic Diam: 2.00 cm MV E velocity: 148.00 cm/s MV A velocity: 67.90 cm/s MV E/A ratio:  2.18 Serafina Royals MD Electronically signed by Serafina Royals MD Signature Date/Time: 03/23/2019/12:28:08 PM    Final          Scheduled Meds: . amLODipine  10 mg Oral Daily  . apixaban  5 mg Oral BID  . aspirin  325 mg Oral Daily  . atorvastatin  80 mg Oral q1800  . benazepril  10 mg Oral Daily  . conjugated estrogens  1 Applicatorful Vaginal Once per day on Mon Thu  . donepezil  10 mg Oral BID  . ferrous sulfate  325 mg Oral BID WC  . hydrochlorothiazide  12.5 mg Oral Daily  . levothyroxine  200 mcg Oral QAC breakfast  . senna-docusate  2 tablet Oral BID   Continuous Infusions: . sodium chloride Stopped (03/24/19 1124)  . cefTRIAXone (ROCEPHIN)  IV Stopped (03/24/19 1035)    Assessment & Plan:   Principal Problem:   CVA (cerebral vascular accident) ([place]) Active Problems:   Acquired hypothyroidism   Essential hypertension   UTI (urinary tract infection)   Alzheimer's dementia ([place])   AF (paroxysmal atrial fibrillation) ([facility])   Acute CVA (cerebrovascular accident) ([place])   Anemia due to stage 4 chronic kidney disease ([place])   Altered mental status   Acute CVA Patient presented for evaluation of mental status changes described as worsening confusion by family. She has a known history of paroxysmal atrial fibrillation CT/MRI as above- multifocal acute ischemia. Largest area is in the left parietal lobe. Numerous chronic microhemorrhages in a predominantly central distribution, consistent with chronic hypertensive angiopathy. (see full report) OT/PT-home OT/PT Neurology following-acute, small infarcts in multiple vascular territories. Embolic etiology likely. Patient on aspirin and heparin drip Eliquis held Echo  EF 40-50%.    Urinary tract infection Patient with significant pyuria Treating  empirically with Rocephin 1 g IV  Culture grew multiple organisms needs recollection-abx dc    History of paroxysmal atrial fibrillation Patientissupposed to be on Eliquisassecondary prophylaxis for an acute CVAand has been complaint Hold Eliquis for now, on heparin  drip    Chronic kidney disease stage IV Stable   Hypertension Allow for permissive hypertension due to acute CVA Started on IV hydralazine as needed and amlodipine 5 mg daily  Hypothyroidism Continue Synthroid   DVT prophylaxis: Heparin Code Status: Full Family Communication: None at bedside Disposition Plan: snf today possibly waiting for authorization      LOS: 3 days   Time spent: 45 min with >50% coc    Nolberto Hanlon, MD  [facility] Pager [phone]  If 7PM-7AM, please contact night-coverage [email or web address] Password [email or web address] 03/24/2019, 1:18 PM

## 2019-03-24 NOTE — Progress Notes (Signed)
[facility] Cardiology  Patient Description: Ms. Hlavaty is an 86 year old African-American female with PMH significant for paroxysmal atrial fibrillation on anticoagulation with Eliquis, hypertension, kidney disease stage IV, thyroid disease and dementia who was admitted on 03/21/2019 for acute CVA.  Cardiology was consulted due to atrial fibrillation/flutter with bradycardia and CVA with altered mental status.  SUBJECTIVE: The patient reports to be doing fairly well on today and is without any complaints at this time. She states that she was able to sleep well on last night and denies any headache, chest pain, dizziness or palpitations. The patient reports that she has not been out of bed today, but is able to sit upright in bed to eat her breakfast independently.    OBJECTIVE: The patient appears to be fairly well but continues to be a poor historian of health. She appears to be more alert and oriented this morning, than she was on yesterday. The patient continues to be in atrial fibrillation on the monitor with a HR of 76; however, per documentation, her blood pressure is slowly increasing despite the recent increase in her benazepril dosage to 10mg  on yesterday.    Vitals:   03/23/19 1500 03/24/19 0011 03/24/19 0013 03/24/19 0817  BP: (!) 166/50 (!) 143/70  (!) 164/48  Pulse:  (!) 46 (!) 50 85  Resp: 18 18  20   Temp: 98.3 F (36.8 C) 98.2 F (36.8 C)  97.8 F (36.6 C)  TempSrc: Oral Axillary  Oral  SpO2: 98% 95% 96% 99%  Weight:      Height:         Intake/Output Summary (Last 24 hours) at 03/24/2019 1001 Last data filed at 03/24/2019 1000 Gross per 24 hour  Intake 419.04 ml  Output --  Net 419.04 ml      PHYSICAL EXAM  General: Well developed, moderately nourished, in no acute distress HEENT:  Normocephalic and atramatic Neck:  No JVD symmetrical  Lungs: Clear bilaterally to auscultation Heart: irregular heart rate and rhythm. Normal S1 and S2 with murmur noted. Negative for  gallops. Abdomen: Bowel sounds are positive, abdomen soft and non-tender  Msk: UTA Extremities: +1 BLE edema, No clubbing or cyanosis .   Neuro: Alert and oriented X 2-3 Psych:  Good affect, responds appropriately at times    LABS: Basic Metabolic Panel: Recent Labs    03/23/19 0624  CREATININE 1.33*   Liver Function Tests: No results for input(s): AST, ALT, ALKPHOS, BILITOT, PROT, ALBUMIN in the last 72 hours. No results for input(s): LIPASE, AMYLASE in the last 72 hours. CBC: Recent Labs    03/21/19 1525 03/23/19 0625  WBC 8.8 7.7  HGB 11.0* 10.9*  HCT 34.6* 33.6*  MCV 93.3 90.6  PLT 163 158   Cardiac Enzymes: No results for input(s): CKTOTAL, CKMB, CKMBINDEX, TROPONINI in the last 72 hours. BNP: Invalid input(s): POCBNP D-Dimer: No results for input(s): DDIMER in the last 72 hours. Hemoglobin A1C: Recent Labs    03/22/19 0747  HGBA1C 6.1*   Fasting Lipid Panel: Recent Labs    03/22/19 0747  CHOL 140  HDL 67  LDLCALC 63  TRIG 52  CHOLHDL 2.1   Thyroid Function Tests: No results for input(s): TSH, T4TOTAL, T3FREE, THYROIDAB in the last 72 hours.  Invalid input(s): FREET3 Anemia Panel: No results for input(s): VITAMINB12, FOLATE, FERRITIN, TIBC, IRON, RETICCTPCT in the last 72 hours.  ECHOCARDIOGRAM COMPLETE  Result Date: 03/23/2019    ECHOCARDIOGRAM REPORT   Patient Name:   Haley Brooks Date  of Exam: 03/23/2019 Medical Rec #:  [id number]         Height:       64.0 in Accession #:    [id number]        Weight:       140.0 lb Date of Birth:  08-Mar-1931        BSA:          1.681 m Patient Age:    63 years          BP:           163/103 mmHg Patient Gender: F                 HR:           58 bpm. Exam Location:  [facility] Procedure: 2D Echo, Cardiac Doppler and Color Doppler Indications:     Stroke 434.91  History:         Patient has no prior history of Echocardiogram examinations.                  Risk Factors:Hypertension.  Sonographer:     Sherrie Sport RDCS  (AE) Referring Phys:  [place] Diagnosing Phys: Serafina Royals MD IMPRESSIONS  1. Left ventricular ejection fraction, by estimation, is 45 to 50%. The left ventricle has mildly decreased function. The left ventricle demonstrates global hypokinesis. There is severe left ventricular hypertrophy. Left ventricular diastolic parameters  are consistent with Grade II diastolic dysfunction (pseudonormalization).  2. Right ventricular systolic function is normal. The right ventricular size is normal. There is normal pulmonary artery systolic pressure.  3. Left atrial size was severely dilated.  4. The mitral valve is degenerative. Moderate mitral valve regurgitation.  5. The aortic valve is normal in structure. Aortic valve regurgitation is mild. FINDINGS  Left Ventricle: Left ventricular ejection fraction, by estimation, is 45 to 50%. The left ventricle has mildly decreased function. The left ventricle demonstrates global hypokinesis. The left ventricular internal cavity size was small. There is severe left ventricular hypertrophy. Left ventricular diastolic parameters are consistent with Grade II diastolic dysfunction (pseudonormalization). Right Ventricle: The right ventricular size is normal. No increase in right ventricular wall thickness. Right ventricular systolic function is normal. There is normal pulmonary artery systolic pressure. The tricuspid regurgitant velocity is 1.43 m/s, and  with an assumed right atrial pressure of 10 mmHg, the estimated right ventricular systolic pressure is 73.2 mmHg. Left Atrium: Left atrial size was severely dilated. Right Atrium: Right atrial size was normal in size. Pericardium: There is no evidence of pericardial effusion. Mitral Valve: The mitral valve is degenerative in appearance. Moderate mitral valve regurgitation. MV peak gradient, 11.3 mmHg. The mean mitral valve gradient is 5.0 mmHg. Tricuspid Valve: The tricuspid valve is normal in structure. Tricuspid valve  regurgitation is mild. Aortic Valve: The aortic valve is normal in structure. Aortic valve regurgitation is mild. Aortic regurgitation PHT measures 802 msec. Aortic valve mean gradient measures 3.0 mmHg. Aortic valve peak gradient measures 6.2 mmHg. Aortic valve area, by VTI measures 2.35 cm. Pulmonic Valve: The pulmonic valve was normal in structure. Pulmonic valve regurgitation is not visualized. Aorta: The aortic root and ascending aorta are structurally normal, with no evidence of dilitation. IAS/Shunts: No atrial level shunt detected by color flow Doppler.  LEFT VENTRICLE PLAX 2D LVIDd:         3.02 cm  Diastology LVIDs:         1.97 cm  LV e' lateral:  4.13 cm/s LV PW:         1.23 cm  LV E/e' lateral: 35.8 LV IVS:        1.63 cm  LV e' medial:    4.35 cm/s LVOT diam:     2.00 cm  LV E/e' medial:  34.0 LV SV:         62 LV SV Index:   37 LVOT Area:     3.14 cm  RIGHT VENTRICLE RV Basal diam:  2.58 cm RV S prime:     6.09 cm/s TAPSE (M-mode): 2.6 cm LEFT ATRIUM              Index       RIGHT ATRIUM           Index LA diam:        5.70 cm  3.39 cm/m  RA Area:     14.80 cm LA Vol (A2C):   147.0 ml 87.44 ml/m RA Volume:   40.10 ml  23.85 ml/m LA Vol (A4C):   127.0 ml 75.54 ml/m LA Biplane Vol: 138.0 ml 82.08 ml/m  AORTIC VALVE                   PULMONIC VALVE AV Area (Vmax):    2.23 cm    PV Vmax:        0.83 m/s AV Area (Vmean):   2.78 cm    PV Peak grad:   2.8 mmHg AV Area (VTI):     2.35 cm    RVOT Peak grad: 5 mmHg AV Vmax:           124.00 cm/s AV Vmean:          75.150 cm/s AV VTI:            0.264 m AV Peak Grad:      6.2 mmHg AV Mean Grad:      3.0 mmHg LVOT Vmax:         87.90 cm/s LVOT Vmean:        66.400 cm/s LVOT VTI:          0.198 m LVOT/AV VTI ratio: 0.75 AI PHT:            802 msec  AORTA Ao Root diam: 2.80 cm MITRAL VALVE                TRICUSPID VALVE MV Area (PHT): 2.82 cm     TR Peak grad:   8.2 mmHg MV Peak grad:  11.3 mmHg    TR Vmax:        143.00 cm/s MV Mean grad:  5.0 mmHg  MV Vmax:       1.68 m/s     SHUNTS MV Vmean:      93.3 cm/s    Systemic VTI:  0.20 m MV Decel Time: 269 msec     Systemic Diam: 2.00 cm MV E velocity: 148.00 cm/s MV A velocity: 67.90 cm/s MV E/A ratio:  2.18 Serafina Royals MD Electronically signed by Serafina Royals MD Signature Date/Time: 03/23/2019/12:28:08 PM    Final      TELEMETRY: Paroxysmal atrial fibrillation with a heart rate of 76 bpm  ASSESSMENT AND PLAN:  Principal Problem:   CVA (cerebral vascular accident) ([place]) Active Problems:   Acquired hypothyroidism   Essential hypertension   UTI (urinary tract infection)   Alzheimer's dementia ([facility])   AF (paroxysmal atrial fibrillation) ([facility])   Acute CVA (cerebrovascular accident) ([place])  Anemia due to stage 4 chronic kidney disease ([facility])   Altered mental status    PLAN Paroxysmal atrial fibrillation, rate controlled, reasonably stable  -Agree with continuous cardiac monitoring  -Continue 5 mg of Eliquis by mouth twice daily.  -Please ensure that patient is taking her Eliquis accurately at home  Echocardiogram reveals mildly decreased LV function with an EF of 45 to 50%, possible thrombus not noted on echo  -Consider TEE for further evaluation of possible thrombus  -Consider diuretics if BLE edema worsens   UTI, reasonably stable  -Agree with antibiotic therapy for urinary tract infection  CVA, reasonably stable   -Agree with neurology plan of care and input appreciated  -Continue aspirin therapy along with Eliquis  -Recommend PT/OT evaluation  Hypertension, not-well controlled  -Recommend tight blood pressure control  -Continue amlodipine 10 mg daily  -Continue benazepril to 10 mg daily  -We will add HCTZ 12.5mg    Hyperlipidemia, reasonably stable  -Continue high-dose statin therapy with Lipitor    Daneka Lantigua, ACNPC-AG  03/24/2019 10:01 AM

## 2019-03-24 NOTE — Care Management Important Message (Signed)
Important Message  Patient Details  Name: Haley Brooks MRN: [id number] Date of Birth: [birth year]   Medicare Important Message Given:  Yes     Shelbie Ammons, RN 03/24/2019, 9:52 AM

## 2019-03-24 NOTE — TOC Transition Note (Signed)
Transition of Care [facility]) - CM/SW Discharge Note   Patient Details  Name: Haley Brooks MRN: [id number] Date of Birth: [birth year]  [facility] of [facility]) CM/SW Contact:  Shelbie Hutching, RN Phone Number: 03/24/2019, 2:18 PM   Clinical Narrative:    Patient is medically stable for discharge to [facility].  Patient will go to room 808, bedside RN will call report to [phone].  This RNCM called [facility] to arrange transport.  Patient is 9th patient on the list for transport with EMS. Patient's daughter Mateo Flow has been notified of discharge.    Final next level of care: Skilled Nursing Facility Barriers to Discharge: Barriers Resolved   Patient Goals and CMS Choice Patient states their goals for this hospitalization and ongoing recovery are:: ro go home and gain strength CMS Medicare.gov Compare Post Acute Care list provided to:: Patient Represenative (must comment) Choice offered to / list presented to : Adult Children([place])  Discharge Placement   Existing PASRR number confirmed : 03/24/19          Patient chooses bed at: [facility] [place] Patient to be transferred to facility by: [facility] Name of family member notified: Sherre Scarlet- daughter Patient and family notified of of transfer: 03/24/19  Discharge Plan and Services     Post Acute Care Choice: Home Health                    HH Arranged: PT, OT, Nurse's Aide [place] Agency: [place] ([place]) Date [facility] Agency Contacted: 03/23/19 Time HH Agency Contacted: 1000 Representative spoke with at [place]: [place] ([place]) Interventions     Readmission Risk Interventions No flowsheet data found.

## 2019-05-27 ENCOUNTER — Other Ambulatory Visit: Payer: Self-pay

## 2019-05-27 ENCOUNTER — Other Ambulatory Visit: Payer: Self-pay | Admitting: Internal Medicine

## 2019-05-27 ENCOUNTER — Ambulatory Visit
Admission: RE | Admit: 2019-05-27 | Discharge: 2019-05-27 | Disposition: A | Discharge status: Home / Self Care | Payer: Medicare Other | Source: Ambulatory Visit | Attending: Internal Medicine | Admitting: Internal Medicine

## 2019-05-27 DIAGNOSIS — I639 Cerebral infarction, unspecified: Secondary | ICD-10-CM | POA: Insufficient documentation

## 2019-05-27 DIAGNOSIS — I369 Nonrheumatic tricuspid valve disorder, unspecified: Secondary | ICD-10-CM

## 2019-07-07 ENCOUNTER — Inpatient Hospital Stay: Payer: Medicare Other

## 2019-07-07 ENCOUNTER — Other Ambulatory Visit: Payer: Self-pay

## 2019-07-07 ENCOUNTER — Inpatient Hospital Stay
Admission: EM | Admit: 2019-07-07 | Discharge: 2019-07-13 | DRG: 682 | Disposition: A | Discharge status: Hospice | Payer: Medicare Other | Attending: Internal Medicine | Admitting: Internal Medicine

## 2019-07-07 ENCOUNTER — Encounter: Payer: Self-pay | Admitting: Emergency Medicine

## 2019-07-07 DIAGNOSIS — I69354 Hemiplegia and hemiparesis following cerebral infarction affecting left non-dominant side: Secondary | ICD-10-CM

## 2019-07-07 DIAGNOSIS — R630 Anorexia: Secondary | ICD-10-CM | POA: Diagnosis present

## 2019-07-07 DIAGNOSIS — R29810 Facial weakness: Secondary | ICD-10-CM | POA: Diagnosis not present

## 2019-07-07 DIAGNOSIS — I639 Cerebral infarction, unspecified: Secondary | ICD-10-CM | POA: Diagnosis not present

## 2019-07-07 DIAGNOSIS — F0280 Dementia in other diseases classified elsewhere without behavioral disturbance: Secondary | ICD-10-CM | POA: Diagnosis present

## 2019-07-07 DIAGNOSIS — Z20822 Contact with and (suspected) exposure to covid-19: Secondary | ICD-10-CM | POA: Diagnosis present

## 2019-07-07 DIAGNOSIS — Z66 Do not resuscitate: Secondary | ICD-10-CM | POA: Diagnosis present

## 2019-07-07 DIAGNOSIS — K921 Melena: Secondary | ICD-10-CM | POA: Diagnosis not present

## 2019-07-07 DIAGNOSIS — R471 Dysarthria and anarthria: Secondary | ICD-10-CM | POA: Diagnosis not present

## 2019-07-07 DIAGNOSIS — I6340 Cerebral infarction due to embolism of unspecified cerebral artery: Secondary | ICD-10-CM | POA: Diagnosis not present

## 2019-07-07 DIAGNOSIS — N184 Chronic kidney disease, stage 4 (severe): Secondary | ICD-10-CM | POA: Diagnosis present

## 2019-07-07 DIAGNOSIS — Z9049 Acquired absence of other specified parts of digestive tract: Secondary | ICD-10-CM

## 2019-07-07 DIAGNOSIS — D259 Leiomyoma of uterus, unspecified: Secondary | ICD-10-CM | POA: Diagnosis present

## 2019-07-07 DIAGNOSIS — D631 Anemia in chronic kidney disease: Secondary | ICD-10-CM | POA: Diagnosis present

## 2019-07-07 DIAGNOSIS — Z85038 Personal history of other malignant neoplasm of large intestine: Secondary | ICD-10-CM | POA: Diagnosis present

## 2019-07-07 DIAGNOSIS — Z823 Family history of stroke: Secondary | ICD-10-CM

## 2019-07-07 DIAGNOSIS — Z800 Family history of malignant neoplasm of digestive organs: Secondary | ICD-10-CM

## 2019-07-07 DIAGNOSIS — N179 Acute kidney failure, unspecified: Secondary | ICD-10-CM | POA: Diagnosis present

## 2019-07-07 DIAGNOSIS — K5900 Constipation, unspecified: Secondary | ICD-10-CM | POA: Diagnosis present

## 2019-07-07 DIAGNOSIS — Z9221 Personal history of antineoplastic chemotherapy: Secondary | ICD-10-CM

## 2019-07-07 DIAGNOSIS — E038 Other specified hypothyroidism: Secondary | ICD-10-CM | POA: Diagnosis present

## 2019-07-07 DIAGNOSIS — I129 Hypertensive chronic kidney disease with stage 1 through stage 4 chronic kidney disease, or unspecified chronic kidney disease: Secondary | ICD-10-CM | POA: Diagnosis present

## 2019-07-07 DIAGNOSIS — R634 Abnormal weight loss: Secondary | ICD-10-CM | POA: Diagnosis present

## 2019-07-07 DIAGNOSIS — E872 Acidosis: Secondary | ICD-10-CM | POA: Diagnosis present

## 2019-07-07 DIAGNOSIS — G8321 Monoplegia of upper limb affecting right dominant side: Secondary | ICD-10-CM | POA: Diagnosis not present

## 2019-07-07 DIAGNOSIS — Z7189 Other specified counseling: Secondary | ICD-10-CM | POA: Diagnosis not present

## 2019-07-07 DIAGNOSIS — Z7982 Long term (current) use of aspirin: Secondary | ICD-10-CM

## 2019-07-07 DIAGNOSIS — R29720 NIHSS score 20: Secondary | ICD-10-CM | POA: Diagnosis not present

## 2019-07-07 DIAGNOSIS — I480 Paroxysmal atrial fibrillation: Secondary | ICD-10-CM | POA: Diagnosis present

## 2019-07-07 DIAGNOSIS — G309 Alzheimer's disease, unspecified: Secondary | ICD-10-CM | POA: Diagnosis present

## 2019-07-07 DIAGNOSIS — E162 Hypoglycemia, unspecified: Secondary | ICD-10-CM | POA: Diagnosis present

## 2019-07-07 DIAGNOSIS — D62 Acute posthemorrhagic anemia: Secondary | ICD-10-CM | POA: Diagnosis present

## 2019-07-07 DIAGNOSIS — Z87440 Personal history of urinary (tract) infections: Secondary | ICD-10-CM

## 2019-07-07 DIAGNOSIS — E782 Mixed hyperlipidemia: Secondary | ICD-10-CM | POA: Diagnosis present

## 2019-07-07 DIAGNOSIS — Z79890 Hormone replacement therapy (postmenopausal): Secondary | ICD-10-CM

## 2019-07-07 DIAGNOSIS — I10 Essential (primary) hypertension: Secondary | ICD-10-CM | POA: Diagnosis present

## 2019-07-07 DIAGNOSIS — Z79899 Other long term (current) drug therapy: Secondary | ICD-10-CM

## 2019-07-07 DIAGNOSIS — N281 Cyst of kidney, acquired: Secondary | ICD-10-CM | POA: Diagnosis present

## 2019-07-07 DIAGNOSIS — Z7901 Long term (current) use of anticoagulants: Secondary | ICD-10-CM

## 2019-07-07 DIAGNOSIS — Z515 Encounter for palliative care: Secondary | ICD-10-CM

## 2019-07-07 DIAGNOSIS — L8990 Pressure ulcer of unspecified site, unspecified stage: Secondary | ICD-10-CM | POA: Insufficient documentation

## 2019-07-07 DIAGNOSIS — L89152 Pressure ulcer of sacral region, stage 2: Secondary | ICD-10-CM | POA: Diagnosis present

## 2019-07-07 DIAGNOSIS — Z7983 Long term (current) use of bisphosphonates: Secondary | ICD-10-CM

## 2019-07-07 LAB — CBC
HCT: 36.6 % (ref 36.0–46.0)
Hemoglobin: 11.8 g/dL — ABNORMAL LOW (ref 12.0–15.0)
MCH: 29 pg (ref 26.0–34.0)
MCHC: 32.2 g/dL (ref 30.0–36.0)
MCV: 89.9 fL (ref 80.0–100.0)
Platelets: 316 10*3/uL (ref 150–400)
RBC: 4.07 MIL/uL (ref 3.87–5.11)
RDW: 14.6 % (ref 11.5–15.5)
WBC: 12.1 10*3/uL — ABNORMAL HIGH (ref 4.0–10.5)
nRBC: 0 % (ref 0.0–0.2)

## 2019-07-07 LAB — COMPREHENSIVE METABOLIC PANEL
ALT: 15 U/L (ref 0–44)
AST: 26 U/L (ref 15–41)
Albumin: 3.1 g/dL — ABNORMAL LOW (ref 3.5–5.0)
Alkaline Phosphatase: 68 U/L (ref 38–126)
Anion gap: 16 — ABNORMAL HIGH (ref 5–15)
BUN: 72 mg/dL — ABNORMAL HIGH (ref 8–23)
CO2: 17 mmol/L — ABNORMAL LOW (ref 22–32)
Calcium: 9.2 mg/dL (ref 8.9–10.3)
Chloride: 105 mmol/L (ref 98–111)
Creatinine, Ser: 2.93 mg/dL — ABNORMAL HIGH (ref 0.44–1.00)
GFR calc Af Amer: 16 mL/min — ABNORMAL LOW (ref >60)
GFR calc non Af Amer: 14 mL/min — ABNORMAL LOW (ref >60)
Glucose, Bld: 97 mg/dL (ref 70–99)
Potassium: 4.3 mmol/L (ref 3.5–5.1)
Sodium: 138 mmol/L (ref 135–145)
Total Bilirubin: 0.8 mg/dL (ref 0.3–1.2)
Total Protein: 7.4 g/dL (ref 6.5–8.1)

## 2019-07-07 LAB — TYPE AND SCREEN
ABO/RH(D): O POS
Antibody Screen: NEGATIVE

## 2019-07-07 LAB — HEMOGLOBIN AND HEMATOCRIT, BLOOD
HCT: 32.2 % — ABNORMAL LOW (ref 36.0–46.0)
Hemoglobin: 10.4 g/dL — ABNORMAL LOW (ref 12.0–15.0)

## 2019-07-07 LAB — APTT: aPTT: 32 seconds (ref 24–36)

## 2019-07-07 LAB — PROTIME-INR
INR: 1.4 — ABNORMAL HIGH (ref 0.8–1.2)
Prothrombin Time: 16.5 seconds — ABNORMAL HIGH (ref 11.4–15.2)

## 2019-07-07 LAB — SARS CORONAVIRUS 2 BY RT PCR (HOSPITAL ORDER, PERFORMED IN ~~LOC~~ HOSPITAL LAB): SARS Coronavirus 2: NEGATIVE

## 2019-07-07 MED ORDER — IOHEXOL 9 MG/ML PO SOLN
500.0000 mL | ORAL | Status: AC
  Administered 2019-07-07: 500 mL via ORAL

## 2019-07-07 MED ORDER — SODIUM CHLORIDE 0.45 % IV SOLN: INTRAVENOUS | Status: DC

## 2019-07-07 MED ORDER — METOPROLOL TARTRATE 5 MG/5ML IV SOLN: 5.0000 mg | Freq: Four times a day (QID) | INTRAVENOUS | Status: DC | PRN

## 2019-07-07 MED ORDER — PANTOPRAZOLE SODIUM 40 MG IV SOLR
40.0000 mg | Freq: Two times a day (BID) | INTRAVENOUS | Status: DC
  Administered 2019-07-08 – 2019-07-13 (×11): 40 mg via INTRAVENOUS
  Filled 2019-07-07 (×11): qty 40

## 2019-07-07 MED ORDER — DONEPEZIL HCL 5 MG PO TABS
10.0000 mg | ORAL_TABLET | Freq: Two times a day (BID) | ORAL | Status: DC
  Administered 2019-07-08 – 2019-07-10 (×5): 10 mg via ORAL
  Filled 2019-07-07 (×5): qty 2

## 2019-07-07 MED ORDER — ONDANSETRON HCL 4 MG/2ML IJ SOLN: 4.0000 mg | Freq: Four times a day (QID) | INTRAMUSCULAR | Status: DC | PRN

## 2019-07-07 MED ORDER — ACETAMINOPHEN 650 MG RE SUPP: 650.0000 mg | Freq: Four times a day (QID) | RECTAL | Status: DC | PRN

## 2019-07-07 MED ORDER — SODIUM CHLORIDE 0.9 % IV BOLUS
1000.0000 mL | Freq: Once | INTRAVENOUS | Status: AC
  Administered 2019-07-07: 1000 mL via INTRAVENOUS

## 2019-07-07 MED ORDER — ONDANSETRON HCL 4 MG PO TABS: 4.0000 mg | ORAL_TABLET | Freq: Four times a day (QID) | ORAL | Status: DC | PRN

## 2019-07-07 MED ORDER — PANTOPRAZOLE SODIUM 40 MG IV SOLR
40.0000 mg | Freq: Once | INTRAVENOUS | Status: AC
  Administered 2019-07-07: 40 mg via INTRAVENOUS
  Filled 2019-07-07: qty 40

## 2019-07-07 MED ORDER — MEMANTINE HCL 5 MG PO TABS
10.0000 mg | ORAL_TABLET | Freq: Two times a day (BID) | ORAL | Status: DC
  Administered 2019-07-08 – 2019-07-10 (×5): 10 mg via ORAL
  Filled 2019-07-07 (×5): qty 2

## 2019-07-07 MED ORDER — ACETAMINOPHEN 325 MG PO TABS: 650.0000 mg | ORAL_TABLET | Freq: Four times a day (QID) | ORAL | Status: DC | PRN

## 2019-07-07 MED ORDER — LEVOTHYROXINE SODIUM 50 MCG PO TABS
150.0000 ug | ORAL_TABLET | Freq: Every day | ORAL | Status: DC
  Administered 2019-07-09 – 2019-07-10 (×2): 150 ug via ORAL
  Filled 2019-07-07 (×2): qty 1

## 2019-07-07 NOTE — ED Triage Notes (Signed)
Pt from home with daughter, who reports that pt has had black, tarry stools x 3 days (possibly more). Pt has been at [facility] until last Thursday. Pt was c/o nausea and daughter gave her pepto, pt became constipated, and then was given a laxative. Daughter states that stools then became dark and tarry, as well as voluminous. Denies pain, but daughter states she is complaining of back pain. NAD noted.

## 2019-07-07 NOTE — ED Provider Notes (Signed)
[facility] Emergency Department Provider Note  ____________________________________________   First MD Initiated Contact with Patient 07/07/19 1530     (approximate)  I have reviewed the triage vital signs and the nursing notes.   HISTORY  Chief Complaint Melena  EM caveat: Patient poor historian, history comes from conversation over the phone with her daughter who brought her here  HPI Haley Brooks is a 86 y.o. female history of Alzheimer's disease, hypertension thyroid disease, previous stroke A. fib.   She was recently on a rehabilitation.  For the last 3 to 4 days she has not been eating or drinking and she has been having dark stools.  No vomiting.  No fevers.  Recently treated for urinary tract infection just finishing a course of antibiotic  Patient herself has no complaints.  Patient reports she feels "fine".  However she does appear generally fatigued  Past Medical History:  Diagnosis Date  . Cancer [facility])    colon cancer   . Hemorrhoid   . Hypertension   . Thyroid disease     Patient Active Problem List   Diagnosis Date Noted  . Altered mental status   . CVA (cerebral vascular accident) ([place]) 03/21/2019  . AF (paroxysmal atrial fibrillation) ([place]) 03/21/2019  . Acute CVA (cerebrovascular accident) ([place]) 03/21/2019  . Anemia due to stage 4 chronic kidney disease ([place]) 03/21/2019  . Acute delirium 02/21/2017  . Alzheimer's dementia ([place]) 02/21/2017  . Acute encephalopathy 02/18/2017  . Acute metabolic encephalopathy 39/76/7341  . UTI (urinary tract infection) 12/24/2016  . Hemorrhoid   . History of colon cancer 04/05/2015  . Chronic anemia 03/10/2015  . Osteopenia 12/11/2014  . SDAT (senile dementia of Alzheimer's type) ([place]) 06/18/2014  . Acquired hypothyroidism 03/01/2014  . Essential hypertension 03/01/2014  . Mixed hyperlipidemia 03/01/2014    Past Surgical History:  Procedure Laterality Date  . COLON SURGERY    .  FRACTURE SURGERY      Prior to Admission medications   Medication Sig Start Date End Date Taking? Authorizing Provider  alendronate (FOSAMAX) 70 MG tablet Take 1 tablet by mouth once a week.  03/25/15   [provider]  apixaban (ELIQUIS) 5 MG TABS tablet Take 5 mg by mouth 2 (two) times daily.    [provider]  aspirin 325 MG EC tablet Take 325 mg by mouth daily.     [provider]  atorvastatin (LIPITOR) 80 MG tablet Take 1 tablet (80 mg total) by mouth daily at 6 PM. 03/23/19   Nolberto Hanlon, MD  benazepril (LOTENSIN) 10 MG tablet Take 1 tablet (10 mg total) by mouth daily. 03/23/19   Nolberto Hanlon, MD  donepezil (ARICEPT) 10 MG tablet Take 1 tablet by mouth 2 (two) times daily. 12/21/16   [provider]  escitalopram (LEXAPRO) 10 MG tablet Take 10 mg by mouth daily. 01/16/19   [provider]  ferrous sulfate 325 (65 FE) MG tablet Take 325 mg by mouth 2 (two) times daily with a meal.    [provider]  levothyroxine (SYNTHROID) 150 MCG tablet Take 1 tablet by mouth daily in the afternoon. 12/31/18   [provider]  memantine (NAMENDA) 10 MG tablet Take 10 mg by mouth 2 (two) times daily. 12/31/18   [provider]  senna-docusate (SENOKOT-S) 8.6-50 MG tablet Take 2 tablets by mouth at bedtime as needed for mild constipation or moderate constipation. 03/23/19   Nolberto Hanlon, MD  senna-docusate (SENOKOT-S) 8.6-50 MG tablet  Take 2 tablets by mouth 2 (two) times daily. 03/24/19   Nolberto Hanlon, MD    Allergies Patient has no known allergies.  Family History  Problem Relation Age of Onset  . Colon cancer Mother   . Diabetes Father   . Cancer Brother     Social History Social History   Tobacco Use  . Smoking status: Never Smoker  . Smokeless tobacco: Never Used  Vaping Use  . Vaping Use: Never used  Substance Use Topics  . Alcohol use: No  . Drug use: No    Review of Systems  EM caveat  Patient spoke with  him.  Denies abdominal pain, chest pain, shortness of breath.  No headache.  Does report she feels little weak in her arms and legs both sides   ____________________________________________   PHYSICAL EXAM:  VITAL SIGNS: ED Triage Vitals  Enc Vitals Group     BP 07/07/19 1510 105/62     Pulse Rate 07/07/19 1510 (!) 120     Resp 07/07/19 1510 19     Temp 07/07/19 1510 98.9 F (37.2 C)     Temp Source 07/07/19 1510 Oral     SpO2 07/07/19 1510 96 %     Weight 07/07/19 1150 125 lb (56.7 kg)     Height 07/07/19 1150 5\' 4"  (1.626 m)     Head Circumference --      [facility] [facility] --      Pain Score 07/07/19 1150 0     Pain Loc --      Pain Edu? --      Excl. in [place]? --     Constitutional: Alert and oriented to self not to year or situation.  Is mildly ill but in no acute distress Eyes: Conjunctivae are normal. Head: Atraumatic. Nose: No congestion/rhinnorhea. Mouth/Throat: Mucous membranes are dry. Neck: No stridor.  Cardiovascular: Slightly tachycardic, irregular grossly normal heart sounds.  Good peripheral circulation. Respiratory: Normal respiratory effort.  No retractions. Lungs CTAB. Gastrointestinal: Soft and nontender. No distention.  No rebound guarding or discomfort any quadrant.  Rectal exam performed with nurse Deneise Lever, fairly well-formed but dark and heme positive stool is present.  Control positive. Musculoskeletal: No lower extremity tenderness nor edema. Neurologic:  Normal speech and language. No gross focal neurologic deficits are appreciated.  Generalized fatigue and weakness in all extremities. Skin:  Skin is warm, dry and intact. No rash noted. Psychiatric: Mood and affect are normal. Speech and behavior are normal.  ____________________________________________   LABS (all labs ordered are listed, but only abnormal results are displayed)  Labs Reviewed  COMPREHENSIVE METABOLIC PANEL - Abnormal; Notable for the following components:      Result Value   CO2 17 (*)     BUN 72 (*)    Creatinine, Ser 2.93 (*)    Albumin 3.1 (*)    GFR calc non Af Amer 14 (*)    GFR calc Af Amer 16 (*)    Anion gap 16 (*)    All other components within normal limits  CBC - Abnormal; Notable for the following components:   WBC 12.1 (*)    Hemoglobin 11.8 (*)    All other components within normal limits  SARS CORONAVIRUS 2 BY RT PCR (HOSPITAL ORDER, [facility])  GASTROINTESTINAL PANEL BY PCR, STOOL (REPLACES STOOL CULTURE)  C DIFFICILE QUICK SCREEN W PCR REFLEX  PROTIME-INR  APTT  URINALYSIS, COMPLETE (UACMP) WITH MICROSCOPIC  POC OCCULT BLOOD, ED  TYPE AND SCREEN   ____________________________________________  EKG  Reviewed inter by me at 1550 Heart rate 105 QRS 80 QTc 470 Atrial fibrillation RVR, repolarization abnormalities likely secondary to LVH.  Compared with previous EKG does not appear significant ST changes are present ____________________________________________  RADIOLOGY   ____________________________________________   PROCEDURES  Procedure(s) performed: None  Procedures  Critical Care performed: No  ____________________________________________   INITIAL IMPRESSION / ASSESSMENT AND PLAN / ED COURSE  Pertinent labs & imaging results that were available during my care of the patient were reviewed by me and considered in my medical decision making (see chart for details).   Patient noted to have acute kidney injury.  Recently treated for urinary tract infection.  Care everywhere currently not crossing over documents from [facility] system, unclear what antibiotic she was on at this time.  Discussed with the daughter,.  She has been having loose dark stool fatigue and increasing weakness.  She is minimally hypotensive to normotensive, no acute distress.  Will provide IV fluid hydration.  Acute kidney injury noted.  Check urinalysis for evidence of ongoing infection.  Mild leukocytosis.  Reassuring abdominal exam  without any evidence of discomfort pain or distention to suggest acute intra-abdominal process.  Does not currently meet transfusion criteria, currently off of anticoagulation per daughter.  Will admit to hospital for further care and management, discussed with hospitalist Dr. Maryland Pink. Discussed UA ordered (pending) with hospitalist as well.  Clinical Course as of Jul 06 1621  Tue Jul 07, 2019  1552 Patient is OFF Eliquis for a few months and is not on a blood thinner now per daughter.    [MQ]    Clinical Course User Index [MQ] Delman Kitten, MD    Patient's daughter understanding agreeable with plan for admission for further evaluation and treatment ____________________________________________   FINAL CLINICAL IMPRESSION(S) / ED DIAGNOSES  Final diagnoses:  Black tarry stools  AKI (acute kidney injury) [facility])        Note:  This document was prepared using Dragon voice recognition software and may include unintentional dictation errors       Delman Kitten, MD 07/07/19 2333

## 2019-07-07 NOTE — H&P (Signed)
[facility] History and Physical  Haley Brooks LOV:[id number] DOB: [birth year] DOA: 07/07/2019   PCP: Kirk Ruths, MD  Specialists: Previously seen by gastroenterology with [facility]  Chief Complaint: Weakness, poor appetite, black-colored stool  HPI: Haley Brooks is a 86 y.o. female with a past medical history of colon cancer, Alzheimer's dementia, essential hypertension, history of depression, history of stroke in February of this year, who was living in a skilled nursing facility till past week and came back home this past Thursday.  She has been cared for at home by her daughter.  Most of the history was provided by the daughter.  Patient is a very poor historian due to her dementia.  According to the daughter patient was complaining of some nausea on Thursday and Friday.  For the nausea the daughter gave her medications similar to Pepto-Bismol although she is not certain.  It was recommended by a pharmacist at [facility].  Subsequently daughter noticed the patient was not having any bowel movements.  So she gave her a laxative which was followed by onset of multiple loose stools.  The stools were black in color.  Patient did not have any vomiting.  No fever or chills.  Patient denies any pain currently. Apparently she was experiencing extremely poor oral intake for past several weeks.  Apparently this has been the case even at the skilled nursing facility.  She was started on appetite stimulant while she was there.    In the emergency department stool was tested and was positive for occult blood.  It was black in color as per the ED provider.  Hemoglobin was noted to be stable however.  She was found to have acute kidney injury.  Patient's daughter also mentioned that patient had lost about 22 pounds in the last 3 to 4 months.  Patient denies any difficulty swallowing.  According to the daughter she is able to swallow her tablets and other medications without  difficulties.  She just does not want to eat any food.  Home Medications: Prior to Admission medications   Medication Sig Start Date End Date Taking? Authorizing Provider  alendronate (FOSAMAX) 70 MG tablet Take 1 tablet by mouth once a week.  03/25/15   [provider]  apixaban (ELIQUIS) 5 MG TABS tablet Take 5 mg by mouth 2 (two) times daily.    [provider]  aspirin 325 MG EC tablet Take 325 mg by mouth daily.     [provider]  atorvastatin (LIPITOR) 80 MG tablet Take 1 tablet (80 mg total) by mouth daily at 6 PM. 03/23/19   Nolberto Hanlon, MD  benazepril (LOTENSIN) 10 MG tablet Take 1 tablet (10 mg total) by mouth daily. 03/23/19   Nolberto Hanlon, MD  donepezil (ARICEPT) 10 MG tablet Take 1 tablet by mouth 2 (two) times daily. 12/21/16   [provider]  escitalopram (LEXAPRO) 10 MG tablet Take 10 mg by mouth daily. 01/16/19   [provider]  ferrous sulfate 325 (65 FE) MG tablet Take 325 mg by mouth 2 (two) times daily with a meal.    [provider]  levothyroxine (SYNTHROID) 150 MCG tablet Take 1 tablet by mouth daily in the afternoon. 12/31/18   [provider]  memantine (NAMENDA) 10 MG tablet Take 10 mg by mouth 2 (two) times daily. 12/31/18   [provider]  senna-docusate (SENOKOT-S) 8.6-50 MG tablet Take 2 tablets by mouth at bedtime as needed for mild constipation or moderate  constipation. 03/23/19   Nolberto Hanlon, MD  senna-docusate (SENOKOT-S) 8.6-50 MG tablet Take 2 tablets by mouth 2 (two) times daily. 03/24/19   Nolberto Hanlon, MD    Allergies: No Known Allergies  Past Medical History: Past Medical History:  Diagnosis Date  . Cancer [facility])    colon cancer   . Hemorrhoid   . Hypertension   . Thyroid disease     Past Surgical History:  Procedure Laterality Date  . COLON SURGERY    . FRACTURE SURGERY      Social History: Currently living at home with her daughter caring for her at home.  She was  released from [facility] nursing facility just week ago.  No history of alcohol use.  No history of smoking.  Family History:  Family History  Problem Relation Age of Onset  . Colon cancer Mother   . Diabetes Father   . Cancer Brother      Review of Systems -unable to do due to her dementia  Physical Examination  Vitals:   07/07/19 1150 07/07/19 1510  BP:  105/62  Pulse:  (!) 120  Resp:  19  Temp:  98.9 F (37.2 C)  TempSrc:  Oral  SpO2:  96%  Weight: 56.7 kg   Height: 5\' 4"  (1.626 m)     BP 105/62   Pulse (!) 120   Temp 98.9 F (37.2 C) (Oral)   Resp 19   Ht 5\' 4"  (1.626 m)   Wt 56.7 kg   SpO2 96%   BMI 21.46 kg/m   General appearance: alert, cooperative, distracted and no distress Head: Normocephalic, without obvious abnormality, atraumatic Eyes: conjunctivae/corneas clear. PERRL, EOM's intact.  Throat: lips, mucosa, and tongue normal; teeth and gums normal Neck: no adenopathy, no carotid bruit, no JVD, supple, symmetrical, trachea midline and thyroid not enlarged, symmetric, no tenderness/mass/nodules Resp: clear to auscultation bilaterally Cardio: regular rate and rhythm, S1, S2 normal, no murmur, click, rub or gallop GI: Abdomen is soft.  Mildly tender in the epigastric area without any rebound rigidity or guarding.  No obvious masses organomegaly appreciated. Extremities: extremities normal, atraumatic, no cyanosis or edema Pulses: 2+ and symmetric Skin: Skin color, texture, turgor normal. No rashes or lesions Lymph nodes: Cervical, supraclavicular, and axillary nodes normal. Neurologic: Alert.  Disoriented.  No obvious focal neurological deficits.  Per daughter she does have subtle left-sided weakness at times.    Labs on Admission: I have personally reviewed following labs and imaging studies  CBC: Recent Labs  Lab 07/07/19 1201  WBC 12.1*  HGB 11.8*  HCT 36.6  MCV 89.9  PLT 409   Basic Metabolic Panel: Recent Labs  Lab 07/07/19 1201    NA 138  K 4.3  CL 105  CO2 17*  GLUCOSE 97  BUN 72*  CREATININE 2.93*  CALCIUM 9.2   GFR: Estimated Creatinine Clearance: 12.1 mL/min (A) (by C-G formula based on SCr of 2.93 mg/dL (H)). Liver Function Tests: Recent Labs  Lab 07/07/19 1201  AST 26  ALT 15  ALKPHOS 68  BILITOT 0.8  PROT 7.4  ALBUMIN 3.1*   Coagulation Profile: Recent Labs  Lab 07/07/19 1554  INR 1.4*     Radiological Exams on Admission: No results found.  My interpretation of Electrocardiogram: Atrial fibrillation 106 bpm.  T wave inversion in 1 aVL.  LVH criteria noted.  No concerning ischemic changes.   Problem List  Principal Problem:   AKI (acute kidney injury) ([place]) Active Problems:  Essential hypertension   History of colon cancer   Alzheimer's dementia ([place])   CVA (cerebral vascular accident) ([place])   AF (paroxysmal atrial fibrillation) ([place])   Anemia due to stage 4 chronic kidney disease ([place])   Weight loss   Melena   Assessment: This is a 86 year old African-American female with past medical history as stated earlier who is brought in by her daughter due to concern for black-colored stool at home.  There is also concern because patient has not been eating or drinking and has lost a lot of weight in the last 3 to 4 months. Found to have AKI.  Plan:  1. Acute kidney injury with metabolic acidosis: Most likely due to poor oral intake.  Her baseline creatinine seems to be around 1.3.  Came in with a creatinine of 2.93 with elevated BUN of 72.  She will be given IV fluids.  Monitor urine output.  Follow-up on the CT scan of the abdomen pelvis.  Follow-up on UA.  2.  Melanotic stool: Unclear if she had true melena or not.  If she did take medications similar to Pepto-Bismol, Pepto-Bismol can cause black discoloration of stool.  Patient has also been having a lot of loose stools after she was given laxative.  It is quite possible she was constipated.  Stool studies have been ordered by the  ED provider.  We will place her on PPI.  Recheck her hemoglobin later tonight and tomorrow.  Patient has had poor oral intake.  Even though there is no history of dysphagia it may be necessary to rule out pathology in the upper GI tract.  In view of this we have consulted gastroenterology.  3.  History of paroxysmal atrial fibrillation: It appears that patient was on Eliquis.  The daughter mentions that it was discontinued recently by providers in the skilled nursing facility.  This will need to be verified.  We will hold it for now in any case due to concern for GI bleed.  4.  History of colon cancer: He has had colectomy in 2005.  Status post chemotherapy previously as well.  Daughter does not know when the last colonoscopy was done.  5. Alzheimer's dementia: Noted to be on Aricept and Namenda.  6.  Anorexia and weight loss: See above.  Could be due to her dementia but will need to rule out organic issues.  Will do a CT scan of the abdomen pelvis although it has to be done without IV contrast.  7. History of embolic stroke in February 2021: Does not seem to have any significant deficits.  Continue to monitor.  Will need to determine as to her anticoagulation status.  DVT Prophylaxis: SCD Code Status: Full code.  This was discussed in detail with the daughter. Family Communication: Discussed with the daughter Disposition: She was recently in a skilled nursing facility and was sent home last week.  Will get PT and OT evaluation. Consults called: Gastroenterology Admission Status: Status is: Inpatient  Remains inpatient appropriate because:Ongoing diagnostic testing needed not appropriate for outpatient work up and IV treatments appropriate due to intensity of illness or inability to take PO   Dispo: The patient is from: Home              Anticipated d/c is to: To be determined              Anticipated d/c date is: 3 days              Patient  currently is not medically stable to  d/c.      Severity of Illness: The appropriate patient status for this patient is INPATIENT. Inpatient status is judged to be reasonable and necessary in order to provide the required intensity of service to ensure the patient's safety. The patient's presenting symptoms, physical exam findings, and initial radiographic and laboratory data in the context of their chronic comorbidities is felt to place them at high risk for further clinical deterioration. Furthermore, it is not anticipated that the patient will be medically stable for discharge from the hospital within 2 midnights of admission. The following factors support the patient status of inpatient.   " The patient's presenting symptoms include poor oral intake, nausea. " The worrisome physical exam findings include dehydration. " The initial radiographic and laboratory data are worrisome because of acute kidney injury. " The chronic co-morbidities include dementia.   * I certify that at the point of admission it is my clinical judgment that the patient will require inpatient hospital care spanning beyond 2 midnights from the point of admission due to high intensity of service, high risk for further deterioration and high frequency of surveillance required.*    Further management decisions will depend on results of further testing and patient's response to treatment.  Suleiman Finigan [employer]  [facility] operational officer on [email or web address]  07/07/2019, 4:41 PM

## 2019-07-07 NOTE — ED Notes (Signed)
This RN attempted to call report. Receiving RN informed to call back in 10 minutes

## 2019-07-08 ENCOUNTER — Inpatient Hospital Stay: Payer: Medicare Other

## 2019-07-08 DIAGNOSIS — D62 Acute posthemorrhagic anemia: Secondary | ICD-10-CM

## 2019-07-08 LAB — GLUCOSE, CAPILLARY
Glucose-Capillary: 42 mg/dL — CL (ref 70–99)
Glucose-Capillary: 174 mg/dL — ABNORMAL HIGH (ref 70–99)
Glucose-Capillary: 113 mg/dL — ABNORMAL HIGH (ref 70–99)
Glucose-Capillary: 93 mg/dL (ref 70–99)

## 2019-07-08 LAB — COMPREHENSIVE METABOLIC PANEL
ALT: 13 U/L (ref 0–44)
AST: 23 U/L (ref 15–41)
Albumin: 2.5 g/dL — ABNORMAL LOW (ref 3.5–5.0)
Alkaline Phosphatase: 57 U/L (ref 38–126)
Anion gap: 11 (ref 5–15)
BUN: 74 mg/dL — ABNORMAL HIGH (ref 8–23)
CO2: 18 mmol/L — ABNORMAL LOW (ref 22–32)
Calcium: 8.5 mg/dL — ABNORMAL LOW (ref 8.9–10.3)
Chloride: 106 mmol/L (ref 98–111)
Creatinine, Ser: 2.87 mg/dL — ABNORMAL HIGH (ref 0.44–1.00)
GFR calc Af Amer: 17 mL/min — ABNORMAL LOW (ref >60)
GFR calc non Af Amer: 14 mL/min — ABNORMAL LOW (ref >60)
Glucose, Bld: 58 mg/dL — ABNORMAL LOW (ref 70–99)
Potassium: 4.4 mmol/L (ref 3.5–5.1)
Sodium: 135 mmol/L (ref 135–145)
Total Bilirubin: 0.9 mg/dL (ref 0.3–1.2)
Total Protein: 6.2 g/dL — ABNORMAL LOW (ref 6.5–8.1)

## 2019-07-08 LAB — CBC
HCT: 30.3 % — ABNORMAL LOW (ref 36.0–46.0)
Hemoglobin: 9.8 g/dL — ABNORMAL LOW (ref 12.0–15.0)
MCH: 29 pg (ref 26.0–34.0)
MCHC: 32.3 g/dL (ref 30.0–36.0)
MCV: 89.6 fL (ref 80.0–100.0)
Platelets: 226 10*3/uL (ref 150–400)
RBC: 3.38 MIL/uL — ABNORMAL LOW (ref 3.87–5.11)
RDW: 14.6 % (ref 11.5–15.5)
WBC: 15.6 10*3/uL — ABNORMAL HIGH (ref 4.0–10.5)
nRBC: 0 % (ref 0.0–0.2)

## 2019-07-08 LAB — PROTIME-INR
INR: 1.4 — ABNORMAL HIGH (ref 0.8–1.2)
Prothrombin Time: 16.9 seconds — ABNORMAL HIGH (ref 11.4–15.2)

## 2019-07-08 MED ORDER — DEXTROSE 50 % IV SOLN
INTRAVENOUS | Status: AC
  Filled 2019-07-08: qty 50

## 2019-07-08 MED ORDER — STROKE: EARLY STAGES OF RECOVERY BOOK: Freq: Once | Status: AC

## 2019-07-08 MED ORDER — DEXTROSE 50 % IV SOLN
1.0000 | Freq: Once | INTRAVENOUS | Status: AC
  Administered 2019-07-08: 09:00:00 50 mL via INTRAVENOUS

## 2019-07-08 MED ORDER — DEXTROSE-NACL 5-0.45 % IV SOLN: INTRAVENOUS | Status: DC

## 2019-07-08 NOTE — Consult Note (Signed)
[facility] Tele[facility]urology Consult Services   Date of Service:   07/08/2019 16:18:02  Impression:     .  R53.1 - Weakness     .  R41.82 - AMS (Altered Mental Status)  Comments/Sign-Out: 86 y/o woman with h/o HTN, dementia, atrial fibrillation, stroke (residual left sided weakness) who was admitted for acute renal failure, GI bleeding and concern for underlying malignancy. Now drowsy and generally weak.  Metrics: Last Known Well: Unknown [facility] Notification Time: 07/08/2019 16:15:29 Stamp Time: 07/08/2019 16:18:02 Time First Login Attempt: 07/08/2019 16:20:00 Symptoms: generalized weakness NIHSS Start Assessment Time: 07/08/2019 16:36:59 Patient is not a candidate for Alteplase/Activase. Alteplase Medical Decision: 07/08/2019 16:34:21 Patient was not deemed candidate for Alteplase/Activase thrombolytics because of following reasons: Last Well Known Above 4.5 Hours. GI Bleeding (Within 21 Days). Coagulopathy.  CT head was reviewed.  Advanced Imaging: hospitalist declined obtaining CTA due to poor renal function   Our recommendations are outlined below.  Recommendations:     .  Activate Stroke Protocol Admission/Order Set     .  Stroke/Telemetry Floor     .  Neuro Checks     .  Bedside Swallow Eval     .  DVT Prophylaxis     .  IV Fluids, Normal Saline     .  Head of Bed 30 Degrees     .  Euglycemia and Avoid Hyperthermia (PRN Acetaminophen)     .  Hold Antithrombotics for Now due to GI bleeding  Routine Consultation with [facility] for Follow up Care  Sign Out:     .  Discussed with Primary Attending    ------------------------------------------------------------------------------  History of Present Illness: Patient is a 86 year old Female.  Inpatient stroke alert was called for symptoms of generalized weakness  86 y/o woman with h/o HTN, dementia, atrial fibrillation, stroke (residual left sided weakness) who was admitted  for acute renal failure, GI bleeding and concern for underlying malignancy. CT brain reviewed and case discussed with hospitalist. Patient was found to be hypoglycemic this morning. Last known well was last night as Dr. Maryland Pink (hospitalist). I recommended CTA, but Dr. Maryland Pink wants to avoid contrast given her poor renal function. Patient is drowsy and generally weak. Daughter at bedside. All questions answered.       Examination: BP(131/51), Pulse(65), Blood Glucose(113) 1A: Level of Consciousness - Arouses to minor stimulation + 1 1B: Ask Month and Age - 1 Question Right + 1 1C: Blink Eyes & Squeeze Hands - Performs 0 Tasks + 2 2: Test Horizontal Extraocular Movements - Normal + 0 3: Test Visual Fields - No Visual Loss + 0 4: Test Facial Palsy (Use Grimace if Obtunded) - Minor paralysis (flat nasolabial fold, smile asymmetry) + 1 5A: Test Left Arm Motor Drift - No Effort Against Gravity + 3 5B: Test Right Arm Motor Drift - No Effort Against Gravity + 3 6A: Test Left Leg Motor Drift - No Effort Against Gravity + 3 6B: Test Right Leg Motor Drift - No Effort Against Gravity + 3 7: Test Limb Ataxia (FNF/Heel-Shin) - Does Not Understand + 0 8: Test Sensation - Complete Loss: Cannot Sense Being Touched At All + 2 9: Test Language/Aphasia - Normal; No aphasia + 0 10: Test Dysarthria - Mild-Moderate Dysarthria: Slurring but can be understood + 1 11: Test Extinction/Inattention - No abnormality + 0  NIHSS Score: 20  Pre-Morbid Modified Ranking Scale: Unable to assess   Patient/Family was informed the Neurology Consult would occur via  TeleHealth consult by way of interactive audio and video telecommunications and consented to receiving care in this manner.   Patient is being evaluated for possible acute neurologic impairment and high probability of imminent or life-threatening deterioration. I spent total of 40 minutes providing care to this patient, including time for face to face visit  via telemedicine, review of medical records, imaging studies and discussion of findings with providers, the patient and/or family.   Dr Meryl Crutch   [facility] [phone]  Case [id number]

## 2019-07-08 NOTE — Evaluation (Signed)
Physical Therapy Evaluation Patient Details Name: Haley Brooks MRN: [id number] DOB: 03/07/1931 Today's Date: 07/08/2019   History of Present Illness  [name] is an 86 y.o. female with PMH of colon canzer, Alzheimer's dementia, HTN, depression, and prior stroke (February 2021) with subsequent STR stay. Pt had discharged home from STR earlier in the week. She presents to [facility] ED with c/o of black stools. MD assessment includes: AKI, melanotic stool, acute blood loss anemia, anorexia, hypoglycemia, afib, and chronic compression fracture deformities of the T11, L4 and L5 vertebral bodies.  Clinical Impression  Patient presents today with significant cognitive deficits and only able to follow simple commands. Patient remained pleasant throughout session. Pt demonstrated significant RUE weakness and flaccidity that was not present a few days ago (per daughter). MD/nursing was notified of this observation. Pt demonstrated bilateral knee extension motion, but was unable to follow commands for further LE assessment. Pt just returned home last week with her daughter s/p 46mo in SNF. Pt was independent and ambulatory prior to her stay at the SNF. Currently, pt requires max-total assist for bed mobility and daughter notes that since her return home, pt has been dependent in ADLs/IADLS. Pt will benefit from a trial of HHPT services upon discharge to safely address deficits listed in patient problem list for decreased caregiver assistance and eventual return to PLOF.     Follow Up Recommendations Home health PT;Supervision/Assistance - 24 hour    Equipment Recommendations  Other (comment) (possible Hoyer lift)    Recommendations for Other Services       Precautions / Restrictions Precautions Precautions: Fall Precaution Comments: high fall Restrictions Weight Bearing Restrictions: No      Mobility  Bed Mobility Overal bed mobility: Needs Assistance Bed Mobility: Supine to Sit;Sit to  Supine     Supine to sit: Max assist;+2 for physical assistance Sit to supine: Max assist;+2 for physical assistance      Transfers                 General transfer comment: Deferred. Pt unsafe/unable.  Ambulation/Gait                Stairs            Wheelchair Mobility    Modified Rankin (Stroke Patients Only)       Balance Overall balance assessment: Needs assistance Sitting-balance support: Feet supported;Single extremity supported Sitting balance-Leahy Scale: Poor Sitting balance - Comments: Pt requires at least MIN A to maintain seated balance at EOB. Significant L lateral lean noted. Postural control: Left lateral lean     Standing balance comment: Deferred.                             Pertinent Vitals/Pain Pain Assessment: No/denies pain    Home Living Family/patient expects to be discharged to:: Private residence Living Arrangements: Children Available Help at Discharge: Family;Available 24 hours/day Type of Home: House Home Access: Stairs to enter Entrance Stairs-Rails: Right Entrance Stairs-Number of Steps: 3 Home Layout: One level Home Equipment: Walker - 2 wheels;Cane - single point;Shower seat - built in;Grab bars - toilet;Grab bars - tub/shower;Hand held shower head;Wheelchair - manual      Prior Function Level of Independence: Needs assistance   Gait / Transfers Assistance Needed: Per dtr, pt has been dependent for transfers since returning home from STR. Family is using a transport WC to move pt around the home.  ADL's / Homemaking  Assistance Needed: Per dtr, pt is dependent for all ADL including bathing, dressing, and grooming. She is able to feed herself, but has limited apetite. Dtr states she usually feeds her.  Comments: Prior to initial admission ~3 months prior to this admission, pt was MOD I for fxl mobility, using a SPC, living alone and dtr was delivering groceries and checking in daily.     Hand  Dominance   Dominant Hand: Right    Extremity/Trunk Assessment   Upper Extremity Assessment Upper Extremity Assessment: Defer to OT evaluation     Lower Extremity Assessment Lower Extremity Assessment: Difficult to assess due to impaired cognition;Generalized weakness (pt able to perform bilateral knee ext in sitting but had difficulty following demands for futher testing)       Communication   Communication: No difficulties  Cognition Arousal/Alertness: Lethargic Behavior During Therapy: Flat affect Overall Cognitive Status: History of cognitive impairments - at baseline                                 General Comments: Pt oriented to self only, pt dtr at bedside states this is baseline. Requires increased cueing to follow 1-step commands. Able to answer basic questions/follow conversation.      General Comments      Exercises Other Exercises Other Exercises:   Assessment/Plan    PT Assessment Patient needs continued PT services  PT Problem List Decreased strength;Decreased activity tolerance;Decreased balance;Decreased cognition;Decreased mobility       PT Treatment Interventions Gait training;Functional mobility training;Patient/family education;DME instruction;Therapeutic activities;Therapeutic exercise;Balance training    PT Goals (Current goals can be found in the Care Plan section)  Acute Rehab PT Goals Patient Stated Goal: To get stronger and more independent PT Goal Formulation: With family Time For Goal Achievement: 07/21/19 Potential to Achieve Goals: Fair    Frequency Min 2X/week   Barriers to discharge        Co-evaluation PT/OT/SLP Co-Evaluation/Treatment: Yes Reason for Co-Treatment: Complexity of the patient's impairments (multi-system involvement);To address functional/ADL transfers PT goals addressed during session: Mobility/safety with mobility;Balance;Strengthening/ROM OT goals addressed during session:  Strengthening/ROM;ADL's and self-care       AM-PAC PT "6 Clicks" Mobility  Outcome Measure Help needed turning from your back to your side while in a flat bed without using bedrails?: Total Help needed moving from lying on your back to sitting on the side of a flat bed without using bedrails?: Total Help needed moving to and from a bed to a chair (including a wheelchair)?: Total Help needed standing up from a chair using your arms (e.g., wheelchair or bedside chair)?: Total Help needed to walk in hospital room?: Total Help needed climbing 3-5 steps with a railing? : Total 6 Click Score: 6    End of Session   Activity Tolerance: Patient tolerated treatment well Patient left: in bed;with call bell/phone within reach;with bed alarm set;with family/visitor present Nurse Communication: Mobility status PT Visit Diagnosis: Muscle weakness (generalized) (M62.81);Hemiplegia and hemiparesis;Difficulty in walking, not elsewhere classified (R26.2) Hemiplegia - Right/Left: Right Hemiplegia - dominant/non-dominant: Dominant Hemiplegia - caused by: Unspecified    Time: 1529-1600 PT Time Calculation (min) (ACUTE ONLY): 31 min   Charges:   PT Evaluation $PT Eval Moderate Complexity: 1 Mod          D. Scott Lovette Merta PT, DPT 07/08/19, 4:57 PM

## 2019-07-08 NOTE — Evaluation (Signed)
Occupational Therapy Evaluation Patient Details Name: Haley Brooks MRN: [id number] DOB: 13-Jan-1931 Today's Date: 07/08/2019    History of Present Illness Haley Brooks is an 86 y.o. female with PMH of colon cancer, Alzheimer's dementia, HTN, depression, and prior stroke (February 2021) with subsequent STR stay. Pt had discharged home from STR earlier in the week. She presents to [facility] ED with c/o of black stools. In the ED pt positive for occult blood and found to have AKI.   Clinical Impression   Haley Brooks was seen for OT/PT co-evaluation this date. Pt received semi-supine in bed, she is noted with history of dementia, and oriented to self only. Her daughter presents to bedside shortly after OT evaluation began. Pt dtr reports that, prior to this hospital admission, pt had recently [place] home from [place] where she had been recieving for the past ~3 months. Since her return home, pt dtr has been staying with her full time and providing dependent level care for all ADL/IADL and transfers. Per dtr, pt requires total assist to transfer to her [facility] which the family assists with propelling to move pt around her home. Currently pt requires MAX A +2 for bed mobility, and is noted to have flaccid RUE (dominant) with no AROM appreciated during evaluation other than 1/5 grip. Per dtr, pt was using her RUE prior to this admission to hold cups/for self feeding. Pt LUE grossly 3/5. Pt difficult to fully assess 2/2 impaired cognition, however she states sensation is equal and intact in BUE/BLE. RN/MD notified via secure chat.  Pt with significant L lateral lean when seated EOB, and requires prompting in order to turn head toward R side. She is unable to maintain seated balance w/o +1 assist. Requires MAX A+2 to return to supine. Therapists assist pt with positioning of RUE elevated, and pillows under L side to support midline positioning of her head. Pt caregiver educated on safe positioning strategies to  maximize skin integrity and pt safety. Pt would benefit from skilled OT services to address noted impairments and functional limitations (see below for any additional details) in order to maximize safety and independence while minimizing falls risk and caregiver burden. In consideration of pt recent DC from STR, and family availability/willingness to provide dependent level care, recommend 24/7 assist with [facility] services upon hospital DC. Will continue to monitor pt progress/goals of care and update DC recs as appropriate/needed.     Follow Up Recommendations  Home health OT;Supervision/Assistance - 24 hour    Equipment Recommendations  3 in 1 bedside commode    Recommendations for Other Services       Precautions / Restrictions Precautions Precautions: Fall Precaution Brooks: high fall Restrictions Weight Bearing Restrictions: No      Mobility Bed Mobility Overal bed mobility: Needs Assistance Bed Mobility: Supine to Sit;Sit to Supine     Supine to sit: Max assist;+2 for physical assistance Sit to supine: Max assist;+2 for physical assistance      Transfers                 General transfer comment: Deferred. Pt unsafe/unable.    Balance Overall balance assessment: Needs assistance Sitting-balance support: Feet supported;Single extremity supported Sitting balance-Haley Scale: Poor Sitting balance - Brooks: Pt requires at least MIN A to maintain seated balance at EOB. Significant L lateral lean noted. Postural control: Left lateral lean     Standing balance comment: Deferred.  ADL either performed or assessed with clinical judgement   ADL Overall ADL's : Needs assistance/impaired                                       General ADL Brooks: Pt is significantly functionally limited by impaired cogntion, generalized weakness, and decreased functional use of her RUE. MAX A +2 to come to sitting at EOB. Pt is  unable to maintain seated balance without +1 assist at all times. Anticipate MAX A +2 for bathing/dressing/transfers. Pt is unable to use RUE functionally, however, dtr reports she is RUE dominant. MD/RN notified.     Vision Baseline Vision/History: Wears glasses Wears Glasses: Reading only Patient Visual Report: No change from baseline Additional Brooks: Pt appears to visually track appropriately.     Perception     Praxis      Pertinent Vitals/Pain Pain Assessment: No/denies pain (States she just feels weak.)     Hand Dominance Right   Extremity/Trunk Assessment Upper Extremity Assessment Upper Extremity Assessment: RUE deficits/detail;LUE deficits/detail RUE Deficits / Details: RUE noted to be flacid with no observable or trace movements initiated during session despite prompting. Pt with 1/5 grip strength. Per dtr, pt is R hand dominant and was using her RUE to hold cups, etc. prior to admission. Pt endorses full sensation in RUE, however difficult assess 2/2 impaired cognition. RUE Coordination: decreased gross motor;decreased fine motor LUE Deficits / Details: Generally weak, grossly 3/5 with decreased [place].   Lower Extremity Assessment Lower Extremity Assessment: Generalized weakness;Defer to PT evaluation       Communication Communication Communication: No difficulties   Cognition Arousal/Alertness: Lethargic Behavior During Therapy: Flat affect Overall Cognitive Status: History of cognitive impairments - at baseline                                 General Brooks: Pt oriented to self only, pt dtr at bedside states this is baseline. Requires increased cueing to follow 1-step commands. Able to answer basic questions/follow conversation.   General Brooks       Exercises Other Exercises Other Exercises: Pt and caregiver (dtr presents to bedside during session) educated on role of OT in acute setting, bed mobility techniques, and safe positioning  strategies for RUE to maximize pt safety and skin integrity.   Shoulder Instructions      Home Living Family/patient expects to be discharged to:: Private residence Living Arrangements: Children Available Help at Discharge: Family;Available 24 hours/day Type of Home: House Home Access: Stairs to enter [employer] of Steps: 3 Entrance Stairs-Rails: Right Home Layout: One level     Bathroom Shower/Tub: Occupational psychologist: Handicapped height Bathroom Accessibility: Yes How Accessible: Accessible via walker Home Equipment: [place] - 2 wheels;Cane - single point;Shower seat - built in;Grab bars - toilet;Grab bars - tub/shower;Hand held shower head;Wheelchair - manual          Prior Functioning/Environment Level of Independence: Needs assistance  Gait / Transfers Assistance Needed: Per dtr, pt has been dependent for transfers since returning home from STR. Family is using a transport WC to move pt around the home. ADL's / Homemaking Assistance Needed: Per dtr, pt is dependent for all ADL including bathing, dressing, and grooming. She is able to feed herself, but has limited apetite. Dtr states she usually feeds her.   Brooks:  Prior to initial admission ~3 months prior to this admission, pt was MOD I for fxl mobility, using a SPC, living alone and dtr was delivering groceries and checking in daily.        OT Problem List: Decreased strength;Decreased coordination;Impaired UE functional use;Decreased activity tolerance;Decreased safety awareness;Decreased range of motion;Decreased cognition;Impaired balance (sitting and/or standing)      OT Treatment/Interventions: Self-care/ADL training;Therapeutic exercise;Therapeutic activities;DME and/or AE instruction;Patient/family education;Balance training;Neuromuscular education;Cognitive remediation/compensation    OT Goals(Current goals can be found in the care plan section) Acute Rehab OT Goals Patient Stated  Goal: To get stronger and more independent OT Goal Formulation: With patient/family Time For Goal Achievement: 07/22/19 Potential to Achieve Goals: Fair ADL Goals Pt Will Perform Eating: with mod assist;with adaptive utensils;sitting;bed level (c LRAD PRN for improved safety and fxl indep.) Pt Will Perform Grooming: sitting;with min assist;with adaptive equipment (c LRAD PRN for improved safety and fxl indep.) Pt Will Transfer to Toilet: bedside commode;stand pivot transfer;with mod assist (c LRAD PRN for improved safety and fxl indep.)  OT Frequency: Min 2X/week   Barriers to D/C: Inaccessible home environment  Pt with STE the home.       Co-evaluation PT/OT/SLP Co-Evaluation/Treatment: Yes Reason for Co-Treatment: Complexity of the patient's impairments (multi-system involvement);For patient/therapist safety;To address functional/ADL transfers PT goals addressed during session: Mobility/safety with mobility;Balance;Strengthening/ROM OT goals addressed during session: ADL's and self-care;Strengthening/ROM      AM-PAC OT "6 Clicks" Daily Activity     Outcome Measure Help from another person eating meals?: A Lot Help from another person taking care of personal grooming?: A Lot Help from another person toileting, which includes using toliet, bedpan, or urinal?: A Lot Help from another person bathing (including washing, rinsing, drying)?: A Lot Help from another person to put on and taking off regular upper body clothing?: A Lot Help from another person to put on and taking off regular lower body clothing?: A Lot 6 Click Score: 12   End of Session    Activity Tolerance: Patient tolerated treatment well Patient left: in bed;with call bell/phone within reach;with bed alarm set;with family/visitor present;Other (comment) (With PT in room to finish up session.)  OT Visit Diagnosis: Other abnormalities of gait and mobility (R26.89);Muscle weakness (generalized) (M62.81);Hemiplegia and  hemiparesis Hemiplegia - Right/Left: Right Hemiplegia - dominant/non-dominant: Dominant Hemiplegia - caused by: Unspecified                Time: 5790-3833 OT Time Calculation (min): 45 min Charges:  OT General Charges $OT Visit: 1 Visit OT Evaluation $OT Eval Moderate Complexity: 1 Mod OT Treatments $Self Care/Home Management : 8-22 mins  Shara Blazing, M.S., OTR/L Ascom: [phone] 07/08/19, 4:36 PM

## 2019-07-08 NOTE — Progress Notes (Addendum)
[facility] PROGRESS NOTE   Haley Brooks XKG:[id number] DOB: 1931/12/28 DOA: 07/07/2019  PCP: Kirk Ruths, MD  Brief History/Interval Summary: 86 y.o. female with a past medical history of colon cancer, Alzheimer's dementia, essential hypertension, history of depression, history of stroke in February of this year, who was living in a skilled nursing facility till past week and came back home this past Thursday.  She has been cared for at home by her daughter.  Most of the history was provided by the daughter.  Patient is a very poor historian due to her dementia.  According to the daughter patient was complaining of some nausea on Thursday and Friday.  For the nausea the daughter gave her medications similar to Pepto-Bismol although she is not certain.  It was recommended by a pharmacist at [facility].  Subsequently daughter noticed the patient was not having any bowel movements.  So she gave her a laxative which was followed by onset of multiple loose stools.  The stools were black in color.  Patient did not have any vomiting.  No fever or chills.  Patient denies any pain currently. Apparently she was experiencing extremely poor oral intake for past several weeks.  Apparently this has been the case even at the skilled nursing facility.  She was started on appetite stimulant while she was there.    In the emergency department stool was tested and was positive for occult blood.  It was black in color as per the ED provider.  Hemoglobin was noted to be stable however.  She was found to have acute kidney injury.  Patient's daughter also mentioned that patient had lost about 22 pounds in the last 3 to 4 months.  Patient denies any difficulty swallowing.  According to the daughter she is able to swallow her tablets and other medications without difficulties.  She just does not want to eat any food.   Reason for Visit: Acute kidney injury.  Questionable melanotic stools  Consultants:  Gastroenterology.  Procedures: None yet  Antibiotics: Anti-infectives (From admission, onward)   None      Subjective/Interval History: Patient denies any complaints this morning.  As noted earlier she is a very poor historian.  ROS: Unable to do due to her dementia    Assessment/Plan:  ADDENDUM Called by the occupational therapist that the patient was not moving her right arm.  Patient quickly seen at bedside.  Her vital signs have been stable.  This morning patient was hypoglycemic.  CBG was rechecked and was 113.  Patient has dementia at baseline and is unable to provide any history.  Discussed with nursing staff.  It remains unclear as to when her onset of symptoms was.  Could have been sometime during the night or sometime earlier this morning.  Patient likely not a candidate for TPA since it remains unclear when her symptoms began.  Telemetry neurology has been consulted.  Patient has been off of her Eliquis for an indeterminate amount of time.  It was held during this admission due to concern for GI bleed.  All of the above discussed with daughter.  Await neurology input.  MRI has been ordered.  Due to elevated creatinine doing a CT angiogram could be problematic as well.  Acute kidney injury with metabolic acidosis This was most likely due to poor oral intake.  Her baseline creatinine seems to be around 1.3.  She presented with a creatinine of 2.93 and a BUN of 72.   She was started  on IV fluids.  Noted to be 2.87 today.  BUN still up at 74.  CT scan shows bilateral renal cysts but no evidence of obstruction.  Atrophic right kidney was noted.  Compensatory increase in size of the left kidney is noted.  Monitor urine output.  Recheck labs tomorrow.    Questionable melanotic stool Daughter mentioned black-colored stool.  Unclear as to the etiology for the same.  It looks like she is very constipated based on CT scan.  She was given laxatives and products similar to Pepto-Bismol.  CT  scan also shows significantly thickened stomach wall which could be due to gastritis.  Continue PPI.  GI input has been requested.  No loose stools since yesterday.  We can cancel the stool studies as its unlikely that she has any infectious etiology.  Acute blood loss anemia Hemoglobin noted to be lower this morning at 9.8 compared to 11.8 yesterday.  Per nursing staff she has not had any further episodes of loose stools or stool otherwise.  History of anorexia and weight loss/thickened gastric wall CT scan findings as noted above.  Gastroenterology input has been recommended.  Hypoglycemia Likely due to n.p.o. status.  Change IV fluids to D5.  1 ampoule of D50.  Recheck glucose levels.  History of paroxysmal atrial fibrillation It appears patient was on Eliquis until recently.  Daughter thinks that it was discontinued by providers in the skilled nursing facility.  This will need to be verified.  Holding it for now.  History of colon cancer in 2005 She is status post colectomy and chemotherapy previously.  History of Alzheimer's dementia Seems to be close to her baseline.  Continue Aricept and Namenda.  History of embolic stroke in February 2021 Does not seem to have significant deficits.  Need to verify her anticoagulation status.  Holding for now due to concern for bleeding.  Other findings on CT abdomen Fibroid uterus. Chronic compression fracture deformities of the T11, L4 and L5 vertebral bodies.  Bilateral renal cysts. This can be followed in the outpatient setting.   DVT Prophylaxis: SCDs Code Status: Full code Family Communication: No family at bedside.  Will call daughter later today Disposition Plan:  Status is: Inpatient  Remains inpatient appropriate because:Altered mental status and Ongoing diagnostic testing needed not appropriate for outpatient work up   Dispo: The patient is from: Home              Anticipated d/c is to: To be determined              Anticipated  d/c date is: 2 days              Patient currently is not medically stable to d/c.     Medications:  Scheduled: . dextrose      . donepezil  10 mg Oral BID  . levothyroxine  150 mcg Oral Q1500  . memantine  10 mg Oral BID  . pantoprazole (PROTONIX) IV  40 mg Intravenous Q12H   Continuous: . dextrose 5 % and 0.45% NaCl 75 mL/hr at 07/08/19 0934   KVQ:[id number] **OR** acetaminophen, metoprolol tartrate, ondansetron **OR** ondansetron (ZOFRAN) IV   Objective:  Vital Signs  Vitals:   07/07/19 1930 07/08/19 0014 07/08/19 0616 07/08/19 0740  BP: 120/60 121/67 (!) 130/57 (!) 136/54  Pulse:  84 (!) 44 88  Resp: 17 17 14 17   Temp:  98.3 F (36.8 C) 97.7 F (36.5 C) (!) 97.4 F (36.3 C)  TempSrc:  Oral  Oral Oral  SpO2:  97% 98% 100%  Weight:      Height:       No intake or output data in the 24 hours ending 07/08/19 1123 Filed Weights   07/07/19 1150  Weight: 56.7 kg    General appearance: Awake alert.  In no distress.  Distracted Resp: Clear to auscultation bilaterally.  Normal effort Cardio: S1-S2 is normal regular.  No S3-S4.  No rubs murmurs or bruit GI: Abdomen is soft.  Nontender nondistended.  Bowel sounds are present normal.  No masses organomegaly Extremities: No edema.  Neurologic:  No focal neurological deficits.    Lab Results:  Data Reviewed: I have personally reviewed following labs and imaging studies  CBC: Recent Labs  Lab 07/07/19 1201 07/07/19 2042 07/08/19 0500  WBC 12.1*  --  15.6*  HGB 11.8* 10.4* 9.8*  HCT 36.6 32.2* 30.3*  MCV 89.9  --  89.6  PLT 316  --  347    Basic Metabolic Panel: Recent Labs  Lab 07/07/19 1201 07/08/19 0500  NA 138 135  K 4.3 4.4  CL 105 106  CO2 17* 18*  GLUCOSE 97 58*  BUN 72* 74*  CREATININE 2.93* 2.87*  CALCIUM 9.2 8.5*    GFR: Estimated Creatinine Clearance: 12.4 mL/min (A) (by C-G formula based on SCr of 2.87 mg/dL (H)).  Liver Function Tests: Recent Labs  Lab 07/07/19 1201  07/08/19 0500  AST 26 23  ALT 15 13  ALKPHOS 68 57  BILITOT 0.8 0.9  PROT 7.4 6.2*  ALBUMIN 3.1* 2.5*    Coagulation Profile: Recent Labs  Lab 07/07/19 1554 07/08/19 0500  INR 1.4* 1.4*    CBG: Recent Labs  Lab 07/08/19 0900  GLUCAP 42*     Recent Results (from the past 240 hour(s))  SARS Coronavirus 2 by RT PCR (hospital order, performed in [facility] hospital lab) Nasopharyngeal Nasopharyngeal Swab     Status: None   Collection Time: 07/07/19  4:09 PM   Specimen: Nasopharyngeal Swab  Result Value Ref Range Status   SARS Coronavirus 2 NEGATIVE NEGATIVE Final    Comment: (NOTE) SARS-CoV-2 target nucleic acids are NOT DETECTED.  The SARS-CoV-2 RNA is generally detectable in upper and lower respiratory specimens during the acute phase of infection. The lowest concentration of SARS-CoV-2 viral copies this assay can detect is 250 copies / mL. A negative result does not preclude SARS-CoV-2 infection and should not be used as the sole basis for treatment or other patient management decisions.  A negative result may occur with improper specimen collection / handling, submission of specimen other than nasopharyngeal swab, presence of viral mutation(s) within the areas targeted by this assay, and inadequate number of viral copies (<250 copies / mL). A negative result must be combined with clinical observations, patient history, and epidemiological information.  Fact Sheet for Patients:   StrictlyIdeas.no  Fact Sheet for Healthcare Providers: BankingDealers.co.za  This test is not yet approved or  cleared by the [place] FDA and has been authorized for detection and/or diagnosis of SARS-CoV-2 by FDA under an Emergency Use Authorization (EUA).  This EUA will remain in effect (meaning this test can be used) for the duration of the COVID-19 declaration under Section 564(b)(1) of the Act, 21 U.S.C. section 360bbb-3(b)(1),  unless the authorization is terminated or revoked sooner.  Performed at [facility], [address], [place], [place] [address]       Radiology Studies: CT ABDOMEN PELVIS WO CONTRAST  Result Date: 07/07/2019 CLINICAL DATA:  Weight loss and poor appetite. EXAM: CT ABDOMEN AND PELVIS WITHOUT CONTRAST TECHNIQUE: Multidetector CT imaging of the abdomen and pelvis was performed following the standard protocol without IV contrast. COMPARISON:  March 21, 2019 FINDINGS: Lower chest: Mild atelectasis is seen within the bilateral lung bases. There is a small right pleural effusion. Hepatobiliary: No focal liver abnormality is seen. Status post cholecystectomy. The common bile duct is dilated (0.9 cm). Pancreas: Unremarkable. No pancreatic ductal dilatation or surrounding inflammatory changes. Spleen: Normal in size without focal abnormality. Adrenals/Urinary Tract: Adrenal glands are unremarkable. The right kidney is atrophic in appearance. Mild compensatory hypertrophy of the left kidney is seen. A 2.7 cm cyst is seen within the mid right kidney. Multiple cysts are seen within the left kidney. The largest measures 4.0 cm in diameter and is located along the anterolateral aspect of the mid to upper left kidney. Bladder is unremarkable. Stomach/Bowel: There is moderate severity diffuse gastric wall thickening. The appendix is not clearly identified. No evidence of bowel dilatation. A large amount of stool is seen within the distal sigmoid colon. Noninflamed diverticula are seen throughout the large bowel. Vascular/Lymphatic: There is moderate severity calcification of the abdominal aorta. No enlarged abdominal or pelvic lymph nodes. Reproductive: A 3.0 cm x 2.2 cm well-defined area of low attenuation is seen within the uterine fundus. Other: No abdominal wall hernia or abnormality. No abdominopelvic ascites. Musculoskeletal: Chronic compression fracture deformities are seen at the levels of T11, L4 and  L5. IMPRESSION: 1. Moderate severity diffuse gastric wall thickening which may represent sequelae associated with an acute gastritis. Further evaluation is recommended as an underlying neoplastic process cannot be excluded. 2. Small right pleural effusion. 3. Status post cholecystectomy. 4. Bilateral renal cysts. 5. Findings likely consistent with a fibroid uterus. 6. Large amount of stool within the distal sigmoid colon. 7. Chronic compression fracture deformities of the T11, L4 and L5 vertebral bodies. 8. Aortic atherosclerosis. Aortic Atherosclerosis (ICD10-I70.0). Electronically Signed   By: Virgina Norfolk M.D.   On: 07/07/2019 20:14       LOS: 1 day   [facility] Pager on [email or web address]  07/08/2019, 11:23 AM

## 2019-07-08 NOTE — Consult Note (Signed)
GI Inpatient Consult Note  Reason for Consult: Dark stools, nausea, unintentional weight loss, heme positive stool   Attending Requesting Consult: Dr. Bonnielee Haff  History of Present Illness: Haley Brooks is a 86 y.o. female seen for evaluation of dark stools, heme positive stool, nausea, and unintentional weight loss at the request of Dr. Nicki Guadalajara.  Pt has a PMH of Alzheimer's dementia, HTN, depression, Hx of CVA 02/2019, Hx of colon cancer. Patient has been living in SNF until this past week which she came home last Thursday. She is cared for currently by her daughter who is present in the room. She reports when her mother came home Thursday she was not eating right and complained of severe nausea without any emesis. Patient did have acute embolic stroke in February of this year and was reportedly taken off of her Eliquis while at the SNF. Patient was given a dose of Pepto-Bismol on Thursday at the request of a pharmacist due to her GI symptoms. Unfortunately, this caused some constipation and patient was given a laxative and caused multiple loose BMs. Daughter reports these BMs were black and this concerned her. Patient and daughter saw Grayland Ormond, PA-C at the [facility] were labs showed drop in her hemoglobin and she was advised to come to the ED. Upon presentation to the ED, she had hemoglobin 9.8 which was a 2-gram drop from her baseline from few days prior. Per ED physician, she had dark stool in her rectal vault which was heme positive. CT abd/pelvis without contrast performed showed moderate severity diffuse gastric wall thickening and further evaluation recommended as underlying neoplastic process cannot be definitively ruled out. She has reportedly had decreased appetite and poor PO intake for the past few weeks and per daughter has lost appx 22 lbs since her stroke in February. There appears to be no fever, chills, night sweats, dysphagia, odynophagia, abdominal pain, or gross  hematochezia. Since admission there has been no gross hematochezia or melena. There is a personal hx of colon cancer diagnosed in 2005 where she had cecal mass s/p partial colectomy and chemotherapy. Per daughter, no known family history of esophageal or gastric malignancies.    Past Medical History:  Past Medical History:  Diagnosis Date  . Cancer [facility])    colon cancer   . Hemorrhoid   . Hypertension   . Thyroid disease     Problem List: Patient Active Problem List   Diagnosis Date Noted  . Weight loss 07/07/2019  . AKI (acute kidney injury) ([place]) 07/07/2019  . Melena 07/07/2019  . Altered mental status   . CVA (cerebral vascular accident) ([place]) 03/21/2019  . AF (paroxysmal atrial fibrillation) ([place]) 03/21/2019  . Acute CVA (cerebrovascular accident) ([place]) 03/21/2019  . Anemia due to stage 4 chronic kidney disease ([place]) 03/21/2019  . Acute delirium 02/21/2017  . Alzheimer's dementia ([place]) 02/21/2017  . Acute encephalopathy 02/18/2017  . Acute metabolic encephalopathy 16/60/6301  . UTI (urinary tract infection) 12/24/2016  . Hemorrhoid   . History of colon cancer 04/05/2015  . Chronic anemia 03/10/2015  . Osteopenia 12/11/2014  . SDAT (senile dementia of Alzheimer's type) ([place]) 06/18/2014  . Acquired hypothyroidism 03/01/2014  . Essential hypertension 03/01/2014  . Mixed hyperlipidemia 03/01/2014    Past Surgical History: Past Surgical History:  Procedure Laterality Date  . COLON SURGERY    . FRACTURE SURGERY      Allergies: No Known Allergies  Home Medications: Medications Prior to Admission  Medication Sig Dispense Refill Last Dose  .  alendronate (FOSAMAX) 70 MG tablet Take 1 tablet by mouth once a week.    Past Week at Unknown time  . amiodarone (PACERONE) 200 MG tablet Take 200 mg by mouth daily at 6 (six) AM.   07/07/2019 at Unknown time  . apixaban (ELIQUIS) 5 MG TABS tablet Take 5 mg by mouth 2 (two) times daily.   07/07/2019 at Unknown time  . benazepril  (LOTENSIN) 10 MG tablet Take 1 tablet (10 mg total) by mouth daily. 30 tablet 0 07/07/2019 at Unknown time  . donepezil (ARICEPT) 10 MG tablet Take 1 tablet by mouth 2 (two) times daily.   07/07/2019 at Unknown time  . ferrous sulfate 325 (65 FE) MG tablet Take 325 mg by mouth 2 (two) times daily with a meal.   07/07/2019 at Unknown time  . levothyroxine (SYNTHROID) 150 MCG tablet Take 1 tablet by mouth daily in the afternoon.   07/07/2019 at Unknown time  . memantine (NAMENDA) 10 MG tablet Take 10 mg by mouth 2 (two) times daily.   07/07/2019 at Unknown time  . metoprolol succinate (TOPROL-XL) 25 MG 24 hr tablet Take 25 mg by mouth daily at 6 (six) AM.   07/07/2019 at Unknown time  . senna-docusate (SENOKOT-S) 8.6-50 MG tablet Take 2 tablets by mouth at bedtime as needed for mild constipation or moderate constipation. 30 tablet 0 07/06/2019 at Unknown time   Home medication reconciliation was completed with the patient.   Scheduled Inpatient Medications:   . dextrose      . donepezil  10 mg Oral BID  . levothyroxine  150 mcg Oral Q1500  . memantine  10 mg Oral BID  . pantoprazole (PROTONIX) IV  40 mg Intravenous Q12H    Continuous Inpatient Infusions:   . dextrose 5 % and 0.45% NaCl 75 mL/hr at 07/08/19 0934    PRN Inpatient Medications:  acetaminophen **OR** acetaminophen, metoprolol tartrate, ondansetron **OR** ondansetron (ZOFRAN) IV  Family History: family history includes Cancer in her brother; Colon cancer in her mother; Diabetes in her father.  The patient's family history is negative for inflammatory bowel disorders, GI malignancy, or solid organ transplantation.  Social History:   reports that she has never smoked. She has never used smokeless tobacco. She reports that she does not drink alcohol and does not use drugs. The patient denies ETOH, tobacco, or drug use.   Review of Systems:  Unable to obtain due to patient's dementia at baseline  Physical Examination: BP 130/69 (BP  Location: Left Arm)   Pulse (!) 43   Temp 98.1 F (36.7 C) (Oral)   Resp 17   Ht 5\' 4"  (1.626 m)   Wt 56.7 kg   SpO2 100%   BMI 21.46 kg/m   Pleasantly confused female laying in hospital bed, no acute distress Gen: NAD, alert and oriented x 4 HEENT: PEERLA, EOMI, Neck: supple, no JVD or thyromegaly Chest: CTA bilaterally, no wheezes, crackles, or other adventitious sounds CV: RRR, no m/g/c/r Abd: soft, NT, ND, +BS in all four quadrants; no HSM, guarding, ridigity, or rebound tenderness Ext: no edema, well perfused with 2+ pulses, Skin: no rash or lesions noted Lymph: no LAD  Data: Lab Results  Component Value Date   WBC 15.6 (H) 07/08/2019   HGB 9.8 (L) 07/08/2019   HCT 30.3 (L) 07/08/2019   MCV 89.6 07/08/2019   PLT 226 07/08/2019   Recent Labs  Lab 07/07/19 1201 07/07/19 2042 07/08/19 0500  HGB 11.8* 10.4* 9.8*  Lab Results  Component Value Date   NA 135 07/08/2019   K 4.4 07/08/2019   CL 106 07/08/2019   CO2 18 (L) 07/08/2019   BUN 74 (H) 07/08/2019   CREATININE 2.87 (H) 07/08/2019   Lab Results  Component Value Date   ALT 13 07/08/2019   AST 23 07/08/2019   ALKPHOS 57 07/08/2019   BILITOT 0.9 07/08/2019   Recent Labs  Lab 07/07/19 1554 07/07/19 1554 07/08/19 0500  APTT 32  --   --   INR 1.4*   < > 1.4*   < > = values in this interval not displayed.   CT abd/pelvis wo contrast 07/07/19: FINDINGS: Lower chest: Mild atelectasis is seen within the bilateral lung bases.  There is a small right pleural effusion.  Hepatobiliary: No focal liver abnormality is seen. Status post cholecystectomy. The common bile duct is dilated (0.9 cm).  Pancreas: Unremarkable. No pancreatic ductal dilatation or surrounding inflammatory changes.  Spleen: Normal in size without focal abnormality.  Adrenals/Urinary Tract: Adrenal glands are unremarkable. The right kidney is atrophic in appearance. Mild compensatory hypertrophy of the left kidney is seen. A 2.7  cm cyst is seen within the mid right kidney. Multiple cysts are seen within the left kidney. The largest measures 4.0 cm in diameter and is located along the anterolateral aspect of the mid to upper left kidney. Bladder is unremarkable.  Stomach/Bowel: There is moderate severity diffuse gastric wall thickening. The appendix is not clearly identified. No evidence of bowel dilatation. A large amount of stool is seen within the distal sigmoid colon. Noninflamed diverticula are seen throughout the large bowel.  Vascular/Lymphatic: There is moderate severity calcification of the abdominal aorta. No enlarged abdominal or pelvic lymph nodes.  Reproductive: A 3.0 cm x 2.2 cm well-defined area of low attenuation is seen within the uterine fundus.  Other: No abdominal wall hernia or abnormality. No abdominopelvic ascites.  Musculoskeletal: Chronic compression fracture deformities are seen at the levels of T11, L4 and L5.  IMPRESSION: 1. Moderate severity diffuse gastric wall thickening which may represent sequelae associated with an acute gastritis. Further evaluation is recommended as an underlying neoplastic process cannot be excluded. 2. Small right pleural effusion. 3. Status post cholecystectomy. 4. Bilateral renal cysts. 5. Findings likely consistent with a fibroid uterus. 6. Large amount of stool within the distal sigmoid colon. 7. Chronic compression fracture deformities of the T11, L4 and L5 vertebral bodies. 8. Aortic atherosclerosis.  Assessment/Plan:  86 y/o AA female with a PMH of Alzheimer's dementia, HTN, depression, Hx of CVA 02/2019, Hx of colon cancer 2005 s/p partial colectomy and chemotherapy admitted for poor PO intake and questionable melanotic stool   1. Abnormal CT scan - gastritis 2. Questionable melanotic stool/heme positive stool 3. AKI with metabolic acidosis 4. Hx of colon cancer dx 2005 s/p colectomy and chemotherapy 5. Alzheimer's dementia 6. Hx of embolic  stroke [id number] - not on Eliquis currently  COVID-19 Test: NEGATIVE  Recommendations:  -Questionable melanotic stool vs side effect from recent usage of Pepto-Bismol. Given her drop in hemoglobin, cross-sectional imaging results, and symptoms, concern for potential UGI pathology. DDx includes gastritis +/- H pylori, peptic ulcer disease, duodenitis, adenocarcinoma, enteritis, right colon source, etc -Agree with acid suppression therapy -Advise EGD for luminal evaluation and biopsies to rule out adenocarcinoma, H pylori, PUD, atrophic gastritis, etc. Discussed procedure details and indications with patient and daughter in detail. -Daughter would like time to discuss with family members. We will have further discussion  about EGD tomorrow morning and will keep NPO after midnight   Thank you for the consult. Please call with questions or concerns.  Reeves Forth [facility] [phone] (Cell)

## 2019-07-09 ENCOUNTER — Inpatient Hospital Stay
Admit: 2019-07-09 | Discharge: 2019-07-09 | Disposition: A | Discharge status: Home / Self Care | Payer: Medicare Other | Attending: Internal Medicine | Admitting: Internal Medicine

## 2019-07-09 DIAGNOSIS — I639 Cerebral infarction, unspecified: Secondary | ICD-10-CM

## 2019-07-09 LAB — LIPID PANEL
Cholesterol: 74 mg/dL (ref 0–200)
HDL: 25 mg/dL — ABNORMAL LOW (ref >40)
LDL Cholesterol: 38 mg/dL (ref 0–99)
Total CHOL/HDL Ratio: 3 RATIO
Triglycerides: 54 mg/dL (ref <150)
VLDL: 11 mg/dL (ref 0–40)

## 2019-07-09 LAB — GLUCOSE, CAPILLARY
Glucose-Capillary: 74 mg/dL (ref 70–99)
Glucose-Capillary: 80 mg/dL (ref 70–99)
Glucose-Capillary: 86 mg/dL (ref 70–99)
Glucose-Capillary: 95 mg/dL (ref 70–99)

## 2019-07-09 LAB — BASIC METABOLIC PANEL
Anion gap: 9 (ref 5–15)
BUN: 65 mg/dL — ABNORMAL HIGH (ref 8–23)
CO2: 18 mmol/L — ABNORMAL LOW (ref 22–32)
Calcium: 8.3 mg/dL — ABNORMAL LOW (ref 8.9–10.3)
Chloride: 108 mmol/L (ref 98–111)
Creatinine, Ser: 2.51 mg/dL — ABNORMAL HIGH (ref 0.44–1.00)
GFR calc Af Amer: 20 mL/min — ABNORMAL LOW (ref >60)
GFR calc non Af Amer: 17 mL/min — ABNORMAL LOW (ref >60)
Glucose, Bld: 105 mg/dL — ABNORMAL HIGH (ref 70–99)
Potassium: 4.1 mmol/L (ref 3.5–5.1)
Sodium: 135 mmol/L (ref 135–145)

## 2019-07-09 LAB — CBC
HCT: 28.1 % — ABNORMAL LOW (ref 36.0–46.0)
Hemoglobin: 9.7 g/dL — ABNORMAL LOW (ref 12.0–15.0)
MCH: 29.8 pg (ref 26.0–34.0)
MCHC: 34.5 g/dL (ref 30.0–36.0)
MCV: 86.2 fL (ref 80.0–100.0)
Platelets: 214 10*3/uL (ref 150–400)
RBC: 3.26 MIL/uL — ABNORMAL LOW (ref 3.87–5.11)
RDW: 14.6 % (ref 11.5–15.5)
WBC: 16 10*3/uL — ABNORMAL HIGH (ref 4.0–10.5)
nRBC: 0 % (ref 0.0–0.2)

## 2019-07-09 LAB — HEMOGLOBIN A1C
Hgb A1c MFr Bld: 6.2 % — ABNORMAL HIGH (ref 4.8–5.6)
Mean Plasma Glucose: 131.24 mg/dL

## 2019-07-09 LAB — ECHOCARDIOGRAM COMPLETE
Height: 64 in
Weight: 2000 oz

## 2019-07-09 NOTE — Progress Notes (Signed)
Occupational Therapy Treatment Patient Details Name: Haley Brooks MRN: [id number] DOB: 02-Apr-1931 Today's Date: 07/09/2019    History of present illness [name] is an 86 y.o. female with PMH of colon cancer, Alzheimer's dementia, HTN, depression, and prior stroke (February 2021) with subsequent STR stay. Pt had discharged home from STR earlier in the week. She presents to [facility] ED with c/o of black stools. MD assessment includes: AKI, melanotic stool, acute blood loss anemia, anorexia, hypoglycemia, afib, and chronic compression fracture deformities of the T11, L4 and L5 vertebral bodies. During admission, pt noted with acute R sided weakness. Code stroke was called. MRI revealed: numerous scattered small areas of acute ischemia likely indicating a central embolic source.   OT comments  Haley Brooks was seen for OT treatment on this date. Upon arrival to room pt appears to be sleeping, wakes to VC's and asks this Pryor Curia "where have you been?". Pt agreeable to OT tx session. Denies pain at rest. She is noted to have increased edema in her RUE. RN notified/aware, and informs this Pryor Curia that pt IV had infiltrated earlier this date. Therapist engages pt in BUE therapeutic exercises as described below. During gentle PROM of RUE, pt endorses pain with shoulder movement. Exercises limited to 5 reps to maximize pt safety/comfort. Pt requires AAROM when engaging LUE in exercises. Therapist assists pt with positioning in bed to maximize skin integrity, safety, and functional use of RUE. RN updated on pt participation in session. Pt making progressing toward goals and continues to benefit from skilled OT services to maximize return to PLOF and minimize risk of future falls, injury, caregiver burden, and readmission. Will continue to follow POC. Discharge recommendation remains appropriate.    Follow Up Recommendations  Home health OT;Supervision/Assistance - 24 hour    Equipment Recommendations   3 in 1 bedside commode    Recommendations for Other Services      Precautions / Restrictions Precautions Precautions: Fall Precaution Comments: high fall Restrictions Weight Bearing Restrictions: No       Mobility Bed Mobility               General bed mobility comments: Mobility deferred for pt safety/comfort.  Transfers                      Balance Overall balance assessment: Needs assistance                                         ADL either performed or assessed with clinical judgement   ADL Overall ADL's : Needs assistance/impaired                                       General ADL Comments: Pt with new diagnosis of acute stroke. Continues to be significantly functionally impaired by decreased use of her RUE, decreased cognition, and generalized weakness. TEE pending at time of OT eval. Pt NPO status. RUE continues to be flaccid, with noted swelling this date. MAX A to facilitate positioning to maximize safety and skin integrity. Pt continues to require +2 assist for mobility attempts.     Vision Baseline Vision/History: Wears glasses Wears Glasses: Reading only Patient Visual Report: No change from baseline     Perception     Praxis  Cognition Arousal/Alertness: Lethargic Behavior During Therapy: Flat affect Overall Cognitive Status: History of cognitive impairments - at baseline                                 General Comments: Pt oriented to self and place today. States "I think I'm in the hospital". Is able to appropriately answer basic questions/follow conversation.        Exercises Hand Exercises Wrist Flexion: PROM;Both;5 reps;AAROM (PROM RUE, AAROM LUE.) Wrist Extension: PROM;AAROM;Both;5 reps (PROM RUE, AAROM LUE.) Low Level/ICU Exercises Shoulder Flexion: 5 reps;Both;PROM;AAROM (PROM RUE, AAROM LUE.) Elbow Flexion: PROM;AAROM;Both;5 reps Shoulder Press: PROM;AAROM;5 reps Other  Exercises Other Exercises: OT facilitates bed level ther-ex as listed below. Pt requires PROM for RUE, AAROM for LUE. OT facilitates safe positioning of pt RUE to maximize safety and skin integrity. RN notified/educated on positional strategies.   Shoulder Instructions       General Comments      Pertinent Vitals/ Pain       Pain Assessment: Faces Faces Pain Scale: Hurts a little bit Pain Location: RUE with shoulder flexion Pain Intervention(s): Limited activity within patient's tolerance;Monitored during session;Repositioned  Home Living                                          Prior Functioning/Environment              Frequency  Min 2X/week        Progress Toward Goals  OT Goals(current goals can now be found in the care plan section)  Progress towards OT goals: Progressing toward goals  Acute Rehab OT Goals Patient Stated Goal: To get stronger and more independent OT Goal Formulation: With patient/family Time For Goal Achievement: 07/22/19 Potential to Achieve Goals: [place] Discharge plan remains appropriate;Frequency remains appropriate    Co-evaluation                 AM-PAC OT "6 Clicks" Daily Activity     Outcome Measure   Help from another person eating meals?: A Lot Help from another person taking care of personal grooming?: A Lot Help from another person toileting, which includes using toliet, bedpan, or urinal?: A Lot Help from another person bathing (including washing, rinsing, drying)?: A Lot Help from another person to put on and taking off regular upper body clothing?: A Lot Help from another person to put on and taking off regular lower body clothing?: A Lot 6 Click Score: 12    End of Session    OT Visit Diagnosis: Other abnormalities of gait and mobility (R26.89);Muscle weakness (generalized) (M62.81);Hemiplegia and hemiparesis Hemiplegia - Right/Left: Right Hemiplegia - dominant/non-dominant:  Dominant Hemiplegia - caused by: Cerebral infarction   Activity Tolerance Patient tolerated treatment well   Patient Left in bed;with call bell/phone within reach;with bed alarm set   Nurse Communication Other (comment) (Positioned with RUE propped, bolster to L of head to maximize neutral positioning.)        Time: 6222-9798 OT Time Calculation (min): 13 min  Charges: OT General Charges $OT Visit: 1 Visit OT Treatments $Therapeutic Exercise: 8-22 mins  Haley Brooks, M.S., OTR/L Ascom: [phone] 07/09/19, 2:13 PM

## 2019-07-09 NOTE — Progress Notes (Signed)
*  PRELIMINARY RESULTS* Echocardiogram 2D Echocardiogram has been performed.  Haley Brooks 07/09/2019, 10:03 AM

## 2019-07-09 NOTE — Progress Notes (Signed)
Brief GI note  Patient had embolic stroke yesterday. NO gastrointestinal bleeding currently. Hgb 9.7 and stable for > 24 hrs.  Vitals reviewed.   I advocate for anticoagulation despite recent GI bleeding. Patient is not a candidate for elective endoscopy in the absence of frank hemorrhage. Will continue to follow.    Robet Leu, M.D. ABIM Diplomate in Gastroenterology [place]

## 2019-07-09 NOTE — Consult Note (Signed)
Requesting Physician: Maryland Pink    Chief Complaint: AMS  I have been asked by Dr. Maryland Pink to see this patient in consultation for acute infarct.  HPI: Haley Brooks is an 86 y.o. female with a medical history of colon cancer, Alzheimer's dementia, essential hypertension, history of depression, history of stroke in February of this year, who was living in a skilled nursing facility till past weekand came back homethis past Thursday. She has been cared for at home by her daughter. Was admitted with AKI and GIB.  There was some confusion at home concerning medications and Eliquis was not continued.  Was not reinitiated on admission due to GIB.  Yesterday was felt to have a change in mental status.  Teleneurology involved.  Head CT performed and showed no acute changes.  MRI performed in follow up personally reviewed and shows multiple scattered areas of acute ischemia bilaterally and chronic microhemorrhages.    Date last known well: Unable to determine Time last known well: Unable to determine tPA Given: No: GIB, unable to determine LKW  Past Medical History:  Diagnosis Date  . Cancer [facility])    colon cancer   . Hemorrhoid   . Hypertension   . Thyroid disease     Past Surgical History:  Procedure Laterality Date  . COLON SURGERY    . FRACTURE SURGERY      Family History  Problem Relation Age of Onset  . Colon cancer Mother   . Diabetes Father   . Cancer Brother    Social History:  reports that she has never smoked. She has never used smokeless tobacco. She reports that she does not drink alcohol and does not use drugs.  Allergies: No Known Allergies  Medications:  I have reviewed the patient's current medications. Prior to Admission:  Medications Prior to Admission  Medication Sig Dispense Refill Last Dose  . alendronate (FOSAMAX) 70 MG tablet Take 1 tablet by mouth once a week.    Past Week at Unknown time  . amiodarone (PACERONE) 200 MG tablet Take 200 mg by mouth  daily at 6 (six) AM.   07/07/2019 at Unknown time  . apixaban (ELIQUIS) 5 MG TABS tablet Take 5 mg by mouth 2 (two) times daily.   07/07/2019 at Unknown time  . benazepril (LOTENSIN) 10 MG tablet Take 1 tablet (10 mg total) by mouth daily. 30 tablet 0 07/07/2019 at Unknown time  . donepezil (ARICEPT) 10 MG tablet Take 1 tablet by mouth 2 (two) times daily.   07/07/2019 at Unknown time  . ferrous sulfate 325 (65 FE) MG tablet Take 325 mg by mouth 2 (two) times daily with a meal.   07/07/2019 at Unknown time  . levothyroxine (SYNTHROID) 150 MCG tablet Take 1 tablet by mouth daily in the afternoon.   07/07/2019 at Unknown time  . memantine (NAMENDA) 10 MG tablet Take 10 mg by mouth 2 (two) times daily.   07/07/2019 at Unknown time  . metoprolol succinate (TOPROL-XL) 25 MG 24 hr tablet Take 25 mg by mouth daily at 6 (six) AM.   07/07/2019 at Unknown time  . senna-docusate (SENOKOT-S) 8.6-50 MG tablet Take 2 tablets by mouth at bedtime as needed for mild constipation or moderate constipation. 30 tablet 0 07/06/2019 at Unknown time   Scheduled: . donepezil  10 mg Oral BID  . levothyroxine  150 mcg Oral Q1500  . memantine  10 mg Oral BID  . pantoprazole (PROTONIX) IV  40 mg Intravenous Q12H  ROS: Unable to provide due to mental status  Physical Examination: Blood pressure 121/60, pulse 63, temperature 97.6 F (36.4 C), temperature source Oral, resp. rate (!) 24, height 5\' 4"  (1.626 m), weight 56.7 kg, SpO2 91 %.  HEENT-  Normocephalic, no lesions, without obvious abnormality.  Normal external eye and conjunctiva.  Normal TM's bilaterally.  Normal auditory canals and external ears. Normal external nose, mucus membranes and septum.  Normal pharynx. Cardiovascular- S1, S2 normal, pulses palpable throughout   Lungs- chest clear, no wheezing, rales, normal symmetric air entry Abdomen- soft, non-tender; bowel sounds normal; no masses,  no organomegaly Extremities- RUE edema Lymph-no adenopathy  palpable Musculoskeletal-no joint tenderness, deformity or swelling Skin-warm and dry, no hyperpigmentation, vitiligo, or suspicious lesions  Neurological Examination   Mental Status: Lethargic.  Oriented to name but not to place or time.  Follows simple commands.  Speech dysarthric but fluent.   Cranial Nerves: II: Blinks to bilateral confrontation.   III,IV, VI: ptosis not present, extra-ocular motions intact bilaterally V,VII: left facial droop, facial light touch sensation normal bilaterally VIII: hearing normal bilaterally IX,X: gag reflex reduced XI: unable to test XII: midline tongue extension Motor: 0/5 in the RUE.  Able to lift the left upper extremity and both lower extremities weakly.   Sensory: Pinprick and light touch intact throughout, bilaterally Deep Tendon Reflexes: Symmetric throughout Plantars: Right: mute   Left: mute Cerebellar: Unable to test due to weakness Gait: not tested due to safety concerns    Laboratory Studies:  Basic Metabolic Panel: Recent Labs  Lab 07/07/19 1201 07/08/19 0500 07/09/19 0546  NA 138 135 135  K 4.3 4.4 4.1  CL 105 106 108  CO2 17* 18* 18*  GLUCOSE 97 58* 105*  BUN 72* 74* 65*  CREATININE 2.93* 2.87* 2.51*  CALCIUM 9.2 8.5* 8.3*    Liver Function Tests: Recent Labs  Lab 07/07/19 1201 07/08/19 0500  AST 26 23  ALT 15 13  ALKPHOS 68 57  BILITOT 0.8 0.9  PROT 7.4 6.2*  ALBUMIN 3.1* 2.5*   No results for input(s): LIPASE, AMYLASE in the last 168 hours. No results for input(s): AMMONIA in the last 168 hours.  CBC: Recent Labs  Lab 07/07/19 1201 07/07/19 2042 07/08/19 0500 07/09/19 0546  WBC 12.1*  --  15.6* 16.0*  HGB 11.8* 10.4* 9.8* 9.7*  HCT 36.6 32.2* 30.3* 28.1*  MCV 89.9  --  89.6 86.2  PLT 316  --  226 214    Cardiac Enzymes: No results for input(s): CKTOTAL, CKMB, CKMBINDEX, TROPONINI in the last 168 hours.  BNP: Invalid input(s): POCBNP  CBG: Recent Labs  Lab 07/08/19 1609  07/08/19 2258 07/09/19 0639 07/09/19 1124 07/09/19 1552  GLUCAP 113* 93 95 74 80    Microbiology: Results for orders placed or performed during the hospital encounter of 07/07/19  SARS Coronavirus 2 by RT PCR (hospital order, performed in [facility] hospital lab) Nasopharyngeal Nasopharyngeal Swab     Status: None   Collection Time: 07/07/19  4:09 PM   Specimen: Nasopharyngeal Swab  Result Value Ref Range Status   SARS Coronavirus 2 NEGATIVE NEGATIVE Final    Comment: (NOTE) SARS-CoV-2 target nucleic acids are NOT DETECTED.  The SARS-CoV-2 RNA is generally detectable in upper and lower respiratory specimens during the acute phase of infection. The lowest concentration of SARS-CoV-2 viral copies this assay can detect is 250 copies / mL. A negative result does not preclude SARS-CoV-2 infection and should not be used as the  sole basis for treatment or other patient management decisions.  A negative result may occur with improper specimen collection / handling, submission of specimen other than nasopharyngeal swab, presence of viral mutation(s) within the areas targeted by this assay, and inadequate number of viral copies (<250 copies / mL). A negative result must be combined with clinical observations, patient history, and epidemiological information.  Fact Sheet for Patients:   StrictlyIdeas.no  Fact Sheet for Healthcare Providers: BankingDealers.co.za  This test is not yet approved or  cleared by the [place] FDA and has been authorized for detection and/or diagnosis of SARS-CoV-2 by FDA under an Emergency Use Authorization (EUA).  This EUA will remain in effect (meaning this test can be used) for the duration of the COVID-19 declaration under Section 564(b)(1) of the Act, 21 U.S.C. section 360bbb-3(b)(1), unless the authorization is terminated or revoked sooner.  Performed at [facility], [address]  [place], [place] [address]     Coagulation Studies: Recent Labs    07/07/19 1554 07/08/19 0500  LABPROT 16.5* 16.9*  INR 1.4* 1.4*    Urinalysis: No results for input(s): COLORURINE, LABSPEC, PHURINE, GLUCOSEU, HGBUR, BILIRUBINUR, KETONESUR, PROTEINUR, UROBILINOGEN, NITRITE, LEUKOCYTESUR in the last 168 hours.  Invalid input(s): APPERANCEUR  Lipid Panel:    Component Value Date/Time   CHOL 74 07/09/2019 0546   TRIG 54 07/09/2019 0546   HDL 25 (L) 07/09/2019 0546   CHOLHDL 3.0 07/09/2019 0546   VLDL 11 07/09/2019 0546   LDLCALC 38 07/09/2019 0546    HgbA1C:  Lab Results  Component Value Date   HGBA1C 6.2 (H) 07/09/2019    Urine Drug Screen:  No results found for: LABOPIA, COCAINSCRNUR, LABBENZ, AMPHETMU, THCU, LABBARB  Alcohol Level: No results for input(s): ETH in the last 168 hours.  Other results: EKG: atrial fibrillation, rate 106 bpm.  Imaging: CT ABDOMEN PELVIS WO CONTRAST  Result Date: 07/07/2019 CLINICAL DATA:  Weight loss and poor appetite. EXAM: CT ABDOMEN AND PELVIS WITHOUT CONTRAST TECHNIQUE: Multidetector CT imaging of the abdomen and pelvis was performed following the standard protocol without IV contrast. COMPARISON:  March 21, 2019 FINDINGS: Lower chest: Mild atelectasis is seen within the bilateral lung bases. There is a small right pleural effusion. Hepatobiliary: No focal liver abnormality is seen. Status post cholecystectomy. The common bile duct is dilated (0.9 cm). Pancreas: Unremarkable. No pancreatic ductal dilatation or surrounding inflammatory changes. Spleen: Normal in size without focal abnormality. Adrenals/Urinary Tract: Adrenal glands are unremarkable. The right kidney is atrophic in appearance. Mild compensatory hypertrophy of the left kidney is seen. A 2.7 cm cyst is seen within the mid right kidney. Multiple cysts are seen within the left kidney. The largest measures 4.0 cm in diameter and is located along the anterolateral aspect of the mid to  upper left kidney. Bladder is unremarkable. Stomach/Bowel: There is moderate severity diffuse gastric wall thickening. The appendix is not clearly identified. No evidence of bowel dilatation. A large amount of stool is seen within the distal sigmoid colon. Noninflamed diverticula are seen throughout the large bowel. Vascular/Lymphatic: There is moderate severity calcification of the abdominal aorta. No enlarged abdominal or pelvic lymph nodes. Reproductive: A 3.0 cm x 2.2 cm well-defined area of low attenuation is seen within the uterine fundus. Other: No abdominal wall hernia or abnormality. No abdominopelvic ascites. Musculoskeletal: Chronic compression fracture deformities are seen at the levels of T11, L4 and L5. IMPRESSION: 1. Moderate severity diffuse gastric wall thickening which may represent sequelae associated with an  acute gastritis. Further evaluation is recommended as an underlying neoplastic process cannot be excluded. 2. Small right pleural effusion. 3. Status post cholecystectomy. 4. Bilateral renal cysts. 5. Findings likely consistent with a fibroid uterus. 6. Large amount of stool within the distal sigmoid colon. 7. Chronic compression fracture deformities of the T11, L4 and L5 vertebral bodies. 8. Aortic atherosclerosis. Aortic Atherosclerosis (ICD10-I70.0). Electronically Signed   By: Virgina Norfolk M.D.   On: 07/07/2019 20:14   MR BRAIN WO CONTRAST  Result Date: 07/08/2019 CLINICAL DATA:  Neurologic deficit.  Possible acute infarct. EXAM: MRI HEAD WITHOUT CONTRAST TECHNIQUE: Multiplanar, multiecho pulse sequences of the brain and surrounding structures were obtained without intravenous contrast. COMPARISON:  03/21/2019 FINDINGS: Brain: There are numerous scattered small areas of acute ischemia, greatest in both cerebellar hemispheres. There are additional foci within all of the cerebral lobes, greatest in the frontal lobes. There is no midline shift or other mass effect. No acute  hemorrhage. Early confluent hyperintense T2-weighted signal of the periventricular and deep white matter, most commonly due to chronic ischemic microangiopathy. Normal midline structures. There are numerous chronic microhemorrhages in a predominantly central distribution. Vascular: Normal flow voids. Skull and upper cervical spine: Normal marrow signal. Sinuses/Orbits: Negative. Other: None. IMPRESSION: 1. Numerous scattered small areas of acute ischemia scattered throughout the brain, likely indicating a central embolic source. 2. Numerous chronic microhemorrhages in a predominantly central distribution, consistent with chronic hypertensive angiopathy. 3. Findings of chronic ischemic microangiopathy. Electronically Signed   By: Ulyses Jarred M.D.   On: 07/08/2019 21:05   CT HEAD CODE STROKE WO CONTRAST`  Result Date: 07/08/2019 CLINICAL DATA:  Right-sided weakness EXAM: CT HEAD WITHOUT CONTRAST TECHNIQUE: Contiguous axial images were obtained from the base of the skull through the vertex without intravenous contrast. COMPARISON:  05/27/2018 FINDINGS: Brain: No acute intracranial hemorrhage. Possible new subcentimeter focus of hypoattenuation involving the dorsal cortex of the left precentral gyrus medial to the hand motor region. There are scattered small chronic infarcts, largest within the left parietal lobe. Several chronic cerebellar infarcts are also again noted. There may be a new small left cerebellar infarct. Additional confluent areas of hypoattenuation in the supratentorial white matter are nonspecific but probably reflect stable chronic microvascular ischemic changes. Ventricles are stable in size. There is no extra-axial fluid collection. Vascular: No hyperdense vessel. Intracranial atherosclerotic calcification at the skull base. Skull: Unremarkable. Sinuses/Orbits: Aerated.  No acute orbital finding. Other: Mastoid air cells are clear. IMPRESSION: No acute intracranial hemorrhage. Question of small  age-indeterminate cortical infarct of the left precentral gyrus and small age-indeterminate left cerebellar infarct. Chronic findings detailed above. These results were called by telephone at the time of interpretation on 07/08/2019 at 5:30 pm to provider STANLEY LUE , who verbally acknowledged these results. Electronically Signed   By: Macy Mis M.D.   On: 07/08/2019 17:30    Assessment: 86 y.o. female with a medical history of colon cancer, Alzheimer's dementia, essential hypertension, history of depression, history of stroke in February of this year, who was living in a skilled nursing facility till past weekand came back homethis past Thursday. She has been cared for at home by her daughter. Was admitted with AKI and GIB.  There was some confusion at home concerning medications and Eliquis was not continued.  Was not reinitiated on admission due to GIB.  Yesterday was felt to have a change in mental status.  Teleneurology involved.  Head CT performed and showed no acute changes.  MRI performed in  follow up personally reviewed and shows multiple scattered areas of acute ischemia bilaterally and chronic microhemorrhages.  Etiology of acute infarcts likely cardioembolic but due to GIB patient not an anticoagulation candidate at this time.    Stroke Risk Factors - atrial fibrillation and hypertension  Plan: 1. Patient with a recent stroke work up.  Would not repeat at this time.   2. Would get GI clearance for stroke prophylaxis.  If unable to use Eliquis at this time, ASA is the next best alternative.  If unable to use either would await GI clearance. 3. PT/OT and speech therapy 4. Afib and rate control 5. Telemetry 6. Frequent neuro checks  Alexis Goodell, MD Neurology [phone] 07/09/2019, 3:55 PM

## 2019-07-09 NOTE — Progress Notes (Signed)
[facility] PROGRESS NOTE   Haley Brooks EYC:[id number] DOB: 06-Sep-1931 DOA: 07/07/2019  PCP: Kirk Ruths, MD  Brief History/Interval Summary: 86 y.o. female with a past medical history of colon cancer, Alzheimer's dementia, essential hypertension, history of depression, history of stroke in February of this year, who was living in a skilled nursing facility till past week and came back home this past Thursday.  She has been cared for at home by her daughter.  Most of the history was provided by the daughter.  Patient is a very poor historian due to her dementia.  According to the daughter patient was complaining of some nausea on Thursday and Friday.  For the nausea the daughter gave her medications similar to Pepto-Bismol although she is not certain.  It was recommended by a pharmacist at [facility].  Subsequently daughter noticed the patient was not having any bowel movements.  So she gave her a laxative which was followed by onset of multiple loose stools.  The stools were black in color.  Patient did not have any vomiting.  No fever or chills.  Patient denies any pain currently. Apparently she was experiencing extremely poor oral intake for past several weeks.  Apparently this has been the case even at the skilled nursing facility.  She was started on appetite stimulant while she was there.    In the emergency department stool was tested and was positive for occult blood.  It was black in color as per the ED provider.  Hemoglobin was noted to be stable however.  She was found to have acute kidney injury.  Patient's daughter also mentioned that patient had lost about 22 pounds in the last 3 to 4 months.  Patient denies any difficulty swallowing.  According to the daughter she is able to swallow her tablets and other medications without difficulties.  She just does not want to eat any food.   Reason for Visit: Acute kidney injury.  Questionable melanotic stools  Consultants:  Gastroenterology.  Neurology  Procedures: None yet  Antibiotics: Anti-infectives (From admission, onward)   None      Subjective/Interval History: Patient remains confused which is her baseline.  Poor historian due to her history of dementia.      Assessment/Plan:  Acute embolic stroke Yesterday's events noted.  Please review progress note from yesterday.  Patient was noted by physical therapist to have flaccid right upper extremity.  This was a new finding.  Time of onset was unclear.  Patient with previous history of embolic stroke.  Neurology consultation was obtained.  CT head did not show any acute findings.  MRI brain was done which shows multiple acute infarcts consistent with embolic source.  Echocardiogram is pending.  She had a carotid Doppler study few months ago which did not show any significant stenosis.  Neurology will follow the patient.  Unfortunately till her GI bleed issues have been sorted out we will have to continue to hold her Eliquis.  Patient has not been getting her Eliquis for the past week or so as patient's daughter did not realize that she was supposed to continue giving the patient this medication.  Acute kidney injury with metabolic acidosis This was most likely due to poor oral intake.  Her baseline creatinine seems to be around 1.3.  She presented with a creatinine of 2.93 and a BUN of 72.   She was started on IV fluids.  Labs are pending from this morning.  Monitor urine output.   CT  scan shows bilateral renal cysts but no evidence of obstruction.  Atrophic right kidney was noted.  Compensatory increase in size of the left kidney is noted.     Questionable melanotic stool Daughter mentioned black-colored stool.  Unclear as to the etiology for the same.  It looks like she is very constipated based on CT scan.  She was given laxatives and products similar to Pepto-Bismol.  CT scan also shows significantly thickened stomach wall which could be due to  gastritis.  Continue PPI.   Gastroenterology has seen the patient and the plan was to do an upper endoscopy.  The acute stroke might delay this.  Stool studies were canceled since the patient did not have any diarrhea in the hospital.    Acute blood loss anemia Hemoglobin did drop some which could be dilutional.  No overt bleeding has been noted.  Stable this morning.    History of anorexia and weight loss/thickened gastric wall CT scan findings as noted above.  Gastroenterology is following.  Hypoglycemia Likely due to n.p.o. status.  IV fluids had to be changed to D5.  Due to significant hypoglycemia we will continue the fluids despite her acute stroke.    History of paroxysmal atrial fibrillation Heart rate is stable.  Daughter was not giving the patient Eliquis after patient was released from skilled nursing facility last week as she did not realize that she was supposed to continue with the medication.  History of colon cancer in 2005 She is status post colectomy and chemotherapy previously.  History of Alzheimer's dementia Seems to be close to her baseline.  Continue Aricept and Namenda.  History of embolic stroke in February 2021 Does not seem to have significant deficits.  Need to verify her anticoagulation status.  Holding for now due to concern for bleeding.  Other findings on CT abdomen Fibroid uterus. Chronic compression fracture deformities of the T11, L4 and L5 vertebral bodies.  Bilateral renal cysts. This can be followed in the outpatient setting.  Goals of care Full CODE STATUS to continue after long discussion with daughter at the time of admission.  Now patient has sustained an acute stroke with right arm weakness.  She has nonspecific findings on CT scan which may need further evaluation.  There is concern for malignancy.  Will need to continue these conversations based on work-up and based on how the patient does over the next few days.   DVT Prophylaxis:  SCDs Code Status: Full code Family Communication: Daughter being updated daily. Disposition Plan:  Status is: Inpatient  Remains inpatient appropriate because:Altered mental status and Ongoing diagnostic testing needed not appropriate for outpatient work up   Dispo: The patient is from: Home              Anticipated d/c is to: To be determined              Anticipated d/c date is: 2 days              Patient currently is not medically stable to d/c.     Medications:  Scheduled: . donepezil  10 mg Oral BID  . levothyroxine  150 mcg Oral Q1500  . memantine  10 mg Oral BID  . pantoprazole (PROTONIX) IV  40 mg Intravenous Q12H   Continuous: . dextrose 5 % and 0.45% NaCl 75 mL/hr at 07/09/19 0359   RAQ:[id number] **OR** acetaminophen, metoprolol tartrate, ondansetron **OR** ondansetron (ZOFRAN) IV   Objective:  Vital Signs  Vitals:  07/08/19 2302 07/09/19 0532 07/09/19 0630 07/09/19 0849  BP: (!) 133/57 131/73 (!) 116/48 131/60  Pulse: (!) 42 (!) 129 (!) 43 63  Resp:  17  (!) 22  Temp: 99.5 F (37.5 C) (!) 97.5 F (36.4 C) 97.6 F (36.4 C) (!) 97.4 F (36.3 C)  TempSrc: Oral Oral Oral Oral  SpO2: 100% 98% 98% 98%  Weight:      Height:        Intake/Output Summary (Last 24 hours) at 07/09/2019 1020 Last data filed at 07/08/2019 2200 Gross per 24 hour  Intake 707.46 ml  Output --  Net 707.46 ml   Filed Weights   07/07/19 1150  Weight: 56.7 kg    General appearance: Awake alert.  In no distress.  Distracted Resp: Clear to auscultation bilaterally.  Normal effort Cardio: S1-S2 is normal regular.  No S3-S4.  No rubs murmurs or bruit GI: Abdomen is soft.  Nontender nondistended.  Bowel sounds are present normal.  No masses organomegaly Extremities: No edema.  Full range of motion of lower extremities. Neurologic: Disoriented.  At baseline.  Right arm weakness persists.  She is able to move her fingers a little bit.  She does have movement in her right leg.  The  left side appears to be stable.    Lab Results:  Data Reviewed: I have personally reviewed following labs and imaging studies  CBC: Recent Labs  Lab 07/07/19 1201 07/07/19 2042 07/08/19 0500 07/09/19 0546  WBC 12.1*  --  15.6* 16.0*  HGB 11.8* 10.4* 9.8* 9.7*  HCT 36.6 32.2* 30.3* 28.1*  MCV 89.9  --  89.6 86.2  PLT 316  --  226 025    Basic Metabolic Panel: Recent Labs  Lab 07/07/19 1201 07/08/19 0500  NA 138 135  K 4.3 4.4  CL 105 106  CO2 17* 18*  GLUCOSE 97 58*  BUN 72* 74*  CREATININE 2.93* 2.87*  CALCIUM 9.2 8.5*    GFR: Estimated Creatinine Clearance: 12.4 mL/min (A) (by C-G formula based on SCr of 2.87 mg/dL (H)).  Liver Function Tests: Recent Labs  Lab 07/07/19 1201 07/08/19 0500  AST 26 23  ALT 15 13  ALKPHOS 68 57  BILITOT 0.8 0.9  PROT 7.4 6.2*  ALBUMIN 3.1* 2.5*    Coagulation Profile: Recent Labs  Lab 07/07/19 1554 07/08/19 0500  INR 1.4* 1.4*    CBG: Recent Labs  Lab 07/08/19 0900 07/08/19 1222 07/08/19 1609 07/08/19 2258 07/09/19 0639  GLUCAP 42* 174* 113* 93 95     Recent Results (from the past 240 hour(s))  SARS Coronavirus 2 by RT PCR (hospital order, performed in [place] hospital lab) Nasopharyngeal Nasopharyngeal Swab     Status: None   Collection Time: 07/07/19  4:09 PM   Specimen: Nasopharyngeal Swab  Result Value Ref Range Status   SARS Coronavirus 2 NEGATIVE NEGATIVE Final    Comment: (NOTE) SARS-CoV-2 target nucleic acids are NOT DETECTED.  The SARS-CoV-2 RNA is generally detectable in upper and lower respiratory specimens during the acute phase of infection. The lowest concentration of SARS-CoV-2 viral copies this assay can detect is 250 copies / mL. A negative result does not preclude SARS-CoV-2 infection and should not be used as the sole basis for treatment or other patient management decisions.  A negative result may occur with improper specimen collection / handling, submission of specimen  other than nasopharyngeal swab, presence of viral mutation(s) within the areas targeted by this assay, and inadequate number  of viral copies (<250 copies / mL). A negative result must be combined with clinical observations, patient history, and epidemiological information.  Fact Sheet for Patients:   StrictlyIdeas.no  Fact Sheet for Healthcare Providers: BankingDealers.co.za  This test is not yet approved or  cleared by the [place] FDA and has been authorized for detection and/or diagnosis of SARS-CoV-2 by FDA under an Emergency Use Authorization (EUA).  This EUA will remain in effect (meaning this test can be used) for the duration of the COVID-19 declaration under Section 564(b)(1) of the Act, 21 U.S.C. section 360bbb-3(b)(1), unless the authorization is terminated or revoked sooner.  Performed at [facility], [address] [place], [place] [address]       Radiology Studies: CT ABDOMEN PELVIS WO CONTRAST  Result Date: 07/07/2019 CLINICAL DATA:  Weight loss and poor appetite. EXAM: CT ABDOMEN AND PELVIS WITHOUT CONTRAST TECHNIQUE: Multidetector CT imaging of the abdomen and pelvis was performed following the standard protocol without IV contrast. COMPARISON:  March 21, 2019 FINDINGS: Lower chest: Mild atelectasis is seen within the bilateral lung bases. There is a small right pleural effusion. Hepatobiliary: No focal liver abnormality is seen. Status post cholecystectomy. The common bile duct is dilated (0.9 cm). Pancreas: Unremarkable. No pancreatic ductal dilatation or surrounding inflammatory changes. Spleen: Normal in size without focal abnormality. Adrenals/Urinary Tract: Adrenal glands are unremarkable. The right kidney is atrophic in appearance. Mild compensatory hypertrophy of the left kidney is seen. A 2.7 cm cyst is seen within the mid right kidney. Multiple cysts are seen within the left kidney. The largest  measures 4.0 cm in diameter and is located along the anterolateral aspect of the mid to upper left kidney. Bladder is unremarkable. Stomach/Bowel: There is moderate severity diffuse gastric wall thickening. The appendix is not clearly identified. No evidence of bowel dilatation. A large amount of stool is seen within the distal sigmoid colon. Noninflamed diverticula are seen throughout the large bowel. Vascular/Lymphatic: There is moderate severity calcification of the abdominal aorta. No enlarged abdominal or pelvic lymph nodes. Reproductive: A 3.0 cm x 2.2 cm well-defined area of low attenuation is seen within the uterine fundus. Other: No abdominal wall hernia or abnormality. No abdominopelvic ascites. Musculoskeletal: Chronic compression fracture deformities are seen at the levels of T11, L4 and L5. IMPRESSION: 1. Moderate severity diffuse gastric wall thickening which may represent sequelae associated with an acute gastritis. Further evaluation is recommended as an underlying neoplastic process cannot be excluded. 2. Small right pleural effusion. 3. Status post cholecystectomy. 4. Bilateral renal cysts. 5. Findings likely consistent with a fibroid uterus. 6. Large amount of stool within the distal sigmoid colon. 7. Chronic compression fracture deformities of the T11, L4 and L5 vertebral bodies. 8. Aortic atherosclerosis. Aortic Atherosclerosis (ICD10-I70.0). Electronically Signed   By: Virgina Norfolk M.D.   On: 07/07/2019 20:14   MR BRAIN WO CONTRAST  Result Date: 07/08/2019 CLINICAL DATA:  Neurologic deficit.  Possible acute infarct. EXAM: MRI HEAD WITHOUT CONTRAST TECHNIQUE: Multiplanar, multiecho pulse sequences of the brain and surrounding structures were obtained without intravenous contrast. COMPARISON:  03/21/2019 FINDINGS: Brain: There are numerous scattered small areas of acute ischemia, greatest in both cerebellar hemispheres. There are additional foci within all of the cerebral lobes, greatest  in the frontal lobes. There is no midline shift or other mass effect. No acute hemorrhage. Early confluent hyperintense T2-weighted signal of the periventricular and deep white matter, most commonly due to chronic ischemic microangiopathy. Normal midline structures. There are numerous  chronic microhemorrhages in a predominantly central distribution. Vascular: Normal flow voids. Skull and upper cervical spine: Normal marrow signal. Sinuses/Orbits: Negative. Other: None. IMPRESSION: 1. Numerous scattered small areas of acute ischemia scattered throughout the brain, likely indicating a central embolic source. 2. Numerous chronic microhemorrhages in a predominantly central distribution, consistent with chronic hypertensive angiopathy. 3. Findings of chronic ischemic microangiopathy. Electronically Signed   By: Ulyses Jarred M.D.   On: 07/08/2019 21:05   CT HEAD CODE STROKE WO CONTRAST`  Result Date: 07/08/2019 CLINICAL DATA:  Right-sided weakness EXAM: CT HEAD WITHOUT CONTRAST TECHNIQUE: Contiguous axial images were obtained from the base of the skull through the vertex without intravenous contrast. COMPARISON:  05/27/2018 FINDINGS: Brain: No acute intracranial hemorrhage. Possible new subcentimeter focus of hypoattenuation involving the dorsal cortex of the left precentral gyrus medial to the hand motor region. There are scattered small chronic infarcts, largest within the left parietal lobe. Several chronic cerebellar infarcts are also again noted. There may be a new small left cerebellar infarct. Additional confluent areas of hypoattenuation in the supratentorial white matter are nonspecific but probably reflect stable chronic microvascular ischemic changes. Ventricles are stable in size. There is no extra-axial fluid collection. Vascular: No hyperdense vessel. Intracranial atherosclerotic calcification at the skull base. Skull: Unremarkable. Sinuses/Orbits: Aerated.  No acute orbital finding. Other: Mastoid air  cells are clear. IMPRESSION: No acute intracranial hemorrhage. Question of small age-indeterminate cortical infarct of the left precentral gyrus and small age-indeterminate left cerebellar infarct. Chronic findings detailed above. These results were called by telephone at the time of interpretation on 07/08/2019 at 5:30 pm to provider STANLEY LUE , who verbally acknowledged these results. Electronically Signed   By: Macy Mis M.D.   On: 07/08/2019 17:30       LOS: 2 days   [facility] Pager on [email or web address]  07/09/2019, 10:20 AM

## 2019-07-09 NOTE — Progress Notes (Signed)
SLP Cancellation Note  Patient Details Name: Haley Brooks MRN: [id number] DOB: [birth year]   Cancelled treatment:       Reason Eval/Treat Not Completed: SLP screened, no needs identified, will sign off   Pt with baseline cognitive deficits that are not acute.   Samaj Wessells B. Rutherford Nail M.S., CCC-SLP, [place] Office [phone]    Dmonte Maher Rutherford Nail 07/09/2019, 8:29 AM

## 2019-07-10 DIAGNOSIS — Z7189 Other specified counseling: Secondary | ICD-10-CM

## 2019-07-10 DIAGNOSIS — Z515 Encounter for palliative care: Secondary | ICD-10-CM

## 2019-07-10 DIAGNOSIS — L8990 Pressure ulcer of unspecified site, unspecified stage: Secondary | ICD-10-CM | POA: Insufficient documentation

## 2019-07-10 LAB — BASIC METABOLIC PANEL
Anion gap: 6 (ref 5–15)
BUN: 58 mg/dL — ABNORMAL HIGH (ref 8–23)
CO2: 20 mmol/L — ABNORMAL LOW (ref 22–32)
Calcium: 8.5 mg/dL — ABNORMAL LOW (ref 8.9–10.3)
Chloride: 111 mmol/L (ref 98–111)
Creatinine, Ser: 2.19 mg/dL — ABNORMAL HIGH (ref 0.44–1.00)
GFR calc Af Amer: 23 mL/min — ABNORMAL LOW (ref >60)
GFR calc non Af Amer: 20 mL/min — ABNORMAL LOW (ref >60)
Glucose, Bld: 120 mg/dL — ABNORMAL HIGH (ref 70–99)
Potassium: 3.8 mmol/L (ref 3.5–5.1)
Sodium: 137 mmol/L (ref 135–145)

## 2019-07-10 LAB — CBC
HCT: 28.6 % — ABNORMAL LOW (ref 36.0–46.0)
Hemoglobin: 9.4 g/dL — ABNORMAL LOW (ref 12.0–15.0)
MCH: 29.2 pg (ref 26.0–34.0)
MCHC: 32.9 g/dL (ref 30.0–36.0)
MCV: 88.8 fL (ref 80.0–100.0)
Platelets: 214 10*3/uL (ref 150–400)
RBC: 3.22 MIL/uL — ABNORMAL LOW (ref 3.87–5.11)
RDW: 14.6 % (ref 11.5–15.5)
WBC: 13 10*3/uL — ABNORMAL HIGH (ref 4.0–10.5)
nRBC: 0 % (ref 0.0–0.2)

## 2019-07-10 LAB — GLUCOSE, CAPILLARY
Glucose-Capillary: 104 mg/dL — ABNORMAL HIGH (ref 70–99)
Glucose-Capillary: 133 mg/dL — ABNORMAL HIGH (ref 70–99)

## 2019-07-10 MED ORDER — LORAZEPAM 2 MG/ML IJ SOLN: 0.5000 mg | INTRAMUSCULAR | Status: DC | PRN

## 2019-07-10 MED ORDER — ONDANSETRON HCL 4 MG/2ML IJ SOLN: 4.0000 mg | Freq: Four times a day (QID) | INTRAMUSCULAR | Status: DC | PRN

## 2019-07-10 MED ORDER — ACETAMINOPHEN 325 MG PO TABS: 650.0000 mg | ORAL_TABLET | Freq: Four times a day (QID) | ORAL | Status: DC | PRN

## 2019-07-10 MED ORDER — LORAZEPAM 1 MG PO TABS: 1.0000 mg | ORAL_TABLET | ORAL | Status: DC | PRN

## 2019-07-10 MED ORDER — HALOPERIDOL LACTATE 5 MG/ML IJ SOLN: 0.5000 mg | INTRAMUSCULAR | Status: DC | PRN

## 2019-07-10 MED ORDER — METOPROLOL TARTRATE 5 MG/5ML IV SOLN
2.5000 mg | INTRAVENOUS | Status: AC
  Administered 2019-07-10: 14:00:00 2.5 mg via INTRAVENOUS
  Filled 2019-07-10: qty 5

## 2019-07-10 MED ORDER — APIXABAN 2.5 MG PO TABS
2.5000 mg | ORAL_TABLET | Freq: Two times a day (BID) | ORAL | Status: DC
  Administered 2019-07-10: 14:00:00 2.5 mg via ORAL
  Filled 2019-07-10: qty 1

## 2019-07-10 MED ORDER — HYDROMORPHONE HCL 1 MG/ML IJ SOLN: 0.2500 mg | INTRAMUSCULAR | Status: DC | PRN

## 2019-07-10 MED ORDER — APIXABAN 5 MG PO TABS: 5.0000 mg | ORAL_TABLET | Freq: Two times a day (BID) | ORAL | Status: DC

## 2019-07-10 MED ORDER — GLYCOPYRROLATE 0.2 MG/ML IJ SOLN: 0.2000 mg | INTRAMUSCULAR | Status: DC | PRN

## 2019-07-10 MED ORDER — SODIUM CHLORIDE 0.9% FLUSH
3.0000 mL | Freq: Two times a day (BID) | INTRAVENOUS | Status: DC
  Administered 2019-07-11 – 2019-07-13 (×6): 3 mL via INTRAVENOUS

## 2019-07-10 MED ORDER — SODIUM CHLORIDE 0.9% FLUSH: 3.0000 mL | INTRAVENOUS | Status: DC | PRN

## 2019-07-10 MED ORDER — HALOPERIDOL LACTATE 2 MG/ML PO CONC
0.5000 mg | ORAL | Status: DC | PRN
  Filled 2019-07-10: qty 0.3

## 2019-07-10 MED ORDER — ONDANSETRON 4 MG PO TBDP
4.0000 mg | ORAL_TABLET | Freq: Four times a day (QID) | ORAL | Status: DC | PRN
  Filled 2019-07-10: qty 1

## 2019-07-10 MED ORDER — POLYVINYL ALCOHOL 1.4 % OP SOLN
1.0000 [drp] | Freq: Four times a day (QID) | OPHTHALMIC | Status: DC | PRN
  Filled 2019-07-10: qty 15

## 2019-07-10 MED ORDER — GLYCOPYRROLATE 1 MG PO TABS
1.0000 mg | ORAL_TABLET | ORAL | Status: DC | PRN
  Filled 2019-07-10: qty 1

## 2019-07-10 MED ORDER — ACETAMINOPHEN 650 MG RE SUPP: 650.0000 mg | Freq: Four times a day (QID) | RECTAL | Status: DC | PRN

## 2019-07-10 MED ORDER — LORAZEPAM 2 MG/ML PO CONC: 1.0000 mg | ORAL | Status: DC | PRN

## 2019-07-10 MED ORDER — BIOTENE DRY MOUTH MT LIQD: 15.0000 mL | OROMUCOSAL | Status: DC | PRN

## 2019-07-10 MED ORDER — HALOPERIDOL 0.5 MG PO TABS
0.5000 mg | ORAL_TABLET | ORAL | Status: DC | PRN
  Filled 2019-07-10: qty 1

## 2019-07-10 MED ORDER — METOPROLOL TARTRATE 50 MG PO TABS
50.0000 mg | ORAL_TABLET | Freq: Two times a day (BID) | ORAL | Status: DC
  Administered 2019-07-10 – 2019-07-13 (×6): 50 mg via ORAL
  Filled 2019-07-10 (×6): qty 1

## 2019-07-10 MED ORDER — ORAL CARE MOUTH RINSE
15.0000 mL | Freq: Two times a day (BID) | OROMUCOSAL | Status: DC
  Administered 2019-07-10 – 2019-07-13 (×7): 15 mL via OROMUCOSAL

## 2019-07-10 NOTE — Progress Notes (Signed)
OT Cancellation Note  Patient Details Name: Haley Brooks MRN: [id number] DOB: 11/10/1931   Cancelled Treatment:    Reason Eval/Treat Not Completed: Other (comment). Received communication from Physical therapist that pt will be going comfort care. Therapy has been asked to complete orders at this time. Will complete OT orders. It has been a pleasure participating in this pt's care. Please re-consult OT services if goals of care change. Thank you.   Shara Blazing, M.S., OTR/L Ascom: [phone] 07/10/19, 4:35 PM

## 2019-07-10 NOTE — Care Management Important Message (Signed)
Important Message  Patient Details  Name: Haley Brooks MRN: [id number] Date of Birth: Feb 19, 1931   Medicare Important Message Given:  Yes     Juliann Pulse A Christen Wardrop 07/10/2019, 11:04 AM

## 2019-07-10 NOTE — Progress Notes (Addendum)
[facility]) Hospital Liaison RN note:  Received request from Asencion Gowda, NP for family interest in [place]. Chart reviewed and eligibility is approved.  Spoke with daughter, Sherre Scarlet, in the room to acknowledge referral and initiate education related to hospice philosophy and services provided. Mateo Flow voiced understanding.  Unfortunately, [facility]ospice [facility] is not able to offer a room today. Hospital care team and daughter is aware. [facility] RN will continue to follow for bed availability.  Please call with any hospice related questions or concerns.  Thank you for the opportunity to participate in this patient's care.  Zandra Abts, RN [facility] Liaison [phone]

## 2019-07-10 NOTE — Progress Notes (Signed)
Physical Therapy Treatment Patient Details Name: Haley Brooks MRN: [id number] DOB: May 31, 1931 Today's Date: 07/10/2019    History of Present Illness Haley Brooks is an 86 y.o. female with PMH of colon canzer, Alzheimer's dementia, HTN, depression, and prior stroke (February 2021) with subsequent STR stay. Pt had discharged home from STR earlier in the week. She presents to [facility] ED with c/o of black stools. MD assessment includes: AKI, melanotic stool, acute blood loss anemia, anorexia, hypoglycemia, afib, and chronic compression fracture deformities of the T11, L4 and L5 vertebral bodies. During admission, pt noted with acute R sided weakness. Code stroke was called. MRI revealed: numerous scattered small areas of acute ischemia likely indicating a central embolic source.    PT Comments    Pt presents today lethargic with a flat affect. Pt was encouraged to complete bed mobility exercises, including: ankle pumps, quad sets, and SLR with max active assist provided by PT. Pt had minimal ability to follow tactile and verbal cues, but after a few repetitions was able to provide minimal muscle activation.  PROM provided to assist in preventing contractures. At end of session palliative nursing entered room and stated that pt likely transitioning to comfort care at this time.  Per nursing request, PT orders to be completed.  Will complete PT orders at this time but will reassess pt pending a change in status upon receipt of new PT orders.    Follow Up Recommendations  Home health PT;Supervision/Assistance - 24 hour     Equipment Recommendations       Recommendations for Other Services       Precautions / Restrictions Precautions Precautions: Fall Restrictions Weight Bearing Restrictions: No    Mobility  Bed Mobility Overal bed mobility: Needs Assistance             General bed mobility comments: Mobility deferred for pt safety/comfort.  Transfers                     Ambulation/Gait                 Stairs             Wheelchair Mobility    Modified Rankin (Stroke Patients Only)       Balance                                            Cognition Arousal/Alertness: Lethargic Behavior During Therapy: Flat affect Overall Cognitive Status: History of cognitive impairments - at baseline                                 General Comments: significatly decreased ability to follow verbal and tactile cues. Is able to appropriately answer basic questions      Exercises Total Joint Exercises Ankle Circles/Pumps: AAROM;Both;10 reps Quad Sets: Strengthening;Both;10 reps (slight contractions with tactile cueing) Straight Leg Raises: AAROM;Strengthening;10 reps;Both    General Comments        Pertinent Vitals/Pain Pain Assessment: Faces Faces Pain Scale: Hurts a little bit Pain Intervention(s): Monitored during session    Home Living                      Prior Function            PT Goals (current  goals can now be found in the care plan section) Progress towards PT goals: Not progressing toward goals - comment    Frequency    Min 2X/week      PT Plan Current plan remains appropriate    Co-evaluation              AM-PAC PT "6 Clicks" Mobility   Outcome Measure  Help needed turning from your back to your side while in a flat bed without using bedrails?: Total Help needed moving from lying on your back to sitting on the side of a flat bed without using bedrails?: Total Help needed moving to and from a bed to a chair (including a wheelchair)?: Total Help needed standing up from a chair using your arms (e.g., wheelchair or bedside chair)?: Total Help needed to walk in hospital room?: Total Help needed climbing 3-5 steps with a railing? : Total 6 Click Score: 6    End of Session   Activity Tolerance: Patient limited by lethargy Patient left: in bed;with call  bell/phone within reach;with bed alarm set;with family/visitor present Nurse Communication: Mobility status PT Visit Diagnosis: Muscle weakness (generalized) (M62.81);Hemiplegia and hemiparesis;Difficulty in walking, not elsewhere classified (R26.2) Hemiplegia - Right/Left: Right Hemiplegia - dominant/non-dominant: Dominant Hemiplegia - caused by: Cerebral infarction     Time: 4098-1191 PT Time Calculation (min) (ACUTE ONLY): 12 min  Charges:  $Therapeutic Exercise: 8-22 mins                     D. Scott Mikhi Athey PT, DPT 07/10/19, 4:25 PM

## 2019-07-10 NOTE — Consult Note (Addendum)
Consultation Note Date: 07/10/2019   Patient Name: Haley Brooks  DOB: Feb 16, 1931  MRN: [id number]  Age / Sex: 86 y.o., female  PCP: Kirk Ruths, MD Referring Physician: Bonnielee Haff, MD  Reason for Consultation: Establishing goals of care  HPI/Patient Profile:Haley Brooks is a 86 y.o. female with a past medical history of colon cancer, Alzheimer's dementia, essential hypertension, history of depression, history of stroke in February of this year, who was living in a skilled nursing facility till past week and came back home this past Thursday.  She has been cared for at home by her daughter.  Clinical Assessment and Goals of Care: Patient is resting in bed. She does not arouse to voice or light touch. Even and unlabored respirations.   Spoke with daughter on phone. Patient is widowed with 2/3 children deceased.Daughter discusses her mother's CVA and SNF placement since February. She discusses the UTI's that have also occurred during that time. She discusses her decline. Prior to her CVA in February, she needed help managing medicationss, but was independent outside of this. She took pride in her independence and ability to have control over situations.  We discussed her diagnoses, prognosis, GOC, EOL wishes disposition and options.  A detailed discussion was had today regarding advanced directives.  Concepts specific to code status, artifical feeding and hydration, IV antibiotics and rehospitalization were discussed.  The difference between an aggressive medical intervention path and a comfort care path was discussed.  Values and goals of care important to patient and family were attempted to be elicited.  Discussed limitations of medical interventions to prolong quality of life in some situations and discussed the concept of human mortality.  Daughter states her mother would not want to  live as she is, and her QOL has not been acceptable. She states "I would be selfish to keep her here this way. She's not comfortable." She states her mother is a woman of faith, and that "we live to die". She would like to transition her mother to comfort care for what time she has left.   Daughter in to bedside. Patient is awake. 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 She does state she is not feeling well. Comfort focused care was described, and she states she wants to be comfortable until death.    I completed a MOST form today and the signed by daughter, original was placed in the chart. A photocopy was placed in the chart to be scanned into EMR. The patient outlined their wishes for the following treatment decisions:  Cardiopulmonary Resuscitation: Do Not Attempt Resuscitation (DNR/No CPR)  Medical Interventions: Comfort Measures: Keep clean, warm, and dry. Use medication by any route, positioning, wound care, and other measures to relieve pain and suffering. Use oxygen, suction and manual treatment of airway obstruction as needed for comfort. Do not transfer to the hospital unless comfort needs cannot be met in current location.  Antibiotics: No antibiotics (use other measures to relieve symptoms)  IV Fluids: No IV fluids (provide other measures to ensure comfort)  Feeding Tube: No feeding tube      SUMMARY OF RECOMMENDATIONS   Transition to comfort care.    Prognosis:   < 2 weeks Very poor PO intake. CVA.       Primary Diagnoses: Present on Admission: . Weight loss . Alzheimer's dementia ([place]) . Anemia due to stage 4 chronic kidney disease ([place]) . AF (paroxysmal atrial fibrillation) ([place]) . Essential hypertension . CVA (cerebral vascular accident) ([place]) . History of colon cancer . AKI (acute kidney injury) ([place]) . Melena   I have reviewed the medical record, interviewed the patient  and family, and examined the patient. The following aspects are pertinent.  Past Medical History:  Diagnosis Date  . Cancer [facility])    colon cancer   . Hemorrhoid   . Hypertension   . Thyroid disease    Social History   Socioeconomic History  . Marital status: Married    Spouse name: Not on file  . Number of children: Not on file  . Years of education: Not on file  . Highest education level: Not on file  Occupational History  . Not on file  Tobacco Use  . Smoking status: Never Smoker  . Smokeless tobacco: Never Used  Vaping Use  . Vaping Use: Never used  Substance and Sexual Activity  . Alcohol use: No  . Drug use: No  . Sexual activity: Never  Other Topics Concern  . Not on file  Social History Narrative  . Not on file   Social Determinants of Health   Financial Resource Strain:   . Difficulty of Paying Living Expenses:   Food Insecurity:   . Worried About Charity fundraiser in the Last Year:   . Arboriculturist in the Last Year:   Transportation Needs:   . Film/video editor (Medical):   Marland Kitchen Lack of Transportation (Non-Medical):   Physical Activity:   . Days of Exercise per Week:   . Minutes of Exercise per Session:   Stress:   . Feeling of Stress :   Social Connections:   . Frequency of Communication with Friends and Family:   . Frequency of Social Gatherings with Friends and Family:   . Attends Religious Services:   . Active Member of Clubs or Organizations:   . Attends Archivist Meetings:   Marland Kitchen Marital Status:    Family History  Problem Relation Age of Onset  . Colon cancer Mother   . Diabetes Father   . Cancer Brother    Scheduled Meds: . apixaban  2.5 mg Oral BID  . donepezil  10 mg Oral BID  . levothyroxine  150 mcg Oral Q1500  . mouth rinse  15 mL Mouth Rinse BID  . memantine  10 mg Oral BID  . metoprolol tartrate  50 mg Oral BID  . pantoprazole (PROTONIX) IV  40 mg Intravenous Q12H   Continuous Infusions: . dextrose 5 % and  0.45% NaCl 75 mL/hr at 07/10/19 1125   PRN Meds:.acetaminophen **OR** acetaminophen, metoprolol tartrate, ondansetron **OR** ondansetron (ZOFRAN) IV Medications Prior to Admission:  Prior to Admission medications   Medication Sig Start Date End Date Taking? Authorizing Provider  alendronate (FOSAMAX) 70 MG tablet Take 1 tablet by mouth once a week.  03/25/15  Yes [provider]  amiodarone (PACERONE) 200 MG tablet Take 200 mg by mouth  daily at 6 (six) AM.   Yes [provider]  apixaban (ELIQUIS) 5 MG TABS tablet Take 5 mg by mouth 2 (two) times daily.   Yes [provider]  benazepril (LOTENSIN) 10 MG tablet Take 1 tablet (10 mg total) by mouth daily. 03/23/19  Yes Nolberto Hanlon, MD  donepezil (ARICEPT) 10 MG tablet Take 1 tablet by mouth 2 (two) times daily. 12/21/16  Yes [provider]  ferrous sulfate 325 (65 FE) MG tablet Take 325 mg by mouth 2 (two) times daily with a meal.   Yes [provider]  levothyroxine (SYNTHROID) 150 MCG tablet Take 1 tablet by mouth daily in the afternoon. 12/31/18  Yes [provider]  memantine (NAMENDA) 10 MG tablet Take 10 mg by mouth 2 (two) times daily. 12/31/18  Yes [provider]  metoprolol succinate (TOPROL-XL) 25 MG 24 hr tablet Take 25 mg by mouth daily at 6 (six) AM.   Yes [provider]  senna-docusate (SENOKOT-S) 8.6-50 MG tablet Take 2 tablets by mouth at bedtime as needed for mild constipation or moderate constipation. 03/23/19  Yes Nolberto Hanlon, MD   No Known Allergies Review of Systems  Constitutional:       Says she feels bad.     Physical Exam Pulmonary:     Effort: Pulmonary effort is normal.  Neurological:     Mental Status: She is alert.     Vital Signs: BP (!) 140/94 (BP Location: Left Arm)   Pulse 66   Temp 97.8 F (36.6 C) (Axillary)   Resp 16   Ht _0  (1.626 m)   Wt 56.7 kg   SpO2 100%   BMI 21.46 kg/m  Pain Scale: 0-10   Pain Score: 0-No  pain   SpO2: SpO2: 100 % O2 Device:SpO2: 100 % O2 Flow Rate: .   IO: Intake/output summary: No intake or output data in the 24 hours ending 07/10/19 1551  LBM: Last BM Date: 07/09/19 Baseline Weight: Weight: 56.7 kg Most recent weight: Weight: 56.7 kg     Palliative Assessment/Data: 20%     Time In: 3:25 Time Out: 4:35 Time Total: 70 min Greater than 50%  of this time was spent counseling and coordinating care related to the above assessment and plan.  Signed by: Asencion Gowda, NP   Please contact Palliative Medicine Team phone at [phone] for questions and concerns.  For individual provider: See Shea Evans

## 2019-07-10 NOTE — Progress Notes (Signed)
[facility] PROGRESS NOTE   Haley Brooks MLY:[id number] DOB: 1931/03/28 DOA: 07/07/2019  PCP: Kirk Ruths, MD  Brief History/Interval Summary: 86 y.o. female with a past medical history of colon cancer, Alzheimer's dementia, essential hypertension, history of depression, history of stroke in February of this year, who was living in a skilled nursing facility till past week and came back home this past Thursday.  She has been cared for at home by her daughter.  Most of the history was provided by the daughter.  Patient is a very poor historian due to her dementia.  According to the daughter patient was complaining of some nausea on Thursday and Friday.  For the nausea the daughter gave her medications similar to Pepto-Bismol although she is not certain.  It was recommended by a pharmacist at [facility].  Subsequently daughter noticed the patient was not having any bowel movements.  So she gave her a laxative which was followed by onset of multiple loose stools.  The stools were black in color.  Patient did not have any vomiting.  No fever or chills.  Patient denies any pain currently. Apparently she was experiencing extremely poor oral intake for past several weeks.  Apparently this has been the case even at the skilled nursing facility.  She was started on appetite stimulant while she was there.    In the emergency department stool was tested and was positive for occult blood.  It was black in color as per the ED provider.  Hemoglobin was noted to be stable however.  She was found to have acute kidney injury.  Patient's daughter also mentioned that patient had lost about 22 pounds in the last 3 to 4 months.  Patient denies any difficulty swallowing.  According to the daughter she is able to swallow her tablets and other medications without difficulties.  She just does not want to eat any food.   Reason for Visit: Acute kidney injury.  Questionable melanotic stools  Consultants:    Gastroenterology.   Neurology Palliative medicine  Procedures: None yet  Antibiotics: Anti-infectives (From admission, onward)   None      Subjective/Interval History: Patient remains confused and distracted which is her baseline.  Denies any complaints.  Not very communicative.  Poor historian due to dementia.    Assessment/Plan:  Acute embolic stroke Patient noted to have right arm weakness on 7/7.  Neurological work-up was done.  MRI shows acute embolic stroke.  This is her second stroke in the last 3 months.  Neurology was consulted.  Echocardiogram has been done as well as below.  Normal systolic function noted.  No obvious clots are appreciated.  Carotid Doppler studies done a few months ago did not show any significant stenosis. Patient had been off of her Eliquis since she was discharged from her skilled nursing facility last week.  This is because daughter did not realize that she was supposed to be taking this medication.  Plus she presented to the hospital with concerns for GI bleed. Patient seen by gastroenterology as discussed below.  They do not plan any endoscopy at this time due to stable hemoglobins.  They are okay with resuming anticoagulation.  This was discussed with patient's daughter who is agreeable with this plan.  Eliquis to be resumed.  Waiting on swallow evaluation.    Acute kidney injury with metabolic acidosis This was most likely due to poor oral intake.  Her baseline creatinine seems to be around 1.3.  She presented with  a creatinine of 2.93 and a BUN of 72.   She was started on IV fluids.  Renal function has been slowly improving.  BUN is down to 58 and creatinine is 2.19.  Monitor urine output. CT scan shows bilateral renal cysts but no evidence of obstruction.  Atrophic right kidney was noted.  Compensatory increase in size of the left kidney is noted.     Questionable melanotic stool Daughter mentioned black-colored stool.  Unclear as to the etiology  for the same.  It looks like she is very constipated based on CT scan.  She was given laxatives and products similar to Pepto-Bismol.  CT scan also shows significantly thickened stomach wall which could be due to gastritis.  Patient started on PPI which is being continued. Gastroenterology has been following.  At this time they do not have any plans to do upper endoscopy especially considering her acute stroke.  They have cleared the patient to resume anticoagulation.  Her hemoglobin has been stable the last 3 days.  No overt bleeding has been noted here in the hospital.  Acute blood loss anemia Hemoglobin did drop initially which could be dilutional.  No overt bleeding has been noted.  Hemoglobin has been stable.  History of anorexia and weight loss/thickened gastric wall CT scan findings as noted above.  Gastroenterology is following..  Hypoglycemia Likely due to n.p.o. status.  IV fluids had to be changed to D5.  Due to significant hypoglycemia we will continue the fluids despite her acute stroke.    History of paroxysmal atrial fibrillation Heart rate is stable.  Daughter was not giving the patient Eliquis after patient was released from skilled nursing facility last week as she did not realize that she was supposed to continue with the medication. Eliquis to be resumed today.  History of colon cancer in 2005 She is status post colectomy and chemotherapy previously.  History of Alzheimer's dementia Seems to be close to her baseline.  Continue Aricept and Namenda.  Other findings on CT abdomen Fibroid uterus. Chronic compression fracture deformities of the T11, L4 and L5 vertebral bodies.  Bilateral renal cysts. This can be followed in the outpatient setting.  Goals of care Full CODE STATUS to continue after long discussion with daughter at the time of admission.  Now patient has sustained an acute stroke with right arm weakness.  She has nonspecific findings on CT scan which may need  further evaluation.  There is concern for malignancy.   Patient has had very poor oral intake with significant weight loss over the last few months.  All this portends poor prognosis.  This was all discussed with the patient's daughter.  We will consult palliative medicine to assist with goals of care.    DVT Prophylaxis: SCDs Code Status: Full code Family Communication: Daughter being updated daily. Disposition Plan:  Status is: Inpatient  Remains inpatient appropriate because:Altered mental status and Inpatient level of care appropriate due to severity of illness   Dispo:  Patient From: Home  Planned Disposition: To be determined  Expected discharge date: 07/11/19  Medically stable for discharge: No       Medications:  Scheduled: . apixaban  2.5 mg Oral BID  . donepezil  10 mg Oral BID  . levothyroxine  150 mcg Oral Q1500  . memantine  10 mg Oral BID  . pantoprazole (PROTONIX) IV  40 mg Intravenous Q12H   Continuous: . dextrose 5 % and 0.45% NaCl 75 mL/hr at 07/10/19 [phone]  RSW:[id number] **OR** acetaminophen, metoprolol tartrate, ondansetron **OR** ondansetron (ZOFRAN) IV   Objective:  Vital Signs  Vitals:   07/09/19 1928 07/09/19 2346 07/10/19 0444 07/10/19 0752  BP: (!) 146/64 (!) 130/59 (!) 151/110 (!) 145/64  Pulse: (!) 101 (!) 58 70 67  Resp: 14 15 17 18   Temp: 97.7 F (36.5 C) 97.8 F (36.6 C) 97.7 F (36.5 C) 98.8 F (37.1 C)  TempSrc:      SpO2: 100% 98% 100% 96%  Weight:      Height:       No intake or output data in the 24 hours ending 07/10/19 1018 Filed Weights   07/07/19 1150  Weight: 56.7 kg    General appearance: Awake alert.  In no distress.  Very distracted Resp: Clear to auscultation bilaterally.  Normal effort Cardio: S1-S2 is normal regular.  No S3-S4.  No rubs murmurs or bruit GI: Abdomen is soft.  Nontender nondistended.  Bowel sounds are present normal.  No masses organomegaly Extremities: No edema.  Full range of motion of  lower extremities. Neurologic: Disoriented.  Not moving her right arm.  Other exam is stable.     Lab Results:  Data Reviewed: I have personally reviewed following labs and imaging studies  CBC: Recent Labs  Lab 07/07/19 1201 07/07/19 2042 07/08/19 0500 07/09/19 0546 07/10/19 0608  WBC 12.1*  --  15.6* 16.0* 13.0*  HGB 11.8* 10.4* 9.8* 9.7* 9.4*  HCT 36.6 32.2* 30.3* 28.1* 28.6*  MCV 89.9  --  89.6 86.2 88.8  PLT 316  --  226 214 182    Basic Metabolic Panel: Recent Labs  Lab 07/07/19 1201 07/08/19 0500 07/09/19 0546 07/10/19 0608  NA 138 135 135 137  K 4.3 4.4 4.1 3.8  CL 105 106 108 111  CO2 17* 18* 18* 20*  GLUCOSE 97 58* 105* 120*  BUN 72* 74* 65* 58*  CREATININE 2.93* 2.87* 2.51* 2.19*  CALCIUM 9.2 8.5* 8.3* 8.5*    GFR: Estimated Creatinine Clearance: 16.2 mL/min (A) (by C-G formula based on SCr of 2.19 mg/dL (H)).  Liver Function Tests: Recent Labs  Lab 07/07/19 1201 07/08/19 0500  AST 26 23  ALT 15 13  ALKPHOS 68 57  BILITOT 0.8 0.9  PROT 7.4 6.2*  ALBUMIN 3.1* 2.5*    Coagulation Profile: Recent Labs  Lab 07/07/19 1554 07/08/19 0500  INR 1.4* 1.4*    CBG: Recent Labs  Lab 07/09/19 0639 07/09/19 1124 07/09/19 1552 07/09/19 2342 07/10/19 0619  GLUCAP 95 74 80 86 104*     Recent Results (from the past 240 hour(s))  SARS Coronavirus 2 by RT PCR (hospital order, performed in [facility] hospital lab) Nasopharyngeal Nasopharyngeal Swab     Status: None   Collection Time: 07/07/19  4:09 PM   Specimen: Nasopharyngeal Swab  Result Value Ref Range Status   SARS Coronavirus 2 NEGATIVE NEGATIVE Final    Comment: (NOTE) SARS-CoV-2 target nucleic acids are NOT DETECTED.  The SARS-CoV-2 RNA is generally detectable in upper and lower respiratory specimens during the acute phase of infection. The lowest concentration of SARS-CoV-2 viral copies this assay can detect is 250 copies / mL. A negative result does not preclude SARS-CoV-2  infection and should not be used as the sole basis for treatment or other patient management decisions.  A negative result may occur with improper specimen collection / handling, submission of specimen other than nasopharyngeal swab, presence of viral mutation(s) within the areas targeted by this assay, and  inadequate number of viral copies (<250 copies / mL). A negative result must be combined with clinical observations, patient history, and epidemiological information.  Fact Sheet for Patients:   StrictlyIdeas.no  Fact Sheet for Healthcare Providers: BankingDealers.co.za  This test is not yet approved or  cleared by the [place] FDA and has been authorized for detection and/or diagnosis of SARS-CoV-2 by FDA under an Emergency Use Authorization (EUA).  This EUA will remain in effect (meaning this test can be used) for the duration of the COVID-19 declaration under Section 564(b)(1) of the Act, 21 U.S.C. section 360bbb-3(b)(1), unless the authorization is terminated or revoked sooner.  Performed at [facility], [address] [place], [place] [address]       Radiology Studies: MR BRAIN WO CONTRAST  Result Date: 07/08/2019 CLINICAL DATA:  Neurologic deficit.  Possible acute infarct. EXAM: MRI HEAD WITHOUT CONTRAST TECHNIQUE: Multiplanar, multiecho pulse sequences of the brain and surrounding structures were obtained without intravenous contrast. COMPARISON:  03/21/2019 FINDINGS: Brain: There are numerous scattered small areas of acute ischemia, greatest in both cerebellar hemispheres. There are additional foci within all of the cerebral lobes, greatest in the frontal lobes. There is no midline shift or other mass effect. No acute hemorrhage. Early confluent hyperintense T2-weighted signal of the periventricular and deep white matter, most commonly due to chronic ischemic microangiopathy. Normal midline structures. There are  numerous chronic microhemorrhages in a predominantly central distribution. Vascular: Normal flow voids. Skull and upper cervical spine: Normal marrow signal. Sinuses/Orbits: Negative. Other: None. IMPRESSION: 1. Numerous scattered small areas of acute ischemia scattered throughout the brain, likely indicating a central embolic source. 2. Numerous chronic microhemorrhages in a predominantly central distribution, consistent with chronic hypertensive angiopathy. 3. Findings of chronic ischemic microangiopathy. Electronically Signed   By: Ulyses Jarred M.D.   On: 07/08/2019 21:05   ECHOCARDIOGRAM COMPLETE  Result Date: 07/09/2019    ECHOCARDIOGRAM REPORT   Patient Name:   LORIS SEELYE Date of Exam: 07/09/2019 Medical Rec #:  [id number]         Height:       64.0 in Accession #:    [id number]        Weight:       125.0 lb Date of Birth:  12-31-1931        BSA:          1.602 m Patient Age:    48 years          BP:           131/60 mmHg Patient Gender: F                 HR:           91 bpm. Exam Location:  [facility] Procedure: 2D Echo, Color Doppler and Cardiac Doppler Indications:     I163.9 Stroke  History:         Patient has prior history of Echocardiogram examinations, most                  recent 03/23/2019. Risk Factors:Hypertension.  Sonographer:     Charmayne Sheer RDCS (AE) Referring Phys:  [id number] Bonnielee Haff Diagnosing Phys: Serafina Royals MD  Sonographer Comments: No parasternal window. IMPRESSIONS  1. Left ventricular ejection fraction, by estimation, is 55 to 60%. The left ventricle has normal function. The left ventricle has no regional wall motion abnormalities. Left ventricular diastolic parameters were normal.  2. Right ventricular systolic function is normal. The right  ventricular size is normal. There is normal pulmonary artery systolic pressure.  3. Left atrial size was severely dilated.  4. The mitral valve is degenerative. Mild to moderate mitral valve regurgitation.  5. The aortic valve is normal in  structure. Aortic valve regurgitation is not visualized. FINDINGS  Left Ventricle: Left ventricular ejection fraction, by estimation, is 55 to 60%. The left ventricle has normal function. The left ventricle has no regional wall motion abnormalities. The left ventricular internal cavity size was small. There is no left ventricular hypertrophy. Left ventricular diastolic parameters were normal. Right Ventricle: The right ventricular size is normal. No increase in right ventricular wall thickness. Right ventricular systolic function is normal. There is normal pulmonary artery systolic pressure. The tricuspid regurgitant velocity is 1.66 m/s, and  with an assumed right atrial pressure of 10 mmHg, the estimated right ventricular systolic pressure is 14.9 mmHg. Left Atrium: Left atrial size was severely dilated. Right Atrium: Right atrial size was normal in size. Pericardium: There is no evidence of pericardial effusion. Mitral Valve: The mitral valve is degenerative in appearance. Mild to moderate mitral valve regurgitation. MV peak gradient, 9.5 mmHg. The mean mitral valve gradient is 4.0 mmHg. Tricuspid Valve: The tricuspid valve is normal in structure. Tricuspid valve regurgitation is mild. Aortic Valve: The aortic valve is normal in structure. Aortic valve regurgitation is not visualized. Aortic valve mean gradient measures 4.0 mmHg. Aortic valve peak gradient measures 8.2 mmHg. Pulmonic Valve: The pulmonic valve was normal in structure. Pulmonic valve regurgitation is not visualized. Aorta: The aortic root and ascending aorta are structurally normal, with no evidence of dilitation. IAS/Shunts: No atrial level shunt detected by color flow Doppler.   LV Volumes (MOD) LV vol d, MOD A2C: 57.3 ml Diastology LV vol d, MOD A4C: 37.5 ml LV e' lateral:   6.42 cm/s LV vol s, MOD A2C: 34.0 ml LV E/e' lateral: 25.5 LV vol s, MOD A4C: 23.2 ml LV e' medial:    5.87 cm/s LV SV MOD A2C:     23.3 ml LV E/e' medial:  27.9 LV SV MOD  A4C:     37.5 ml LV SV MOD BP:      18.3 ml RIGHT VENTRICLE RV Basal diam:  2.04 cm LEFT ATRIUM             Index       RIGHT ATRIUM          Index LA Vol (A2C):   85.0 ml 53.05 ml/m RA Area:     9.99 cm LA Vol (A4C):   86.8 ml 54.18 ml/m RA Volume:   16.60 ml 10.36 ml/m LA Biplane Vol: 91.3 ml 56.99 ml/m  AORTIC VALVE                   PULMONIC VALVE AV Vmax:           143.00 cm/s PV Vmax:       1.70 m/s AV Vmean:          95.900 cm/s PV Vmean:      115.000 cm/s AV VTI:            0.242 m     PV VTI:        0.270 m AV Peak Grad:      8.2 mmHg    PV Peak grad:  11.6 mmHg AV Mean Grad:      4.0 mmHg    PV Mean grad:  6.0 mmHg LVOT Vmax:  125.00 cm/s LVOT Vmean:        72.000 cm/s LVOT VTI:          0.202 m LVOT/AV VTI ratio: 0.83 MITRAL VALVE                TRICUSPID VALVE MV Area (PHT): 3.18 cm     TR Peak grad:   11.0 mmHg MV Peak grad:  9.5 mmHg     TR Vmax:        166.00 cm/s MV Mean grad:  4.0 mmHg MV Vmax:       1.54 m/s     SHUNTS MV Vmean:      85.9 cm/s    Systemic VTI: 0.20 m MV Decel Time: 239 msec MV E velocity: 164.00 cm/s MV A velocity: 51.40 cm/s MV E/A ratio:  3.19 Serafina Royals MD Electronically signed by Serafina Royals MD Signature Date/Time: 07/09/2019/4:56:20 PM    Final    CT HEAD CODE STROKE WO CONTRAST`  Result Date: 07/08/2019 CLINICAL DATA:  Right-sided weakness EXAM: CT HEAD WITHOUT CONTRAST TECHNIQUE: Contiguous axial images were obtained from the base of the skull through the vertex without intravenous contrast. COMPARISON:  05/27/2018 FINDINGS: Brain: No acute intracranial hemorrhage. Possible new subcentimeter focus of hypoattenuation involving the dorsal cortex of the left precentral gyrus medial to the hand motor region. There are scattered small chronic infarcts, largest within the left parietal lobe. Several chronic cerebellar infarcts are also again noted. There may be a new small left cerebellar infarct. Additional confluent areas of hypoattenuation in the  supratentorial white matter are nonspecific but probably reflect stable chronic microvascular ischemic changes. Ventricles are stable in size. There is no extra-axial fluid collection. Vascular: No hyperdense vessel. Intracranial atherosclerotic calcification at the skull base. Skull: Unremarkable. Sinuses/Orbits: Aerated.  No acute orbital finding. Other: Mastoid air cells are clear. IMPRESSION: No acute intracranial hemorrhage. Question of small age-indeterminate cortical infarct of the left precentral gyrus and small age-indeterminate left cerebellar infarct. Chronic findings detailed above. These results were called by telephone at the time of interpretation on 07/08/2019 at 5:30 pm to provider STANLEY LUE , who verbally acknowledged these results. Electronically Signed   By: Macy Mis M.D.   On: 07/08/2019 17:30       LOS: 3 days   [facility] Pager on [email or web address]  07/10/2019, 10:18 AM

## 2019-07-10 NOTE — Progress Notes (Signed)
GI In-Patient Progress Note   Per chart review, patient has been placed on comfort care today after visit with palliative care. No overt signs of GI bleeding. GI will sign off now.  Octavia Bruckner, PA-C [facility] Gastroenterology (No Charge - patient not seen)

## 2019-07-10 NOTE — TOC Initial Note (Signed)
Transition of Care [facility]) - Initial/Assessment Note    Patient Details  Name: MONETTE OMARA MRN: [id number] Date of Birth: 11/27/1931  [facility] of [facility] CM/SW Contact:    Elease Hashimoto, LCSW Phone Number: 07/10/2019, 4:17 PM  Clinical Narrative: Met with pt and daughter at bedside she has decided upon [facility]-hospice home upon discharge from hospital. [place]-palliative completing most form and DNR. [place]-[facility] to follow currently no hospice home bed available. Will place on waiting list.                Expected Discharge Plan: [place] Barriers to Discharge: Hospice Bed not available   Patient Goals and CMS Choice   CMS Medicare.gov Compare Post Acute Care list provided to:: Patient Represenative (must comment) Armed forces logistics/support/administrative officer) Choice offered to / list presented to : [place] / Guardian  Expected Discharge Plan and Services Expected Discharge Plan: [place] In-house Referral: Clinical Social Work   Post Acute Care Choice: Hospice Living arrangements for the past 2 months: [place] (4 days home with daughter had been for 4 months in SNF)                                      Prior Living Arrangements/Services Living arrangements for the past 2 months: Single Family Home (4 days home with daughter had been for 4 months in SNF)   Patient language and need for interpreter reviewed:: No Do you feel safe going back to the place where you live?: Yes      Need for Family Participation in Patient Care: No (Comment) Care giver support system in place?: No (comment)   Criminal Activity/Legal Involvement Pertinent to Current Situation/Hospitalization: No - Comment as needed  Activities of Daily Living Home Assistive Devices/Equipment: Eyeglasses, Cane (specify quad or straight) ADL Screening (condition at time of admission) Patient's cognitive ability adequate to safely complete daily activities?: No Is the  patient deaf or have difficulty hearing?: Yes Does the patient have difficulty seeing, even when wearing glasses/contacts?: No Does the patient have difficulty concentrating, remembering, or making decisions?: No Patient able to express need for assistance with ADLs?: Yes Does the patient have difficulty dressing or bathing?: No Independently performs ADLs?: Yes (appropriate for developmental age) Does the patient have difficulty walking or climbing stairs?: Yes Weakness of Legs: Both Weakness of Arms/Hands: None  Permission Sought/Granted Permission sought to share information with : Facility Sport and exercise psychologist, Family Supports Permission granted to share information with : Yes, Verbal Permission Granted  Share Information with NAME: Lowella Bandy  Permission granted to share info w AGENCY: [facility]  Permission granted to share info w Relationship: daughter  Permission granted to share info w Contact Information: Kieth Brightly  Emotional Assessment Appearance:: Appears stated age Attitude/Demeanor/Rapport: Unable to Assess Affect (typically observed): Calm Orientation: : Oriented to Self   Psych Involvement: No (comment)  Admission diagnosis:  AKI (acute kidney injury) ([place]) [N17.9] Black tarry stools [K92.1] Patient Active Problem List   Diagnosis Date Noted  . Weight loss 07/07/2019  . AKI (acute kidney injury) ([place]) 07/07/2019  . Melena 07/07/2019  . Altered mental status   . CVA (cerebral vascular accident) ([place]) 03/21/2019  . AF (paroxysmal atrial fibrillation) ([place]) 03/21/2019  . Acute CVA (cerebrovascular accident) ([place]) 03/21/2019  . Anemia due to stage 4 chronic kidney disease ([place]) 03/21/2019  . Acute delirium 02/21/2017  . Alzheimer's dementia ([place]) 02/21/2017  .  Acute encephalopathy 02/18/2017  . Acute metabolic encephalopathy 36/06/7701  . UTI (urinary tract infection) 12/24/2016  . Hemorrhoid   . History of colon cancer 04/05/2015  . Chronic anemia 03/10/2015  .  Osteopenia 12/11/2014  . SDAT (senile dementia of Alzheimer's type) ([place]) 06/18/2014  . Acquired hypothyroidism 03/01/2014  . Essential hypertension 03/01/2014  . Mixed hyperlipidemia 03/01/2014   PCP:  Kirk Ruths, MD Pharmacy:   [id number] [place]Phone: [phone]: [phone] [place] [address] [place]Phone: [phone]: [phone]    Social Determinants of Health (SDOH) Interventions    Readmission Risk Interventions No flowsheet data found.

## 2019-07-10 NOTE — Consult Note (Signed)
ANTICOAGULATION CONSULT NOTE - Initial Consult  Pharmacy Consult for Apixaban (Eliquis) Indication: atrial fibrillation  No Known Allergies  Patient Measurements: Height: 5\' 4"  (162.6 cm) Weight: 56.7 kg (125 lb) IBW/kg (Calculated) : 54.7  Vital Signs: Temp: 98.8 F (37.1 C) (07/09 0752) BP: 145/64 (07/09 0752) Pulse Rate: 67 (07/09 0752)  Labs: Recent Labs    07/07/19 1554 07/07/19 2042 07/08/19 0500 07/08/19 0500 07/09/19 0546 07/10/19 0608  HGB  --    < > 9.8*   < > 9.7* 9.4*  HCT  --    < > 30.3*  --  28.1* 28.6*  PLT  --   --  226  --  214 214  APTT 32  --   --   --   --   --   LABPROT 16.5*  --  16.9*  --   --   --   INR 1.4*  --  1.4*  --   --   --   CREATININE  --   --  2.87*  --  2.51* 2.19*   < > = values in this interval not displayed.    Estimated Creatinine Clearance: 16.2 mL/min (A) (by C-G formula based on SCr of 2.19 mg/dL (H)).   Medical History: Past Medical History:  Diagnosis Date  . Cancer [facility])    colon cancer   . Hemorrhoid   . Hypertension   . Thyroid disease    Assessment: Pharmacy consulted apixaban dosing and monitoring for 86 yo female with PMH of A. Fib. Per patient PTA med list patient was taking apixaban 5mg  BID.  Plan:  Age >65, Weight <60kg, Scr> 1.5  Will start Apixaban 2.5mg  twice daily.   While inpatient will monitor CBCs at least every 3 days.   Pernell Dupre, PharmD, BCPS Clinical Pharmacist 07/10/2019 10:19 AM

## 2019-07-10 NOTE — Evaluation (Signed)
Clinical/Bedside Swallow Evaluation Patient Details  Name: Haley Brooks MRN: [id number] Date of Birth: 01/10/1931  Today's Date: 07/10/2019 Time: SLP Start Time (ACUTE ONLY): 3559 SLP Stop Time (ACUTE ONLY): 1315 SLP Time Calculation (min) (ACUTE ONLY): 23 min  Past Medical History:  Past Medical History:  Diagnosis Date  . Cancer [facility])    colon cancer   . Hemorrhoid   . Hypertension   . Thyroid disease    Past Surgical History:  Past Surgical History:  Procedure Laterality Date  . COLON SURGERY    . FRACTURE SURGERY     HPI:  Haley Brooks is an 86 y.o. female with PMH of colon cancer, Alzheimer's dementia, HTN, depression, and prior stroke (February 2021) with subsequent STR stay. Pt had discharged home from STR earlier in the week. She presents to [facility] ED with c/o of black stools. MD assessment includes: AKI, melanotic stool, acute blood loss anemia, anorexia, hypoglycemia, afib, and chronic compression fracture deformities of the T11, L4 and L5 vertebral bodies. During admission, pt noted with acute R sided weakness. Code stroke was called. MRI revealed: numerous scattered small areas of acute ischemia likely indicating a central embolic source.   Assessment / Plan / Recommendation Clinical Impression  Pt presents with overall functional oropharyngeal abilities when consuming regular textures with thin liquids via straw. She does have a mildly long oral phase with advanced solids than minced solids and she enjoys talking as she reports being lonely. Given that she requires assistance with self-feeding, recommend dysphagia 2 diet with thin liquids, medicine whole with thins or puree. No further ST intervention is required at this time.  SLP Visit Diagnosis: Dysphagia, unspecified (R13.10)    Aspiration Risk  Mild aspiration risk    Diet Recommendation     Medication Administration: Whole meds with liquid    Other  Recommendations Oral Care Recommendations: Oral  care BID   Follow up Recommendations None        Swallow Study   General Date of Onset: 07/08/19 HPI: Haley Brooks is an 86 y.o. female with PMH of colon cancer, Alzheimer's dementia, HTN, depression, and prior stroke (February 2021) with subsequent STR stay. Pt had discharged home from STR earlier in the week. She presents to [facility] ED with c/o of black stools. MD assessment includes: AKI, melanotic stool, acute blood loss anemia, anorexia, hypoglycemia, afib, and chronic compression fracture deformities of the T11, L4 and L5 vertebral bodies. During admission, pt noted with acute R sided weakness. Code stroke was called. MRI revealed: numerous scattered small areas of acute ischemia likely indicating a central embolic source. Type of Study: Bedside Swallow Evaluation Previous Swallow Assessment: previous BSE, no oropharyngeal deficits Diet Prior to this Study: NPO Temperature Spikes Noted: No Respiratory Status: Room air History of Recent Intubation: No Behavior/Cognition: Alert Oral Cavity Assessment: Within Functional Limits Oral Care Completed by SLP: No Oral Cavity - Dentition: Adequate natural dentition Self-Feeding Abilities: Total assist Patient Positioning: Upright in bed Baseline Vocal Quality: Normal Volitional Cough: Cognitively unable to elicit Volitional Swallow: Unable to elicit    Oral/Motor/Sensory Function Overall Oral Motor/Sensory Function: Generalized oral weakness   Ice Chips Ice chips: Not tested   Thin Liquid Thin Liquid: Within functional limits Presentation: Straw    Nectar Thick Nectar Thick Liquid: Not tested   Honey Thick Honey Thick Liquid: Not tested   Puree Puree: Within functional limits Presentation: Spoon   Solid    Jenisse Vullo B. Rutherford Nail, M.S., CCC-SLP, [place] Speech-Language Pathologist  Rehabilitation Services Office [phone]  Solid: Within functional limits      Siren Porrata 07/10/2019,2:41 PM

## 2019-07-10 NOTE — Progress Notes (Addendum)
   07/10/19 1408  Assess: MEWS Score  Temp 97.6 F (36.4 C)  BP (!) 145/96  Pulse Rate (!) 128  ECG Heart Rate (!) 128  Resp 16  Level of Consciousness Alert  SpO2 100 %  O2 Device Room Air  Assess: MEWS Score  MEWS Temp 0  MEWS Systolic 0  MEWS Pulse 2  MEWS RR 0  MEWS LOC 0  MEWS Score 2  MEWS Score Color Yellow  Assess: if the MEWS score is Yellow or Red  Were vital signs taken at a resting state? Yes  Focused Assessment Documented focused assessment  Early Detection of Sepsis Score *See Row Information* Low  MEWS guidelines implemented *See Row Information* Yes  Treat  MEWS Interventions Other (Comment) (notified MD)  Take Vital Signs  Increase Vital Sign Frequency  Yellow: Q 2hr X 2 then Q 4hr X 2, if remains yellow, continue Q 4hrs  Escalate  MEWS: Escalate Yellow: discuss with charge nurse/RN and consider discussing with provider and RRT  Notify: Charge Nurse/RN  Name of Charge Nurse/RN Notified Chrisitna   Date Charge Nurse/RN Notified 07/10/19  Time Charge Nurse/RN Notified [id number]  Notify: Provider  Provider Name/Title [place] Pink  Date Provider Notified 07/10/19  Time Provider Notified 1412  Notification Type Page  Notification Reason Change in status (increase HR )  Response See new orders  Date of Provider Response 07/10/19  Time of Provider Response 1419  Document  Patient Outcome Other (Comment) (conitine to monitor )  initiated yellow mews protocol   1700 pt made comfort care, will end yellow mews protocol at this time

## 2019-07-11 DIAGNOSIS — Z515 Encounter for palliative care: Secondary | ICD-10-CM

## 2019-07-11 MED ORDER — LORAZEPAM 1 MG PO TABS: 1.0000 mg | ORAL_TABLET | ORAL | 0 refills | Status: AC | PRN

## 2019-07-11 MED ORDER — POLYVINYL ALCOHOL 1.4 % OP SOLN: 1.0000 [drp] | Freq: Four times a day (QID) | OPHTHALMIC | 0 refills | Status: AC | PRN

## 2019-07-11 MED ORDER — LORAZEPAM 2 MG/ML PO CONC: 1.0000 mg | ORAL | 0 refills | Status: AC | PRN

## 2019-07-11 MED ORDER — HYDROMORPHONE HCL 1 MG/ML IJ SOLN: 0.2500 mg | INTRAMUSCULAR | 0 refills | Status: AC | PRN

## 2019-07-11 MED ORDER — GLYCOPYRROLATE 1 MG PO TABS: 1.0000 mg | ORAL_TABLET | ORAL | Status: AC | PRN

## 2019-07-11 MED ORDER — HALOPERIDOL LACTATE 2 MG/ML PO CONC: 0.5000 mg | ORAL | 0 refills | Status: AC | PRN

## 2019-07-11 MED ORDER — GLYCOPYRROLATE 0.2 MG/ML IJ SOLN: 0.2000 mg | INTRAMUSCULAR | Status: AC | PRN

## 2019-07-11 MED ORDER — METOPROLOL TARTRATE 50 MG PO TABS: 50.0000 mg | ORAL_TABLET | Freq: Two times a day (BID) | ORAL | Status: AC

## 2019-07-11 MED ORDER — LORAZEPAM 2 MG/ML IJ SOLN: 0.5000 mg | INTRAMUSCULAR | 0 refills | Status: AC | PRN

## 2019-07-11 MED ORDER — HALOPERIDOL LACTATE 5 MG/ML IJ SOLN: 0.5000 mg | INTRAMUSCULAR | Status: AC | PRN

## 2019-07-11 MED ORDER — HALOPERIDOL 0.5 MG PO TABS: 0.5000 mg | ORAL_TABLET | ORAL | Status: AC | PRN

## 2019-07-11 NOTE — Plan of Care (Signed)
  Problem: Education: Goal: Knowledge of General Education information will improve Description: Including pain rating scale, medication(s)/side effects and non-pharmacologic comfort measures Outcome: Progressing   Problem: Health Behavior/Discharge Planning: Goal: Ability to manage health-related needs will improve Outcome: Progressing   Problem: Clinical Measurements: Goal: Ability to maintain clinical measurements within normal limits will improve Outcome: Progressing Goal: Will remain free from infection Outcome: Progressing Goal: Diagnostic test results will improve Outcome: Progressing Goal: Respiratory complications will improve Outcome: Progressing Goal: Cardiovascular complication will be avoided Outcome: Progressing   Problem: Activity: Goal: Risk for activity intolerance will decrease Outcome: Progressing   Problem: Nutrition: Goal: Adequate nutrition will be maintained Outcome: Progressing   Problem: Coping: Goal: Level of anxiety will decrease Outcome: Progressing   Problem: Elimination: Goal: Will not experience complications related to bowel motility Outcome: Progressing Goal: Will not experience complications related to urinary retention Outcome: Progressing   Problem: Pain Managment: Goal: General experience of comfort will improve Outcome: Progressing   Problem: Safety: Goal: Ability to remain free from injury will improve Outcome: Progressing   Problem: Skin Integrity: Goal: Risk for impaired skin integrity will decrease Outcome: Progressing   Problem: Education: Goal: Knowledge of disease or condition will improve Outcome: Progressing Goal: Knowledge of secondary prevention will improve Outcome: Progressing Goal: Knowledge of patient specific risk factors addressed and post discharge goals established will improve Outcome: Progressing   Problem: Self-Care: Goal: Ability to participate in self-care as condition permits will  improve Outcome: Progressing   Problem: Ischemic Stroke/TIA Tissue Perfusion: Goal: Complications of ischemic stroke/TIA will be minimized Outcome: Progressing

## 2019-07-11 NOTE — Progress Notes (Signed)
[facility] PROGRESS NOTE   Haley Brooks KPT:[id number] DOB: [birth year] DOA: 07/07/2019  PCP: Kirk Ruths, MD  Brief History/Interval Summary: 86 y.o. female with a past medical history of colon cancer, Alzheimer's dementia, essential hypertension, history of depression, history of stroke in February of this year, who was living in a skilled nursing facility till past week and came back home this past Thursday.  She has been cared for at home by her daughter.  Most of the history was provided by the daughter.  Patient is a very poor historian due to her dementia.  According to the daughter patient was complaining of some nausea on Thursday and Friday.  For the nausea the daughter gave her medications similar to Pepto-Bismol although she is not certain.  It was recommended by a pharmacist at [facility]  Subsequently daughter noticed the patient was not having any bowel movements.  So she gave her a laxative which was followed by onset of multiple loose stools.  The stools were black in color.  Patient did not have any vomiting.  No fever or chills.  Patient denies any pain currently. Apparently she was experiencing extremely poor oral intake for past several weeks.  Apparently this has been the case even at the skilled nursing facility.  She was started on appetite stimulant while she was there.    In the emergency department stool was tested and was positive for occult blood.  It was black in color as per the ED provider.  Hemoglobin was noted to be stable however.  She was found to have acute kidney injury.  Patient's daughter also mentioned that patient had lost about 22 pounds in the last 3 to 4 months.  Patient denies any difficulty swallowing.  According to the daughter she is able to swallow her tablets and other medications without difficulties.  She just does not want to eat any food.    Consultants:  Gastroenterology.   Neurology Palliative medicine  Procedures:  None yet  Antibiotics: Anti-infectives (From admission, onward)   None      Subjective/Interval History: Patient remains confused and distracted.  Denies any complaints.  Seems to be comfortable.    Assessment/Plan:  Acute embolic stroke Patient noted to have right arm weakness on 7/7.  Neurological work-up was done.  MRI shows acute embolic stroke.  This is her second stroke in the last 3 months.  Neurology was consulted.  Echocardiogram has been done as well as below.  Normal systolic function noted.  No obvious clots are appreciated.  Carotid Doppler studies done a few months ago did not show any significant stenosis. Patient had been off of her Eliquis since she was discharged from her skilled nursing facility last week.  This is because daughter did not realize that she was supposed to be taking this medication.  Plus she presented to the hospital with concerns for GI bleed. Patient seen by gastroenterology as discussed below.  They do not plan any endoscopy at this time due to stable hemoglobins.  They are okay with resuming anticoagulation.  This was discussed with patient's daughter who is agreeable with this plan.  Eliquis was resumed.  Patient was seen by palliative medicine yesterday.  Subsequently after discussions with patient's daughter patient has been transitioned to comfort care.  Acute kidney injury with metabolic acidosis This was most likely due to poor oral intake.  Her baseline creatinine seems to be around 1.3.  She presented with a creatinine of 2.93 and a  BUN of 72.   She was started on IV fluids.  Renal function has been slowly improving.  BUN was down to 58 and creatinine 2.19.  . CT scan shows bilateral renal cysts but no evidence of obstruction.  Atrophic right kidney was noted.  Compensatory increase in size of the left kidney is noted.  Now she is comfort care.  Questionable melanotic stool Daughter mentioned black-colored stool.  Unclear as to the etiology  for the same.  It looks like she is very constipated based on CT scan.  She was given laxatives and products similar to Pepto-Bismol.  CT scan also shows significantly thickened stomach wall which could be due to gastritis.  Patient started on PPI which is being continued. Gastroenterology has been following.  At this time they do not have any plans to do upper endoscopy especially considering her acute stroke.  They cleared the patient to resume anticoagulation.  Her hemoglobin has been stable the last several days.  No overt bleeding has been noted here in the hospital.  Acute blood loss anemia Hemoglobin did drop initially which could be dilutional.  No overt bleeding has been noted.  Hemoglobin has been stable.  History of anorexia and weight loss/thickened gastric wall CT scan findings as noted above.  Now patient has been transitioned to comfort care.  Hypoglycemia Likely due to n.p.o. status.  IV fluids had to be changed to D5.  Due to significant hypoglycemia we will continue the fluids despite her acute stroke.    History of paroxysmal atrial fibrillation with RVR yesterday Heart rate was noted to be elevated yesterday.  Noted to be in rapid A. fib.  Improved after she was placed back on her metoprolol.  Daughter was not giving the patient Eliquis after patient was released from skilled nursing facility last week as she did not realize that she was supposed to continue with the medication.  History of colon cancer in 2005 She is status post colectomy and chemotherapy previously.  History of Alzheimer's dementia Seems to be close to her baseline.  Patient was on Aricept and Namenda at home.  Now discontinued.  Other findings on CT abdomen Fibroid uterus. Chronic compression fracture deformities of the T11, L4 and L5 vertebral bodies.  Bilateral renal cysts. This can be followed in the outpatient setting.  Goals of care Full CODE STATUS to continue after long discussion with  daughter at the time of admission.  Now patient has sustained an acute stroke with right arm weakness.  She has nonspecific findings on CT scan which may need further evaluation.  There is concern for malignancy.   Patient has had very poor oral intake with significant weight loss over the last few months.  All this portends poor prognosis.  This was all discussed with the patient's daughter.  Palliative medicine consultation was requested.  They have discussed with family.  Patient has been transitioned to comfort care.  Waiting on bed at residential hospice.   DVT Prophylaxis: SCDs Code Status: Full code Family Communication: Daughter being updated daily. Disposition Plan:  Status is: Inpatient  Remains inpatient appropriate because:Altered mental status and Inpatient level of care appropriate due to severity of illness   Dispo:  Patient From: Home  Planned Disposition: To be determined  Expected discharge date: 07/13/19  Medically stable for discharge: No       Medications:  Scheduled: . mouth rinse  15 mL Mouth Rinse BID  . metoprolol tartrate  50 mg Oral BID  .  pantoprazole (PROTONIX) IV  40 mg Intravenous Q12H  . sodium chloride flush  3 mL Intravenous Q12H   Continuous:  BOF:BPZWCHENIDPOE **OR** acetaminophen, antiseptic oral rinse, glycopyrrolate **OR** glycopyrrolate **OR** glycopyrrolate, haloperidol **OR** haloperidol **OR** haloperidol lactate, HYDROmorphone (DILAUDID) injection, LORazepam **OR** LORazepam **OR** LORazepam, ondansetron **OR** ondansetron (ZOFRAN) IV, polyvinyl alcohol, sodium chloride flush   Objective:  Vital Signs  Vitals:   07/10/19 1507 07/10/19 1833 07/10/19 2040 07/11/19 0720  BP: (!) 140/94 (!) 160/86 (!) 144/67 (!) 162/65  Pulse: 66 76 66 66  Resp:  20 15 17   Temp: 97.8 F (36.6 C) 97.9 F (36.6 C) (!) 97.5 F (36.4 C) 98.1 F (36.7 C)  TempSrc: Axillary Oral Oral   SpO2: 100% 98% 99% 99%  Weight:      Height:         Intake/Output Summary (Last 24 hours) at 07/11/2019 1100 Last data filed at 07/10/2019 1908 Gross per 24 hour  Intake 2218.39 ml  Output --  Net 2218.39 ml   Filed Weights   07/07/19 1150  Weight: 56.7 kg    General appearance: Awake alert.  In no distress.  Distracted Resp: Clear to auscultation bilaterally.  Normal effort Cardio: S1-S2 is normal regular.  No S3-S4.  No rubs murmurs or bruit GI: Abdomen is soft.  Nontender nondistended.  Bowel sounds are present normal.  No masses organomegaly Extremities: No edema.  Full range of motion of lower extremities. Neurologic: Right arm weakness is noted.  Only slight movement of the fingers is appreciated.    Lab Results:  Data Reviewed: I have personally reviewed following labs and imaging studies  CBC: Recent Labs  Lab 07/07/19 1201 07/07/19 2042 07/08/19 0500 07/09/19 0546 07/10/19 0608  WBC 12.1*  --  15.6* 16.0* 13.0*  HGB 11.8* 10.4* 9.8* 9.7* 9.4*  HCT 36.6 32.2* 30.3* 28.1* 28.6*  MCV 89.9  --  89.6 86.2 88.8  PLT 316  --  226 214 423    Basic Metabolic Panel: Recent Labs  Lab 07/07/19 1201 07/08/19 0500 07/09/19 0546 07/10/19 0608  NA 138 135 135 137  K 4.3 4.4 4.1 3.8  CL 105 106 108 111  CO2 17* 18* 18* 20*  GLUCOSE 97 58* 105* 120*  BUN 72* 74* 65* 58*  CREATININE 2.93* 2.87* 2.51* 2.19*  CALCIUM 9.2 8.5* 8.3* 8.5*    GFR: Estimated Creatinine Clearance: 16.2 mL/min (A) (by C-G formula based on SCr of 2.19 mg/dL (H)).  Liver Function Tests: Recent Labs  Lab 07/07/19 1201 07/08/19 0500  AST 26 23  ALT 15 13  ALKPHOS 68 57  BILITOT 0.8 0.9  PROT 7.4 6.2*  ALBUMIN 3.1* 2.5*    Coagulation Profile: Recent Labs  Lab 07/07/19 1554 07/08/19 0500  INR 1.4* 1.4*    CBG: Recent Labs  Lab 07/09/19 1124 07/09/19 1552 07/09/19 2342 07/10/19 0619 07/10/19 1441  GLUCAP 74 80 86 104* 133*     Recent Results (from the past 240 hour(s))  SARS Coronavirus 2 by RT PCR (hospital  order, performed in [facility] hospital lab) Nasopharyngeal Nasopharyngeal Swab     Status: None   Collection Time: 07/07/19  4:09 PM   Specimen: Nasopharyngeal Swab  Result Value Ref Range Status   SARS Coronavirus 2 NEGATIVE NEGATIVE Final    Comment: (NOTE) SARS-CoV-2 target nucleic acids are NOT DETECTED.  The SARS-CoV-2 RNA is generally detectable in upper and lower respiratory specimens during the acute phase of infection. The lowest concentration of SARS-CoV-2  viral copies this assay can detect is 250 copies / mL. A negative result does not preclude SARS-CoV-2 infection and should not be used as the sole basis for treatment or other patient management decisions.  A negative result may occur with improper specimen collection / handling, submission of specimen other than nasopharyngeal swab, presence of viral mutation(s) within the areas targeted by this assay, and inadequate number of viral copies (<250 copies / mL). A negative result must be combined with clinical observations, patient history, and epidemiological information.  Fact Sheet for Patients:   StrictlyIdeas.no  Fact Sheet for Healthcare Providers: BankingDealers.co.za  This test is not yet approved or  cleared by the [place] FDA and has been authorized for detection and/or diagnosis of SARS-CoV-2 by FDA under an Emergency Use Authorization (EUA).  This EUA will remain in effect (meaning this test can be used) for the duration of the COVID-19 declaration under Section 564(b)(1) of the Act, 21 U.S.C. section 360bbb-3(b)(1), unless the authorization is terminated or revoked sooner.  Performed at [facility], [address], [place], [place] [address]       Radiology Studies: No results found.     LOS: 4 days   Salvador Coupe [employer] on [email or web address]  07/11/2019, 11:00 AM

## 2019-07-11 NOTE — Discharge Summary (Signed)
[facility]  Physician Discharge Summary   Patient ID: Haley Brooks MRN: [id number] DOB/AGE: [id number] y.o.  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 Patient waiting on placement to residential hospice    CODE STATUS: DNR  DISCHARGE CONDITION: fair  Diet recommendation: Comfort feeds as tolerated  INITIAL HISTORY: 86 y.o.femalewith a past medical history of colon cancer, Alzheimer's dementia, essential hypertension, history of depression, history of stroke in February of this year, who was living in a skilled nursing facility till past weekand came back homethis past Thursday. She has been cared for at home by her daughter. Most of the history was provided by the daughter. Patient is a very poor historian due to her dementia. According to the daughter patient was complaining of some nausea on Thursday and Friday. For the nausea the daughter gave her medications similar to Pepto-Bismol although she is not certain. It was recommended by apharmacist at [facility] Subsequently daughter noticed the patient was not having any bowel movements. So she gave her a laxative which was followed by onset of multiple loose stools. The stools were black in color. Patient did not have any vomiting. No fever or chills. Patient denies any pain currently. Apparently she was experiencingextremely poor oral intake for past several weeks. Apparently this has been the case even at the skilled nursing facility. She was started on appetite  stimulant while she was there.   In the emergency department stool was tested and was positive for occult blood. It was black in coloras per the ED provider. Hemoglobin was noted to be stable however. She was found to have acute kidney injury. Patient's daughter also mentioned that patient had lost about 22 pounds in the last 3 to 4 months. Patient denies any difficulty swallowing. According to the daughter she is able to swallow her tablets and other medications without difficulties. She just does not want to eat any food.   Consultants:  Gastroenterology.   Neurology Palliative medicine  Procedures: None    HOSPITAL COURSE:   Acute embolic stroke Patient noted to have right arm weakness on 7/7.  Neurological work-up was done.  MRI shows acute embolic stroke.  This is her second stroke in the last 3 months.  Neurology was consulted.  Echocardiogram has been done as well as below.  Normal systolic function noted.  No obvious clots are appreciated.  Carotid Doppler studies done a few months ago did not show any significant stenosis. Patient had been off of her Eliquis since she was discharged from her skilled nursing facility last week.  This is because daughter did not realize that she was supposed to be taking this medication.  Plus she presented to the hospital with concerns for GI bleed. Patient seen by gastroenterology as discussed below.  They do not plan any endoscopy at this time due to stable hemoglobins.  They are okay with resuming anticoagulation.  This was discussed with patient's daughter who is agreeable with this plan.  Eliquis was resumed.  Patient was seen by palliative medicine subsequently.  After discussions with patient's daughter patient has been transitioned to comfort care.  Acute kidney injury with  metabolic acidosis This was most likely due to poor oral intake.  Her baseline creatinine seems to be around 1.3.  She presented with a creatinine of 2.93 and a  BUN of 72.   She was started on IV fluids.  Renal function has been slowly improving.  BUN was down to 58 and creatinine 2.19. CT scan shows bilateral renal cysts but no evidence of obstruction.  Atrophic right kidney was noted.  Compensatory increase in size of the left kidney is noted.  Now she is comfort care.  Questionable melanotic stool Daughter mentioned black-colored stool.  Unclear as to the etiology for the same.  It looks like she is very constipated based on CT scan.  She was given laxatives and products similar to Pepto-Bismol.  CT scan also shows significantly thickened stomach wall which could be due to gastritis.  Patient started on PPI which is being continued. Gastroenterology has been following.  At this time they do not have any plans to do upper endoscopy especially considering her acute stroke.  They cleared the patient to resume anticoagulation.  Her hemoglobin has been stable the last several days.  No overt bleeding has been noted here in the hospital.  Acute blood loss anemia Hemoglobin did drop initially which could be dilutional.  No overt bleeding has been noted.  Hemoglobin has been stable.  History of anorexia and weight loss/thickened gastric wall CT scan findings as noted above.  Now patient has been transitioned to comfort care.  Hypoglycemia Likely due to n.p.o. status.  IV fluids had to be changed to D5.  Due to significant hypoglycemia we will continue the fluids despite her acute stroke.    History of paroxysmal atrial fibrillation with RVR yesterday Heart rate was noted to be elevated yesterday.  Noted to be in rapid A. fib.  Improved after she was placed back on her metoprolol.  Daughter was not giving the patient Eliquis after patient was released from skilled nursing facility last week as she did not realize that she was supposed to continue with the medication.  History of colon cancer in 2005 She is status post colectomy and chemotherapy  previously.  History of Alzheimer's dementia Seems to be close to her baseline.  Patient was on Aricept and Namenda at home.  Now discontinued.  Other findings on CT abdomen Fibroid uterus. Chronic compression fracture deformities of the T11, L4 and L5 vertebral bodies.  Bilateral renal cysts. This can be followed in the outpatient setting.  Goals of care Full CODE STATUS to continue after long discussion with daughter at the time of admission.  Now patient has sustained an acute stroke with right arm weakness.  She has nonspecific findings on CT scan which may need further evaluation.  There is concern for malignancy.   Patient has had very poor oral intake with significant weight loss over the last few months.  All this portends poor prognosis.  This was all discussed with the patient's daughter.  Palliative medicine consultation was requested.  They have discussed with family.  Patient has been transitioned to comfort care.  Waiting on bed at residential hospice.      Pressure Injury 07/10/19 Sacrum Left;Upper Stage 2 -  Partial thickness loss of dermis presenting as a shallow open injury with a red, pink wound bed without slough. (Active)  07/10/19 1544  Location: Sacrum  Location Orientation: Left;Upper  Staging: Stage 2 -  Partial thickness loss of dermis presenting as a shallow open injury with a  red, pink wound bed without slough.  Wound Description (Comments):   Present on Admission: Yes     Waiting on placement to residential hospice.  PERTINENT LABS:  The results of significant diagnostics from this hospitalization (including imaging, microbiology, ancillary and laboratory) are listed below for reference.    Microbiology: Recent Results (from the past 240 hour(s))  SARS Coronavirus 2 by RT PCR (hospital order, performed in [facility] hospital lab) Nasopharyngeal Nasopharyngeal Swab     Status: None   Collection Time: 07/07/19  4:09 PM   Specimen: Nasopharyngeal  Swab  Result Value Ref Range Status   SARS Coronavirus 2 NEGATIVE NEGATIVE Final    Comment: (NOTE) SARS-CoV-2 target nucleic acids are NOT DETECTED.  The SARS-CoV-2 RNA is generally detectable in upper and lower respiratory specimens during the acute phase of infection. The lowest concentration of SARS-CoV-2 viral copies this assay can detect is 250 copies / mL. A negative result does not preclude SARS-CoV-2 infection and should not be used as the sole basis for treatment or other patient management decisions.  A negative result may occur with improper specimen collection / handling, submission of specimen other than nasopharyngeal swab, presence of viral mutation(s) within the areas targeted by this assay, and inadequate number of viral copies (<250 copies / mL). A negative result must be combined with clinical observations, patient history, and epidemiological information.  Fact Sheet for Patients:   StrictlyIdeas.no  Fact Sheet for Healthcare Providers: BankingDealers.co.za  This test is not yet approved or  cleared by the [place] FDA and has been authorized for detection and/or diagnosis of SARS-CoV-2 by FDA under an Emergency Use Authorization (EUA).  This EUA will remain in effect (meaning this test can be used) for the duration of the COVID-19 declaration under Section 564(b)(1) of the Act, 21 U.S.C. section 360bbb-3(b)(1), unless the authorization is terminated or revoked sooner.  Performed at [facility], [address] [place], [place] [address]      Labs:    Basic Metabolic Panel: Recent Labs  Lab 07/07/19 1201 07/08/19 0500 07/09/19 0546 07/10/19 0608  NA 138 135 135 137  K 4.3 4.4 4.1 3.8  CL 105 106 108 111  CO2 17* 18* 18* 20*  GLUCOSE 97 58* 105* 120*  BUN 72* 74* 65* 58*  CREATININE 2.93* 2.87* 2.51* 2.19*  CALCIUM 9.2 8.5* 8.3* 8.5*   Liver Function Tests: Recent Labs  Lab  07/07/19 1201 07/08/19 0500  AST 26 23  ALT 15 13  ALKPHOS 68 57  BILITOT 0.8 0.9  PROT 7.4 6.2*  ALBUMIN 3.1* 2.5*   CBC: Recent Labs  Lab 07/07/19 1201 07/07/19 2042 07/08/19 0500 07/09/19 0546 07/10/19 0608  WBC 12.1*  --  15.6* 16.0* 13.0*  HGB 11.8* 10.4* 9.8* 9.7* 9.4*  HCT 36.6 32.2* 30.3* 28.1* 28.6*  MCV 89.9  --  89.6 86.2 88.8  PLT 316  --  226 214 214   CBG: Recent Labs  Lab 07/09/19 1124 07/09/19 1552 07/09/19 2342 07/10/19 0619 07/10/19 1441  GLUCAP 74 80 86 104* 133*     IMAGING STUDIES CT ABDOMEN PELVIS WO CONTRAST  Result Date: 07/07/2019 CLINICAL DATA:  Weight loss and poor appetite. EXAM: CT ABDOMEN AND PELVIS WITHOUT CONTRAST TECHNIQUE: Multidetector CT imaging of the abdomen and pelvis was performed following the standard protocol without IV contrast. COMPARISON:  March 21, 2019 FINDINGS: Lower chest: Mild atelectasis is seen within the bilateral lung bases. There is a small right pleural effusion.  Hepatobiliary: No focal liver abnormality is seen. Status post cholecystectomy. The common bile duct is dilated (0.9 cm). Pancreas: Unremarkable. No pancreatic ductal dilatation or surrounding inflammatory changes. Spleen: Normal in size without focal abnormality. Adrenals/Urinary Tract: Adrenal glands are unremarkable. The right kidney is atrophic in appearance. Mild compensatory hypertrophy of the left kidney is seen. A 2.7 cm cyst is seen within the mid right kidney. Multiple cysts are seen within the left kidney. The largest measures 4.0 cm in diameter and is located along the anterolateral aspect of the mid to upper left kidney. Bladder is unremarkable. Stomach/Bowel: There is moderate severity diffuse gastric wall thickening. The appendix is not clearly identified. No evidence of bowel dilatation. A large amount of stool is seen within the distal sigmoid colon. Noninflamed diverticula are seen throughout the large bowel. Vascular/Lymphatic: There is moderate  severity calcification of the abdominal aorta. No enlarged abdominal or pelvic lymph nodes. Reproductive: A 3.0 cm x 2.2 cm well-defined area of low attenuation is seen within the uterine fundus. Other: No abdominal wall hernia or abnormality. No abdominopelvic ascites. Musculoskeletal: Chronic compression fracture deformities are seen at the levels of T11, L4 and L5. IMPRESSION: 1. Moderate severity diffuse gastric wall thickening which may represent sequelae associated with an acute gastritis. Further evaluation is recommended as an underlying neoplastic process cannot be excluded. 2. Small right pleural effusion. 3. Status post cholecystectomy. 4. Bilateral renal cysts. 5. Findings likely consistent with a fibroid uterus. 6. Large amount of stool within the distal sigmoid colon. 7. Chronic compression fracture deformities of the T11, L4 and L5 vertebral bodies. 8. Aortic atherosclerosis. Aortic Atherosclerosis (ICD10-I70.0). Electronically Signed   By: Virgina Norfolk M.D.   On: 07/07/2019 20:14   MR BRAIN WO CONTRAST  Result Date: 07/08/2019 CLINICAL DATA:  Neurologic deficit.  Possible acute infarct. EXAM: MRI HEAD WITHOUT CONTRAST TECHNIQUE: Multiplanar, multiecho pulse sequences of the brain and surrounding structures were obtained without intravenous contrast. COMPARISON:  03/21/2019 FINDINGS: Brain: There are numerous scattered small areas of acute ischemia, greatest in both cerebellar hemispheres. There are additional foci within all of the cerebral lobes, greatest in the frontal lobes. There is no midline shift or other mass effect. No acute hemorrhage. Early confluent hyperintense T2-weighted signal of the periventricular and deep white matter, most commonly due to chronic ischemic microangiopathy. Normal midline structures. There are numerous chronic microhemorrhages in a predominantly central distribution. Vascular: Normal flow voids. Skull and upper cervical spine: Normal marrow signal.  Sinuses/Orbits: Negative. Other: None. IMPRESSION: 1. Numerous scattered small areas of acute ischemia scattered throughout the brain, likely indicating a central embolic source. 2. Numerous chronic microhemorrhages in a predominantly central distribution, consistent with chronic hypertensive angiopathy. 3. Findings of chronic ischemic microangiopathy. Electronically Signed   By: Ulyses Jarred M.D.   On: 07/08/2019 21:05   ECHOCARDIOGRAM COMPLETE  Result Date: 07/09/2019    ECHOCARDIOGRAM REPORT   Patient Name:   Haley Brooks Date of Exam: 07/09/2019 Medical Rec #:  [id number]         Height:       64.0 in Accession #:    [id number]        Weight:       125.0 lb Date of Birth:  09/30/1931        BSA:          1.602 m Patient Age:    10 years          BP:  131/60 mmHg Patient Gender: F                 HR:           91 bpm. Exam Location:  [facility] Procedure: 2D Echo, Color Doppler and Cardiac Doppler Indications:     I163.9 Stroke  History:         Patient has prior history of Echocardiogram examinations, most                  recent 03/23/2019. Risk Factors:Hypertension.  Sonographer:     Charmayne Sheer RDCS (AE) Referring Phys:  [id number] Bonnielee Haff Diagnosing Phys: Serafina Royals MD  Sonographer Comments: No parasternal window. IMPRESSIONS  1. Left ventricular ejection fraction, by estimation, is 55 to 60%. The left ventricle has normal function. The left ventricle has no regional wall motion abnormalities. Left ventricular diastolic parameters were normal.  2. Right ventricular systolic function is normal. The right ventricular size is normal. There is normal pulmonary artery systolic pressure.  3. Left atrial size was severely dilated.  4. The mitral valve is degenerative. Mild to moderate mitral valve regurgitation.  5. The aortic valve is normal in structure. Aortic valve regurgitation is not visualized. FINDINGS  Left Ventricle: Left ventricular ejection fraction, by estimation, is 55 to 60%. The left  ventricle has normal function. The left ventricle has no regional wall motion abnormalities. The left ventricular internal cavity size was small. There is no left ventricular hypertrophy. Left ventricular diastolic parameters were normal. Right Ventricle: The right ventricular size is normal. No increase in right ventricular wall thickness. Right ventricular systolic function is normal. There is normal pulmonary artery systolic pressure. The tricuspid regurgitant velocity is 1.66 m/s, and  with an assumed right atrial pressure of 10 mmHg, the estimated right ventricular systolic pressure is 26.9 mmHg. Left Atrium: Left atrial size was severely dilated. Right Atrium: Right atrial size was normal in size. Pericardium: There is no evidence of pericardial effusion. Mitral Valve: The mitral valve is degenerative in appearance. Mild to moderate mitral valve regurgitation. MV peak gradient, 9.5 mmHg. The mean mitral valve gradient is 4.0 mmHg. Tricuspid Valve: The tricuspid valve is normal in structure. Tricuspid valve regurgitation is mild. Aortic Valve: The aortic valve is normal in structure. Aortic valve regurgitation is not visualized. Aortic valve mean gradient measures 4.0 mmHg. Aortic valve peak gradient measures 8.2 mmHg. Pulmonic Valve: The pulmonic valve was normal in structure. Pulmonic valve regurgitation is not visualized. Aorta: The aortic root and ascending aorta are structurally normal, with no evidence of dilitation. IAS/Shunts: No atrial level shunt detected by color flow Doppler.   LV Volumes (MOD) LV vol d, MOD A2C: 57.3 ml Diastology LV vol d, MOD A4C: 37.5 ml LV e' lateral:   6.42 cm/s LV vol s, MOD A2C: 34.0 ml LV E/e' lateral: 25.5 LV vol s, MOD A4C: 23.2 ml LV e' medial:    5.87 cm/s LV SV MOD A2C:     23.3 ml LV E/e' medial:  27.9 LV SV MOD A4C:     37.5 ml LV SV MOD BP:      18.3 ml RIGHT VENTRICLE RV Basal diam:  2.04 cm LEFT ATRIUM             Index       RIGHT ATRIUM          Index LA Vol  (A2C):   85.0 ml 53.05 ml/m RA Area:     9.99 cm LA  Vol (A4C):   86.8 ml 54.18 ml/m RA Volume:   16.60 ml 10.36 ml/m LA Biplane Vol: 91.3 ml 56.99 ml/m  AORTIC VALVE                   PULMONIC VALVE AV Vmax:           143.00 cm/s PV Vmax:       1.70 m/s AV Vmean:          95.900 cm/s PV Vmean:      115.000 cm/s AV VTI:            0.242 m     PV VTI:        0.270 m AV Peak Grad:      8.2 mmHg    PV Peak grad:  11.6 mmHg AV Mean Grad:      4.0 mmHg    PV Mean grad:  6.0 mmHg LVOT Vmax:         125.00 cm/s LVOT Vmean:        72.000 cm/s LVOT VTI:          0.202 m LVOT/AV VTI ratio: 0.83 MITRAL VALVE                TRICUSPID VALVE MV Area (PHT): 3.18 cm     TR Peak grad:   11.0 mmHg MV Peak grad:  9.5 mmHg     TR Vmax:        166.00 cm/s MV Mean grad:  4.0 mmHg MV Vmax:       1.54 m/s     SHUNTS MV Vmean:      85.9 cm/s    Systemic VTI: 0.20 m MV Decel Time: 239 msec MV E velocity: 164.00 cm/s MV A velocity: 51.40 cm/s MV E/A ratio:  3.19 Serafina Royals MD Electronically signed by Serafina Royals MD Signature Date/Time: 07/09/2019/4:56:20 PM    Final    CT HEAD CODE STROKE WO CONTRAST`  Result Date: 07/08/2019 CLINICAL DATA:  Right-sided weakness EXAM: CT HEAD WITHOUT CONTRAST TECHNIQUE: Contiguous axial images were obtained from the base of the skull through the vertex without intravenous contrast. COMPARISON:  05/27/2018 FINDINGS: Brain: No acute intracranial hemorrhage. Possible new subcentimeter focus of hypoattenuation involving the dorsal cortex of the left precentral gyrus medial to the hand motor region. There are scattered small chronic infarcts, largest within the left parietal lobe. Several chronic cerebellar infarcts are also again noted. There may be a new small left cerebellar infarct. Additional confluent areas of hypoattenuation in the supratentorial white matter are nonspecific but probably reflect stable chronic microvascular ischemic changes. Ventricles are stable in size. There is no  extra-axial fluid collection. Vascular: No hyperdense vessel. Intracranial atherosclerotic calcification at the skull base. Skull: Unremarkable. Sinuses/Orbits: Aerated.  No acute orbital finding. Other: Mastoid air cells are clear. IMPRESSION: No acute intracranial hemorrhage. Question of small age-indeterminate cortical infarct of the left precentral gyrus and small age-indeterminate left cerebellar infarct. Chronic findings detailed above. These results were called by telephone at the time of interpretation on 07/08/2019 at 5:30 pm to provider STANLEY LUE , who verbally acknowledged these results. Electronically Signed   By: Macy Mis M.D.   On: 07/08/2019 17:30    DISCHARGE EXAMINATION: See progress note from earlier today  DISPOSITION: Residential hospice   Allergies as of 07/11/2019   No Known Allergies     Medication List    STOP taking these medications   alendronate 70 MG tablet Commonly  known as: FOSAMAX   amiodarone 200 MG tablet Commonly known as: PACERONE   benazepril 10 MG tablet Commonly known as: LOTENSIN   donepezil 10 MG tablet Commonly known as: ARICEPT   Eliquis 5 MG Tabs tablet Generic drug: apixaban   ferrous sulfate 325 (65 FE) MG tablet   levothyroxine 150 MCG tablet Commonly known as: SYNTHROID   memantine 10 MG tablet Commonly known as: NAMENDA   metoprolol succinate 25 MG 24 hr tablet Commonly known as: TOPROL-XL   senna-docusate 8.6-50 MG tablet Commonly known as: Senokot-S     TAKE these medications   glycopyrrolate 1 MG tablet Commonly known as: ROBINUL Take 1 tablet (1 mg total) by mouth every 4 (four) hours as needed (excessive secretions).   glycopyrrolate 0.2 MG/ML injection Commonly known as: ROBINUL Inject 1 mL (0.2 mg total) into the skin every 4 (four) hours as needed (excessive secretions).   glycopyrrolate 0.2 MG/ML injection Commonly known as: ROBINUL Inject 1 mL (0.2 mg total) into the vein every 4 (four) hours as  needed (excessive secretions).   haloperidol 0.5 MG tablet Commonly known as: HALDOL Take 1 tablet (0.5 mg total) by mouth every 4 (four) hours as needed for agitation (or delirium).   haloperidol 2 MG/ML solution Commonly known as: HALDOL Place 0.3 mLs (0.6 mg total) under the tongue every 4 (four) hours as needed for agitation (or delirium).   haloperidol lactate 5 MG/ML injection Commonly known as: HALDOL Inject 0.1 mLs (0.5 mg total) into the vein every 4 (four) hours as needed (or delirium).   HYDROmorphone 1 MG/ML injection Commonly known as: DILAUDID Inject 0.25-0.5 mLs (0.25-0.5 mg total) into the vein every hour as needed for moderate pain or severe pain (or dyspnea.  .25 for moderate pain or dyspnea, .5 for sevre pain or dyspnea.).   LORazepam 1 MG tablet Commonly known as: ATIVAN Take 1 tablet (1 mg total) by mouth every 4 (four) hours as needed for anxiety.   LORazepam 2 MG/ML concentrated solution Commonly known as: ATIVAN Place 0.5 mLs (1 mg total) under the tongue every 4 (four) hours as needed for anxiety.   LORazepam 2 MG/ML injection Commonly known as: ATIVAN Inject 0.25 mLs (0.5 mg total) into the vein every 4 (four) hours as needed for anxiety.   metoprolol tartrate 50 MG tablet Commonly known as: LOPRESSOR Take 1 tablet (50 mg total) by mouth 2 (two) times daily.   polyvinyl alcohol 1.4 % ophthalmic solution Commonly known as: LIQUIFILM TEARS Place 1 drop into both eyes 4 (four) times daily as needed for dry eyes.          TOTAL DISCHARGE TIME: 35 minutes  Kanai Berrios [employer] on [email or web address]  07/11/2019, 11:56 AM

## 2019-07-11 NOTE — Discharge Instructions (Signed)
Hospice Hospice is a service that is designed to provide people who are terminally ill and their families with medical, spiritual, and psychological support. Its aim is to improve your quality of life by keeping you as comfortable as possible in the final stages of life. Who will be my providers when I begin hospice care? Hospice teams often include:  A nurse.  A doctor. The hospice doctor will be available for your care, but you can include your regular doctor or nurse practitioner.  A social worker.  A counselor.  A religious leader (such as a chaplain).  A dietitian.  Therapists.  Trained volunteers who can help with care. What services does hospice provide? Hospice services can vary depending on the center or organization. Generally, they include:  Ways to keep you comfortable, such as: ? Providing care in your home or in a home-like setting. ? Working with your family and friends to help meet your needs. ? Allowing you to enjoy the support of loved ones by receiving much of your basic care from family and friends.  Pain relief and symptom management. The staff will supply all necessary medicines and equipment so that you can stay comfortable and alert enough to enjoy the company of your friends and family.  Visits or care from a nurse and doctor. This may include 24-hour on-call services.  Companionship when you are alone.  Allowing you and your family to rest. Hospice staff may do light housekeeping, prepare meals, and run errands.  Counseling. They will make sure your emotional, spiritual, and social needs are being met, as well as those needs of your family members.  Spiritual care. This will be individualized to meet your needs and your family's needs. It may involve: ? Helping you and your family understand the dying process. ? Helping you say goodbye to your family and friends. ? Performing a specific religious ceremony or ritual.  Massage.  Nutrition  therapy.  Physical and occupational therapy.  Short-term inpatient care, if something cannot be managed in the home.  Art or music therapy.  Bereavement support for grieving family members. When should hospice care begin? Most people who use hospice are believed to have less than 6 months to live.  Your family and health care providers can help you decide when hospice services should begin.  If you live longer than 6 months but your condition does not improve, your doctor may be able to approve you for continued hospice care.  If your condition improves, you may discontinue the program. What should I consider before selecting a program? Most hospice programs are run by nonprofit, independent organizations. Some are affiliated with hospitals, nursing homes, or home health care agencies. Hospice programs can take place in your home or at a hospice center, hospital, or skilled nursing facility. When choosing a hospice program, ask the following questions:  What services are available to me?  What services will be offered to my loved ones?  How involved will my loved ones be?  How involved will my health care provider be?  Who makes up the hospice care team? How are they trained or screened?  How will my pain and symptoms be managed?  If my circumstances change, can the services be provided in a different setting, such as my home or in the hospital?  Is the program reviewed and licensed by the state or certified in some other way?  What does it cost? Is it covered by insurance?  If I choose a hospice   center or nursing home, where is the hospice center located? Is it convenient for family and friends?  If I choose a hospice center or nursing home, can my family and friends visit any time?  Will you provide emotional and spiritual support?  Who can my family call with questions? Where can I learn more about hospice? You can learn about existing hospice programs in your area  from your health care providers. You can also read more about hospice online. The websites of the following organizations have helpful information:  [facility] and Palliative Care Organization [facility]): http://www.brown-buchanan.com/  National Association for [facility]): http://massey-hart.com/  Hospice Foundation of [place] ([place]): www.hospicefoundation.org  American Cancer Society (ACS): www.cancer.org  Hospice Net: www.hospicenet.org  Visiting Nurse Associations of [place] (VNAA): www.vnaa.org You may also find more information by contacting the following agencies:  A local agency on aging.  Your local [employer] chapter.  Your state's department of health or social services. Summary  Hospice is a service that is designed to provide people who are terminally ill and their families with medical, spiritual, and psychological support.  Hospice aims to improve your quality of life by keeping you as comfortable as possible in the final stages of life.  Hospice teams often include a doctor, nurse, social worker, counselor, religious leader,dietitian, therapists, and volunteers.  Hospice care generally includes medicine for symptom management, visits from doctors and nurses, physical and occupational therapy, nutrition counseling, spiritual and emotional counseling, caregiver support, and bereavement support for grieving family members.  Hospice programs can take place in your home or at a hospice center, hospital, or skilled nursing facility. This information is not intended to replace advice given to you by your health care provider. Make sure you discuss any questions you have with your health care provider. Document Revised: 09/10/2018 Document Reviewed: 01/10/2016 Elsevier Patient Education  [place].

## 2019-07-12 NOTE — TOC Progression Note (Signed)
Transition of Care [facility]) - Progression Note    Patient Details  Name: Haley Brooks MRN: [id number] Date of Birth: 30-Jun-1931  Transition of Care [facility]) CM/SW Contact  Elliot Gurney [place], [place] Phone Number: [phone] 07/12/2019, 11:01 AM  Clinical Narrative:    Return phone call from [place] liaison with [facility] to confirm that they have no beds today.      Expected Discharge Plan: [place] Barriers to Discharge: Hospice Bed not available  Expected Discharge Plan and Services Expected Discharge Plan: [place] In-house Referral: Clinical Social Work   Post Acute Care Choice: Hospice Living arrangements for the past 2 months: [place] (4 days home with daughter had been for 4 months in SNF) Expected Discharge Date: 07/11/19                                     Social Determinants of Health (SDOH) Interventions    Readmission Risk Interventions No flowsheet data found.

## 2019-07-12 NOTE — Progress Notes (Signed)
[facility] PROGRESS NOTE   JENNAVIEVE ARRICK FXT:[id number] DOB: 08/07/1931 DOA: 07/07/2019  PCP: Kirk Ruths, MD  Brief History/Interval Summary: 86 y.o. female with a past medical history of colon cancer, Alzheimer's dementia, essential hypertension, history of depression, history of stroke in February of this year, who was living in a skilled nursing facility till past week and came back home this past Thursday.  She has been cared for at home by her daughter.  Most of the history was provided by the daughter.  Patient is a very poor historian due to her dementia.  According to the daughter patient was complaining of some nausea on Thursday and Friday.  For the nausea the daughter gave her medications similar to Pepto-Bismol although she is not certain.  It was recommended by a pharmacist at [facility] [place].  Subsequently daughter noticed the patient was not having any bowel movements.  So she gave her a laxative which was followed by onset of multiple loose stools.  The stools were black in color.  Patient did not have any vomiting.  No fever or chills.  Patient denies any pain currently. Apparently she was experiencing extremely poor oral intake for past several weeks.  Apparently this has been the case even at the skilled nursing facility.  She was started on appetite stimulant while she was there.    In the emergency department stool was tested and was positive for occult blood.  It was black in color as per the ED provider.  Hemoglobin was noted to be stable however.  She was found to have acute kidney injury.  Patient's daughter also mentioned that patient had lost about 22 pounds in the last 3 to 4 months.  Patient denies any difficulty swallowing.  According to the daughter she is able to swallow her tablets and other medications without difficulties.  She just does not want to eat any food.    Consultants:  Gastroenterology.   Neurology Palliative medicine  Procedures:  None yet  Antibiotics: Anti-infectives (From admission, onward)   None      Subjective/Interval History: Patient noted to be comfortable.  She denies any pain.  Denies any complaints.  Remains confused and distracted.    Assessment/Plan:  Acute embolic stroke Patient noted to have right arm weakness on 7/7.  Neurological work-up was done.  MRI shows acute embolic stroke.  This is her second stroke in the last 3 months.  Neurology was consulted.  Echocardiogram has been done as well as below.  Normal systolic function noted.  No obvious clots are appreciated.  Carotid Doppler studies done a few months ago did not show any significant stenosis. Patient had been off of her Eliquis since she was discharged from her skilled nursing facility last week.  This is because daughter did not realize that she was supposed to be taking this medication.  Plus she presented to the hospital with concerns for GI bleed. Patient seen by gastroenterology as discussed below.  They do not plan any endoscopy at this time due to stable hemoglobins.  They are okay with resuming anticoagulation.  This was discussed with patient's daughter who is agreeable with this plan.  Eliquis was resumed.  Patient was seen by palliative medicine.  Subsequently after discussions with patient's daughter patient has been transitioned to comfort care. Stable.  Acute kidney injury with metabolic acidosis This was most likely due to poor oral intake.  Her baseline creatinine seems to be around 1.3.  She presented with a  creatinine of 2.93 and a BUN of 72.   She was started on IV fluids.  Renal function has been slowly improving.  BUN was down to 58 and creatinine 2.19.  . CT scan shows bilateral renal cysts but no evidence of obstruction.  Atrophic right kidney was noted.  Compensatory increase in size of the left kidney is noted.  Now she is comfort care.  Questionable melanotic stool Daughter mentioned black-colored stool.   Unclear as to the etiology for the same.  It looks like she is very constipated based on CT scan.  She was given laxatives and products similar to Pepto-Bismol.  CT scan also shows significantly thickened stomach wall which could be due to gastritis.  Patient started on PPI which is being continued. Gastroenterology has been following.  At this time they do not have any plans to do upper endoscopy especially considering her acute stroke.  They cleared the patient to resume anticoagulation.  Her hemoglobin has been stable the last several days.  No overt bleeding has been noted here in the hospital.  Acute blood loss anemia Hemoglobin did drop initially which could be dilutional.  No overt bleeding has been noted.  Hemoglobin has been stable.  History of anorexia and weight loss/thickened gastric wall CT scan findings as noted above.  Now patient has been transitioned to comfort care.  Hypoglycemia Likely due to n.p.o. status.  IV fluids had to be changed to D5.  Due to significant hypoglycemia she was continued on the fluids despite her acute stroke.  Comfort feeds as tolerated.  History of paroxysmal atrial fibrillation with RVR yesterday Heart rate was noted to be elevated yesterday.  Noted to be in rapid A. fib.  Improved after she was placed back on her metoprolol.  Daughter was not giving the patient Eliquis after patient was released from skilled nursing facility last week as she did not realize that she was supposed to continue with the medication.  History of colon cancer in 2005 She is status post colectomy and chemotherapy previously.  History of Alzheimer's dementia Seems to be close to her baseline.  Patient was on Aricept and Namenda at home.  Now discontinued.  Other findings on CT abdomen Fibroid uterus. Chronic compression fracture deformities of the T11, L4 and L5 vertebral bodies.  Bilateral renal cysts. This can be followed in the outpatient setting.  Goals of care Full  CODE STATUS to continue after long discussion with daughter at the time of admission.  Now patient has sustained an acute stroke with right arm weakness.  She has nonspecific findings on CT scan which may need further evaluation.  There is concern for malignancy.   Patient has had very poor oral intake with significant weight loss over the last few months.  All this portends poor prognosis.  This was all discussed with the patient's daughter.  Palliative medicine consultation was requested.  They have discussed with family.  Patient has been transitioned to comfort care.  Waiting on bed at residential hospice.   DVT Prophylaxis: SCDs Code Status: Full code Family Communication: Daughter being updated daily. Disposition Plan:  Status is: Inpatient  Remains inpatient appropriate because:Altered mental status and Inpatient level of care appropriate due to severity of illness   Dispo:  Patient From: Home  Planned Disposition: Residential hospice  Expected discharge date: 07/13/19  Medically stable for discharge: No       Medications:  Scheduled: . mouth rinse  15 mL Mouth Rinse BID  .  metoprolol tartrate  50 mg Oral BID  . pantoprazole (PROTONIX) IV  40 mg Intravenous Q12H  . sodium chloride flush  3 mL Intravenous Q12H   Continuous:  BOF:BPZWCHENIDPOE **OR** acetaminophen, antiseptic oral rinse, glycopyrrolate **OR** glycopyrrolate **OR** glycopyrrolate, haloperidol **OR** haloperidol **OR** haloperidol lactate, HYDROmorphone (DILAUDID) injection, LORazepam **OR** LORazepam **OR** LORazepam, ondansetron **OR** ondansetron (ZOFRAN) IV, polyvinyl alcohol, sodium chloride flush   Objective:  Vital Signs  Vitals:   07/10/19 1833 07/10/19 2040 07/11/19 0720 07/12/19 0839  BP: (!) 160/86 (!) 144/67 (!) 162/65 (!) 131/55  Pulse: 76 66 66 66  Resp: 20 15 17 16   Temp: 97.9 F (36.6 C) (!) 97.5 F (36.4 C) 98.1 F (36.7 C) 98.1 F (36.7 C)  TempSrc: Oral Oral    SpO2: 98% 99% 99%  98%  Weight:      Height:        Intake/Output Summary (Last 24 hours) at 07/12/2019 1056 Last data filed at 07/11/2019 2132 Gross per 24 hour  Intake 3 ml  Output --  Net 3 ml   Filed Weights   07/07/19 1150  Weight: 56.7 kg    General appearance: Awake alert.  In no distress Resp: Clear to auscultation bilaterally.  Normal effort Cardio: S1-S2 is normal regular.  No S3-S4.  No rubs murmurs or bruit GI: Abdomen is soft.  Nontender nondistended.  Bowel sounds are present normal.  No masses organomegaly Right arm weakness noted.  No improvement.   Lab Results:  Data Reviewed: I have personally reviewed following labs and imaging studies  CBC: Recent Labs  Lab 07/07/19 1201 07/07/19 2042 07/08/19 0500 07/09/19 0546 07/10/19 0608  WBC 12.1*  --  15.6* 16.0* 13.0*  HGB 11.8* 10.4* 9.8* 9.7* 9.4*  HCT 36.6 32.2* 30.3* 28.1* 28.6*  MCV 89.9  --  89.6 86.2 88.8  PLT 316  --  226 214 423    Basic Metabolic Panel: Recent Labs  Lab 07/07/19 1201 07/08/19 0500 07/09/19 0546 07/10/19 0608  NA 138 135 135 137  K 4.3 4.4 4.1 3.8  CL 105 106 108 111  CO2 17* 18* 18* 20*  GLUCOSE 97 58* 105* 120*  BUN 72* 74* 65* 58*  CREATININE 2.93* 2.87* 2.51* 2.19*  CALCIUM 9.2 8.5* 8.3* 8.5*    GFR: Estimated Creatinine Clearance: 16.2 mL/min (A) (by C-G formula based on SCr of 2.19 mg/dL (H)).  Liver Function Tests: Recent Labs  Lab 07/07/19 1201 07/08/19 0500  AST 26 23  ALT 15 13  ALKPHOS 68 57  BILITOT 0.8 0.9  PROT 7.4 6.2*  ALBUMIN 3.1* 2.5*    Coagulation Profile: Recent Labs  Lab 07/07/19 1554 07/08/19 0500  INR 1.4* 1.4*    CBG: Recent Labs  Lab 07/09/19 1124 07/09/19 1552 07/09/19 2342 07/10/19 0619 07/10/19 1441  GLUCAP 74 80 86 104* 133*     Recent Results (from the past 240 hour(s))  SARS Coronavirus 2 by RT PCR (hospital order, performed in [facility] hospital lab) Nasopharyngeal Nasopharyngeal Swab     Status: None   Collection Time:  07/07/19  4:09 PM   Specimen: Nasopharyngeal Swab  Result Value Ref Range Status   SARS Coronavirus 2 NEGATIVE NEGATIVE Final    Comment: (NOTE) SARS-CoV-2 target nucleic acids are NOT DETECTED.  The SARS-CoV-2 RNA is generally detectable in upper and lower respiratory specimens during the acute phase of infection. The lowest concentration of SARS-CoV-2 viral copies this assay can detect is 250 copies / mL. A negative  result does not preclude SARS-CoV-2 infection and should not be used as the sole basis for treatment or other patient management decisions.  A negative result may occur with improper specimen collection / handling, submission of specimen other than nasopharyngeal swab, presence of viral mutation(s) within the areas targeted by this assay, and inadequate number of viral copies (<250 copies / mL). A negative result must be combined with clinical observations, patient history, and epidemiological information.  Fact Sheet for Patients:   StrictlyIdeas.no  Fact Sheet for Healthcare Providers: BankingDealers.co.za  This test is not yet approved or  cleared by the [place] FDA and has been authorized for detection and/or diagnosis of SARS-CoV-2 by FDA under an Emergency Use Authorization (EUA).  This EUA will remain in effect (meaning this test can be used) for the duration of the COVID-19 declaration under Section 564(b)(1) of the Act, 21 U.S.C. section 360bbb-3(b)(1), unless the authorization is terminated or revoked sooner.  Performed at [facility], [address] [place], [place] [address]       Radiology Studies: No results found.     LOS: 5 days   Teylor Wolven [employer] on [email or web address]  07/12/2019, 10:56 AM

## 2019-07-13 NOTE — Progress Notes (Signed)
Patient discharged via EMS to [facility].  AVS in packet and belongings in bag and both sent with patient.

## 2019-07-13 NOTE — Discharge Summary (Signed)
[facility]  Physician Discharge Summary   Patient ID: Haley Brooks MRN: [id number] DOB/AGE: [birth year] 86 y.o.  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 Patient to be discharged to residential hospice    CODE STATUS: DNR  DISCHARGE CONDITION: fair  Diet recommendation: Comfort feeds as tolerated  INITIAL HISTORY: 86 y.o.femalewith a past medical history of colon cancer, Alzheimer's dementia, essential hypertension, history of depression, history of stroke in February of this year, who was living in a skilled nursing facility till past weekand came back homethis past Thursday. She has been cared for at home by her daughter. Most of the history was provided by the daughter. Patient is a very poor historian due to her dementia. According to the daughter patient was complaining of some nausea on Thursday and Friday. For the nausea the daughter gave her medications similar to Pepto-Bismol although she is not certain. It was recommended by apharmacist at [facility]. Subsequently daughter noticed the patient was not having any bowel movements. So she gave her a laxative which was followed by onset of multiple loose stools. The stools were black in color. Patient did not have any vomiting. No fever or chills. Patient denies any pain currently. Apparently she was experiencingextremely poor oral intake for past several weeks. Apparently this has been the case even at the skilled nursing facility. She was started on appetite stimulant  while she was there.   In the emergency department stool was tested and was positive for occult blood. It was black in coloras per the ED provider. Hemoglobin was noted to be stable however. She was found to have acute kidney injury. Patient's daughter also mentioned that patient had lost about 22 pounds in the last 3 to 4 months. Patient denies any difficulty swallowing. According to the daughter she is able to swallow her tablets and other medications without difficulties. She just does not want to eat any food.   Consultants:  Gastroenterology.   Neurology Palliative medicine  Procedures: None    HOSPITAL COURSE:   Acute embolic stroke Patient noted to have right arm weakness on 7/7.  Neurological work-up was done.  MRI shows acute embolic stroke.  This is her second stroke in the last 3 months.  Neurology was consulted.  Echocardiogram has been done as well as below.  Normal systolic function noted.  No obvious clots are appreciated.  Carotid Doppler studies done a few months ago did not show any significant stenosis. Patient had been off of her Eliquis since she was discharged from her skilled nursing facility last week.  This is because daughter did not realize that she was supposed to be taking this medication.  Plus she presented to the hospital with concerns for GI bleed. Patient seen by gastroenterology as discussed below.  They do not plan any endoscopy at this time due to stable hemoglobins.  They are okay with resuming anticoagulation.  This was discussed with patient's daughter who is agreeable with this plan.  Eliquis was resumed.  Patient was seen by palliative medicine subsequently.  After discussions with patient's daughter patient has been transitioned to comfort care.  Acute kidney injury with  metabolic acidosis This was most likely due to poor oral intake.  Her baseline creatinine seems to be around 1.3.  She presented with a creatinine of 2.93 and a BUN of  72.   She was started on IV fluids.  Renal function has been slowly improving.  BUN was down to 58 and creatinine 2.19. CT scan shows bilateral renal cysts but no evidence of obstruction.  Atrophic right kidney was noted.  Compensatory increase in size of the left kidney is noted.  Now she is comfort care.  Questionable melanotic stool Daughter mentioned black-colored stool.  Unclear as to the etiology for the same.  It looks like she is very constipated based on CT scan.  She was given laxatives and products similar to Pepto-Bismol.  CT scan also shows significantly thickened stomach wall which could be due to gastritis.  Patient started on PPI which is being continued. Gastroenterology has been following.  At this time they do not have any plans to do upper endoscopy especially considering her acute stroke.  They cleared the patient to resume anticoagulation.  Her hemoglobin has been stable the last several days.  No overt bleeding has been noted here in the hospital.  Acute blood loss anemia Hemoglobin did drop initially which could be dilutional.  No overt bleeding has been noted.  Hemoglobin has been stable.  History of anorexia and weight loss/thickened gastric wall CT scan findings as noted above.  Now patient has been transitioned to comfort care.  Hypoglycemia Likely due to n.p.o. status.  IV fluids had to be changed to D5 due to significant hypoglycemia.    History of paroxysmal atrial fibrillation with RVR yesterday Heart rate was noted to be elevated yesterday.  Noted to be in rapid A. fib.  Improved after she was placed back on her metoprolol.  Daughter was not giving the patient Eliquis after patient was released from skilled nursing facility last week as she did not realize that she was supposed to continue with the medication. Now comfort care.  History of colon cancer in 2005 She is status post colectomy and chemotherapy previously.  History of Alzheimer's  dementia Seems to be close to her baseline.  Patient was on Aricept and Namenda at home.  Now discontinued.  Other findings on CT abdomen Fibroid uterus. Chronic compression fracture deformities of the T11, L4 and L5 vertebral bodies.  Bilateral renal cysts. This can be followed in the outpatient setting.  Goals of care Full CODE STATUS to continue after long discussion with daughter at the time of admission.  Now patient has sustained an acute stroke with right arm weakness.  She has nonspecific findings on CT scan which may need further evaluation.  There is concern for malignancy.   Patient has had very poor oral intake with significant weight loss over the last few months.  All this portends poor prognosis.  This was all discussed with the patient's daughter.  Palliative medicine consultation was requested.  They have discussed with family.  Patient has been transitioned to comfort care.  To go to residential hospice.      Pressure Injury 07/10/19 Sacrum Left;Upper Stage 2 -  Partial thickness loss of dermis presenting as a shallow open injury with a red, pink wound bed without slough. (Active)  07/10/19 1544  Location: Sacrum  Location Orientation: Left;Upper  Staging: Stage 2 -  Partial thickness loss of dermis presenting as a shallow open injury with a red, pink wound bed without slough.  Wound  Description (Comments):   Present on Admission: Yes    [place] for discharge to residential hospice.   PERTINENT LABS:  The results of significant diagnostics from this hospitalization (including imaging, microbiology, ancillary and laboratory) are listed below for reference.    Microbiology: Recent Results (from the past 240 hour(s))  SARS Coronavirus 2 by RT PCR (hospital order, performed in [facility] hospital lab) Nasopharyngeal Nasopharyngeal Swab     Status: None   Collection Time: 07/07/19  4:09 PM   Specimen: Nasopharyngeal Swab  Result Value Ref Range Status   SARS  Coronavirus 2 NEGATIVE NEGATIVE Final    Comment: (NOTE) SARS-CoV-2 target nucleic acids are NOT DETECTED.  The SARS-CoV-2 RNA is generally detectable in upper and lower respiratory specimens during the acute phase of infection. The lowest concentration of SARS-CoV-2 viral copies this assay can detect is 250 copies / mL. A negative result does not preclude SARS-CoV-2 infection and should not be used as the sole basis for treatment or other patient management decisions.  A negative result may occur with improper specimen collection / handling, submission of specimen other than nasopharyngeal swab, presence of viral mutation(s) within the areas targeted by this assay, and inadequate number of viral copies (<250 copies / mL). A negative result must be combined with clinical observations, patient history, and epidemiological information.  Fact Sheet for Patients:   StrictlyIdeas.no  Fact Sheet for Healthcare Providers: BankingDealers.co.za  This test is not yet approved or  cleared by the [place] FDA and has been authorized for detection and/or diagnosis of SARS-CoV-2 by FDA under an Emergency Use Authorization (EUA).  This EUA will remain in effect (meaning this test can be used) for the duration of the COVID-19 declaration under Section 564(b)(1) of the Act, 21 U.S.C. section 360bbb-3(b)(1), unless the authorization is terminated or revoked sooner.  Performed at [facility], [address] [place], [place] [address]      Labs:    Basic Metabolic Panel: Recent Labs  Lab 07/07/19 1201 07/08/19 0500 07/09/19 0546 07/10/19 0608  NA 138 135 135 137  K 4.3 4.4 4.1 3.8  CL 105 106 108 111  CO2 17* 18* 18* 20*  GLUCOSE 97 58* 105* 120*  BUN 72* 74* 65* 58*  CREATININE 2.93* 2.87* 2.51* 2.19*  CALCIUM 9.2 8.5* 8.3* 8.5*   Liver Function Tests: Recent Labs  Lab 07/07/19 1201 07/08/19 0500  AST 26 23  ALT  15 13  ALKPHOS 68 57  BILITOT 0.8 0.9  PROT 7.4 6.2*  ALBUMIN 3.1* 2.5*   CBC: Recent Labs  Lab 07/07/19 1201 07/07/19 2042 07/08/19 0500 07/09/19 0546 07/10/19 0608  WBC 12.1*  --  15.6* 16.0* 13.0*  HGB 11.8* 10.4* 9.8* 9.7* 9.4*  HCT 36.6 32.2* 30.3* 28.1* 28.6*  MCV 89.9  --  89.6 86.2 88.8  PLT 316  --  226 214 214   CBG: Recent Labs  Lab 07/09/19 1124 07/09/19 1552 07/09/19 2342 07/10/19 0619 07/10/19 1441  GLUCAP 74 80 86 104* 133*     IMAGING STUDIES CT ABDOMEN PELVIS WO CONTRAST  Result Date: 07/07/2019 CLINICAL DATA:  Weight loss and poor appetite. EXAM: CT ABDOMEN AND PELVIS WITHOUT CONTRAST TECHNIQUE: Multidetector CT imaging of the abdomen and pelvis was performed following the standard protocol without IV contrast. COMPARISON:  March 21, 2019 FINDINGS: Lower chest: Mild atelectasis is seen within the bilateral lung bases. There is a small right pleural effusion. Hepatobiliary: No focal liver abnormality is seen. Status  post cholecystectomy. The common bile duct is dilated (0.9 cm). Pancreas: Unremarkable. No pancreatic ductal dilatation or surrounding inflammatory changes. Spleen: Normal in size without focal abnormality. Adrenals/Urinary Tract: Adrenal glands are unremarkable. The right kidney is atrophic in appearance. Mild compensatory hypertrophy of the left kidney is seen. A 2.7 cm cyst is seen within the mid right kidney. Multiple cysts are seen within the left kidney. The largest measures 4.0 cm in diameter and is located along the anterolateral aspect of the mid to upper left kidney. Bladder is unremarkable. Stomach/Bowel: There is moderate severity diffuse gastric wall thickening. The appendix is not clearly identified. No evidence of bowel dilatation. A large amount of stool is seen within the distal sigmoid colon. Noninflamed diverticula are seen throughout the large bowel. Vascular/Lymphatic: There is moderate severity calcification of the abdominal aorta.  No enlarged abdominal or pelvic lymph nodes. Reproductive: A 3.0 cm x 2.2 cm well-defined area of low attenuation is seen within the uterine fundus. Other: No abdominal wall hernia or abnormality. No abdominopelvic ascites. Musculoskeletal: Chronic compression fracture deformities are seen at the levels of T11, L4 and L5. IMPRESSION: 1. Moderate severity diffuse gastric wall thickening which may represent sequelae associated with an acute gastritis. Further evaluation is recommended as an underlying neoplastic process cannot be excluded. 2. Small right pleural effusion. 3. Status post cholecystectomy. 4. Bilateral renal cysts. 5. Findings likely consistent with a fibroid uterus. 6. Large amount of stool within the distal sigmoid colon. 7. Chronic compression fracture deformities of the T11, L4 and L5 vertebral bodies. 8. Aortic atherosclerosis. Aortic Atherosclerosis (ICD10-I70.0). Electronically Signed   By: Virgina Norfolk M.D.   On: 07/07/2019 20:14   MR BRAIN WO CONTRAST  Result Date: 07/08/2019 CLINICAL DATA:  Neurologic deficit.  Possible acute infarct. EXAM: MRI HEAD WITHOUT CONTRAST TECHNIQUE: Multiplanar, multiecho pulse sequences of the brain and surrounding structures were obtained without intravenous contrast. COMPARISON:  03/21/2019 FINDINGS: Brain: There are numerous scattered small areas of acute ischemia, greatest in both cerebellar hemispheres. There are additional foci within all of the cerebral lobes, greatest in the frontal lobes. There is no midline shift or other mass effect. No acute hemorrhage. Early confluent hyperintense T2-weighted signal of the periventricular and deep white matter, most commonly due to chronic ischemic microangiopathy. Normal midline structures. There are numerous chronic microhemorrhages in a predominantly central distribution. Vascular: Normal flow voids. Skull and upper cervical spine: Normal marrow signal. Sinuses/Orbits: Negative. Other: None. IMPRESSION: 1.  Numerous scattered small areas of acute ischemia scattered throughout the brain, likely indicating a central embolic source. 2. Numerous chronic microhemorrhages in a predominantly central distribution, consistent with chronic hypertensive angiopathy. 3. Findings of chronic ischemic microangiopathy. Electronically Signed   By: Ulyses Jarred M.D.   On: 07/08/2019 21:05   ECHOCARDIOGRAM COMPLETE  Result Date: 07/09/2019    ECHOCARDIOGRAM REPORT   Patient Name:   ARBELL WYCOFF Date of Exam: 07/09/2019 Medical Rec #:  [id number]         Height:       64.0 in Accession #:    [id number]        Weight:       125.0 lb Date of Birth:  05-20-1931        BSA:          1.602 m Patient Age:    56 years          BP:           131/60 mmHg Patient Gender: F  HR:           91 bpm. Exam Location:  [facility] Procedure: 2D Echo, Color Doppler and Cardiac Doppler Indications:     I163.9 Stroke  History:         Patient has prior history of Echocardiogram examinations, most                  recent 03/23/2019. Risk Factors:Hypertension.  Sonographer:     Charmayne Sheer RDCS (AE) Referring Phys:  [id number] Bonnielee Haff Diagnosing Phys: Serafina Royals MD  Sonographer Comments: No parasternal window. IMPRESSIONS  1. Left ventricular ejection fraction, by estimation, is 55 to 60%. The left ventricle has normal function. The left ventricle has no regional wall motion abnormalities. Left ventricular diastolic parameters were normal.  2. Right ventricular systolic function is normal. The right ventricular size is normal. There is normal pulmonary artery systolic pressure.  3. Left atrial size was severely dilated.  4. The mitral valve is degenerative. Mild to moderate mitral valve regurgitation.  5. The aortic valve is normal in structure. Aortic valve regurgitation is not visualized. FINDINGS  Left Ventricle: Left ventricular ejection fraction, by estimation, is 55 to 60%. The left ventricle has normal function. The left ventricle has no  regional wall motion abnormalities. The left ventricular internal cavity size was small. There is no left ventricular hypertrophy. Left ventricular diastolic parameters were normal. Right Ventricle: The right ventricular size is normal. No increase in right ventricular wall thickness. Right ventricular systolic function is normal. There is normal pulmonary artery systolic pressure. The tricuspid regurgitant velocity is 1.66 m/s, and  with an assumed right atrial pressure of 10 mmHg, the estimated right ventricular systolic pressure is 16.1 mmHg. Left Atrium: Left atrial size was severely dilated. Right Atrium: Right atrial size was normal in size. Pericardium: There is no evidence of pericardial effusion. Mitral Valve: The mitral valve is degenerative in appearance. Mild to moderate mitral valve regurgitation. MV peak gradient, 9.5 mmHg. The mean mitral valve gradient is 4.0 mmHg. Tricuspid Valve: The tricuspid valve is normal in structure. Tricuspid valve regurgitation is mild. Aortic Valve: The aortic valve is normal in structure. Aortic valve regurgitation is not visualized. Aortic valve mean gradient measures 4.0 mmHg. Aortic valve peak gradient measures 8.2 mmHg. Pulmonic Valve: The pulmonic valve was normal in structure. Pulmonic valve regurgitation is not visualized. Aorta: The aortic root and ascending aorta are structurally normal, with no evidence of dilitation. IAS/Shunts: No atrial level shunt detected by color flow Doppler.   LV Volumes (MOD) LV vol d, MOD A2C: 57.3 ml Diastology LV vol d, MOD A4C: 37.5 ml LV e' lateral:   6.42 cm/s LV vol s, MOD A2C: 34.0 ml LV E/e' lateral: 25.5 LV vol s, MOD A4C: 23.2 ml LV e' medial:    5.87 cm/s LV SV MOD A2C:     23.3 ml LV E/e' medial:  27.9 LV SV MOD A4C:     37.5 ml LV SV MOD BP:      18.3 ml RIGHT VENTRICLE RV Basal diam:  2.04 cm LEFT ATRIUM             Index       RIGHT ATRIUM          Index LA Vol (A2C):   85.0 ml 53.05 ml/m RA Area:     9.99 cm LA Vol  (A4C):   86.8 ml 54.18 ml/m RA Volume:   16.60 ml 10.36 ml/m LA Biplane Vol: 91.3 ml  56.99 ml/m  AORTIC VALVE                   PULMONIC VALVE AV Vmax:           143.00 cm/s PV Vmax:       1.70 m/s AV Vmean:          95.900 cm/s PV Vmean:      115.000 cm/s AV VTI:            0.242 m     PV VTI:        0.270 m AV Peak Grad:      8.2 mmHg    PV Peak grad:  11.6 mmHg AV Mean Grad:      4.0 mmHg    PV Mean grad:  6.0 mmHg LVOT Vmax:         125.00 cm/s LVOT Vmean:        72.000 cm/s LVOT VTI:          0.202 m LVOT/AV VTI ratio: 0.83 MITRAL VALVE                TRICUSPID VALVE MV Area (PHT): 3.18 cm     TR Peak grad:   11.0 mmHg MV Peak grad:  9.5 mmHg     TR Vmax:        166.00 cm/s MV Mean grad:  4.0 mmHg MV Vmax:       1.54 m/s     SHUNTS MV Vmean:      85.9 cm/s    Systemic VTI: 0.20 m MV Decel Time: 239 msec MV E velocity: 164.00 cm/s MV A velocity: 51.40 cm/s MV E/A ratio:  3.19 Serafina Royals MD Electronically signed by Serafina Royals MD Signature Date/Time: 07/09/2019/4:56:20 PM    Final    CT HEAD CODE STROKE WO CONTRAST`  Result Date: 07/08/2019 CLINICAL DATA:  Right-sided weakness EXAM: CT HEAD WITHOUT CONTRAST TECHNIQUE: Contiguous axial images were obtained from the base of the skull through the vertex without intravenous contrast. COMPARISON:  05/27/2018 FINDINGS: Brain: No acute intracranial hemorrhage. Possible new subcentimeter focus of hypoattenuation involving the dorsal cortex of the left precentral gyrus medial to the hand motor region. There are scattered small chronic infarcts, largest within the left parietal lobe. Several chronic cerebellar infarcts are also again noted. There may be a new small left cerebellar infarct. Additional confluent areas of hypoattenuation in the supratentorial white matter are nonspecific but probably reflect stable chronic microvascular ischemic changes. Ventricles are stable in size. There is no extra-axial fluid collection. Vascular: No hyperdense vessel.  Intracranial atherosclerotic calcification at the skull base. Skull: Unremarkable. Sinuses/Orbits: Aerated.  No acute orbital finding. Other: Mastoid air cells are clear. IMPRESSION: No acute intracranial hemorrhage. Question of small age-indeterminate cortical infarct of the left precentral gyrus and small age-indeterminate left cerebellar infarct. Chronic findings detailed above. These results were called by telephone at the time of interpretation on 07/08/2019 at 5:30 pm to provider STANLEY LUE , who verbally acknowledged these results. Electronically Signed   By: Macy Mis M.D.   On: 07/08/2019 17:30    DISCHARGE EXAMINATION: See PN from earlier today.  DISPOSITION: Residential hospice   Allergies as of 07/13/2019   No Known Allergies     Medication List    STOP taking these medications   alendronate 70 MG tablet Commonly known as: FOSAMAX   amiodarone 200 MG tablet Commonly known as: PACERONE   benazepril 10 MG tablet Commonly known as:  LOTENSIN   donepezil 10 MG tablet Commonly known as: ARICEPT   Eliquis 5 MG Tabs tablet Generic drug: apixaban   ferrous sulfate 325 (65 FE) MG tablet   levothyroxine 150 MCG tablet Commonly known as: SYNTHROID   memantine 10 MG tablet Commonly known as: NAMENDA   metoprolol succinate 25 MG 24 hr tablet Commonly known as: TOPROL-XL   senna-docusate 8.6-50 MG tablet Commonly known as: Senokot-S     TAKE these medications   glycopyrrolate 1 MG tablet Commonly known as: ROBINUL Take 1 tablet (1 mg total) by mouth every 4 (four) hours as needed (excessive secretions).   glycopyrrolate 0.2 MG/ML injection Commonly known as: ROBINUL Inject 1 mL (0.2 mg total) into the skin every 4 (four) hours as needed (excessive secretions).   glycopyrrolate 0.2 MG/ML injection Commonly known as: ROBINUL Inject 1 mL (0.2 mg total) into the vein every 4 (four) hours as needed (excessive secretions).   haloperidol 0.5 MG tablet Commonly  known as: HALDOL Take 1 tablet (0.5 mg total) by mouth every 4 (four) hours as needed for agitation (or delirium).   haloperidol 2 MG/ML solution Commonly known as: HALDOL Place 0.3 mLs (0.6 mg total) under the tongue every 4 (four) hours as needed for agitation (or delirium).   haloperidol lactate 5 MG/ML injection Commonly known as: HALDOL Inject 0.1 mLs (0.5 mg total) into the vein every 4 (four) hours as needed (or delirium).   HYDROmorphone 1 MG/ML injection Commonly known as: DILAUDID Inject 0.25-0.5 mLs (0.25-0.5 mg total) into the vein every hour as needed for moderate pain or severe pain (or dyspnea.  .25 for moderate pain or dyspnea, .5 for sevre pain or dyspnea.).   LORazepam 1 MG tablet Commonly known as: ATIVAN Take 1 tablet (1 mg total) by mouth every 4 (four) hours as needed for anxiety.   LORazepam 2 MG/ML concentrated solution Commonly known as: ATIVAN Place 0.5 mLs (1 mg total) under the tongue every 4 (four) hours as needed for anxiety.   LORazepam 2 MG/ML injection Commonly known as: ATIVAN Inject 0.25 mLs (0.5 mg total) into the vein every 4 (four) hours as needed for anxiety.   metoprolol tartrate 50 MG tablet Commonly known as: LOPRESSOR Take 1 tablet (50 mg total) by mouth 2 (two) times daily.   polyvinyl alcohol 1.4 % ophthalmic solution Commonly known as: LIQUIFILM TEARS Place 1 drop into both eyes 4 (four) times daily as needed for dry eyes.          TOTAL DISCHARGE TIME: 35 minutes  Almin Livingstone [employer] on [email or web address]  07/13/2019, 12:03 PM

## 2019-07-13 NOTE — Progress Notes (Signed)
[facility] PROGRESS NOTE   Haley Brooks WIO:[id number] DOB: [birth year] DOA: 07/07/2019  PCP: Kirk Ruths, MD  Brief History/Interval Summary: 86 y.o. female with a past medical history of colon cancer, Alzheimer's dementia, essential hypertension, history of depression, history of stroke in February of this year, who was living in a skilled nursing facility till past week and came back home this past Thursday.  She has been cared for at home by her daughter.  Most of the history was provided by the daughter.  Patient is a very poor historian due to her dementia.  According to the daughter patient was complaining of some nausea on Thursday and Friday.  For the nausea the daughter gave her medications similar to Pepto-Bismol although she is not certain.  It was recommended by a pharmacist at [facility].  Subsequently daughter noticed the patient was not having any bowel movements.  So she gave her a laxative which was followed by onset of multiple loose stools.  The stools were black in color.  Patient did not have any vomiting.  No fever or chills.  Patient denies any pain currently. Apparently she was experiencing extremely poor oral intake for past several weeks.  Apparently this has been the case even at the skilled nursing facility.  She was started on appetite stimulant while she was there.    In the emergency department stool was tested and was positive for occult blood.  It was black in color as per the ED provider.  Hemoglobin was noted to be stable however.  She was found to have acute kidney injury.  Patient's daughter also mentioned that patient had lost about 22 pounds in the last 3 to 4 months.  Patient denies any difficulty swallowing.  According to the daughter she is able to swallow her tablets and other medications without difficulties.  She just does not want to eat any food.    Consultants:  Gastroenterology.   Neurology Palliative medicine  Procedures:  None yet  Antibiotics: Anti-infectives (From admission, onward)   None      Subjective/Interval History: Patient noted to be comfortable.  Denies any complaints today.    Assessment/Plan:  Acute embolic stroke Patient noted to have right arm weakness on 7/7.  Neurological work-up was done.  MRI shows acute embolic stroke.  This is her second stroke in the last 3 months.  Neurology was consulted.  Echocardiogram has been done as well as below.  Normal systolic function noted.  No obvious clots are appreciated.  Carotid Doppler studies done a few months ago did not show any significant stenosis. Patient had been off of her Eliquis since she was discharged from her skilled nursing facility last week.  This is because daughter did not realize that she was supposed to be taking this medication.  Plus she presented to the hospital with concerns for GI bleed. Patient seen by gastroenterology as discussed below.  They do not plan any endoscopy at this time due to stable hemoglobins.  They are okay with resuming anticoagulation.  This was discussed with patient's daughter who is agreeable with this plan.  Eliquis was resumed.  Patient was seen by palliative medicine.  Subsequently after discussions with patient's daughter patient has been transitioned to comfort care.  Acute kidney injury with metabolic acidosis This was most likely due to poor oral intake.  Her baseline creatinine seems to be around 1.3.  She presented with a creatinine of 2.93 and a BUN of 72.  She was started on IV fluids.  Renal function has been slowly improving.  BUN was down to 58 and creatinine 2.19.  . CT scan shows bilateral renal cysts but no evidence of obstruction.  Atrophic right kidney was noted.  Compensatory increase in size of the left kidney is noted.  Now she is comfort care.  Questionable melanotic stool Daughter mentioned black-colored stool.  Unclear as to the etiology for the same.  It looks like she is  very constipated based on CT scan.  She was given laxatives and products similar to Pepto-Bismol.  CT scan also shows significantly thickened stomach wall which could be due to gastritis.  Patient started on PPI which is being continued. Gastroenterology has been following.  At this time they do not have any plans to do upper endoscopy especially considering her acute stroke.  They cleared the patient to resume anticoagulation.  Her hemoglobin has been stable the last several days.  No overt bleeding has been noted here in the hospital.  Acute blood loss anemia Hemoglobin did drop initially which could be dilutional.  No overt bleeding has been noted.  Hemoglobin has been stable.  History of anorexia and weight loss/thickened gastric wall CT scan findings as noted above.  Now patient has been transitioned to comfort care.  Hypoglycemia Likely due to n.p.o. status.  IV fluids had to be changed to D5.  Due to significant hypoglycemia she was continued on the fluids despite her acute stroke.  Comfort feeds as tolerated.  History of paroxysmal atrial fibrillation with RVR yesterday Heart rate was noted to be elevated yesterday.  Noted to be in rapid A. fib.  Improved after she was placed back on her metoprolol.  Daughter was not giving the patient Eliquis after patient was released from skilled nursing facility last week as she did not realize that she was supposed to continue with the medication.  History of colon cancer in 2005 She is status post colectomy and chemotherapy previously.  History of Alzheimer's dementia Seems to be close to her baseline.  Patient was on Aricept and Namenda at home.  Now discontinued.  Other findings on CT abdomen Fibroid uterus. Chronic compression fracture deformities of the T11, L4 and L5 vertebral bodies.  Bilateral renal cysts. This can be followed in the outpatient setting.  Goals of care Full CODE STATUS to continue after long discussion with daughter at  the time of admission.  Now patient has sustained an acute stroke with right arm weakness.  She has nonspecific findings on CT scan which may need further evaluation.  There is concern for malignancy.   Patient has had very poor oral intake with significant weight loss over the last few months.  All this portends poor prognosis.  This was all discussed with the patient's daughter.  Palliative medicine consultation was requested.  They have discussed with family.  Patient has been transitioned to comfort care.  Waiting on bed at residential hospice.   DVT Prophylaxis: SCDs Code Status: Full code Family Communication: No family at bedside. Disposition Plan:  Status is: Inpatient  Remains inpatient appropriate because:Altered mental status and Inpatient level of care appropriate due to severity of illness   Dispo:  Patient From: Home  Planned Disposition: Residential hospice  Expected discharge date: 07/13/19  Medically stable for discharge: No       Medications:  Scheduled: . mouth rinse  15 mL Mouth Rinse BID  . metoprolol tartrate  50 mg Oral BID  . pantoprazole (  PROTONIX) IV  40 mg Intravenous Q12H  . sodium chloride flush  3 mL Intravenous Q12H   Continuous:  NFA:OZHYQMVHQIONG **OR** acetaminophen, antiseptic oral rinse, glycopyrrolate **OR** glycopyrrolate **OR** glycopyrrolate, haloperidol **OR** haloperidol **OR** haloperidol lactate, HYDROmorphone (DILAUDID) injection, LORazepam **OR** LORazepam **OR** LORazepam, ondansetron **OR** ondansetron (ZOFRAN) IV, polyvinyl alcohol, sodium chloride flush   Objective:  Vital Signs  Vitals:   07/11/19 0720 07/12/19 0839 07/12/19 2129 07/12/19 2351  BP: (!) 162/65 (!) 131/55 (!) 153/65 (!) 146/82  Pulse: 66 66 67 90  Resp: 17 16  17   Temp: 98.1 F (36.7 C) 98.1 F (36.7 C)  98.1 F (36.7 C)  TempSrc:      SpO2: 99% 98%  99%  Weight:      Height:        Intake/Output Summary (Last 24 hours) at 07/13/2019 1106 Last data  filed at 07/13/2019 1013 Gross per 24 hour  Intake 123 ml  Output --  Net 123 ml   Filed Weights   07/07/19 1150  Weight: 56.7 kg    General appearance: Awake alert.  In no distress Resp: Clear to auscultation bilaterally.  Normal effort Cardio: S1-S2 is normal regular.  No S3-S4.  No rubs murmurs or bruit GI: Abdomen is soft.  Nontender nondistended.  Bowel sounds are present normal.  No masses organomegaly Continues to have right arm weakness.   Lab Results:  Data Reviewed: I have personally reviewed following labs and imaging studies  CBC: Recent Labs  Lab 07/07/19 1201 07/07/19 2042 07/08/19 0500 07/09/19 0546 07/10/19 0608  WBC 12.1*  --  15.6* 16.0* 13.0*  HGB 11.8* 10.4* 9.8* 9.7* 9.4*  HCT 36.6 32.2* 30.3* 28.1* 28.6*  MCV 89.9  --  89.6 86.2 88.8  PLT 316  --  226 214 295    Basic Metabolic Panel: Recent Labs  Lab 07/07/19 1201 07/08/19 0500 07/09/19 0546 07/10/19 0608  NA 138 135 135 137  K 4.3 4.4 4.1 3.8  CL 105 106 108 111  CO2 17* 18* 18* 20*  GLUCOSE 97 58* 105* 120*  BUN 72* 74* 65* 58*  CREATININE 2.93* 2.87* 2.51* 2.19*  CALCIUM 9.2 8.5* 8.3* 8.5*    GFR: Estimated Creatinine Clearance: 16.2 mL/min (A) (by C-G formula based on SCr of 2.19 mg/dL (H)).  Liver Function Tests: Recent Labs  Lab 07/07/19 1201 07/08/19 0500  AST 26 23  ALT 15 13  ALKPHOS 68 57  BILITOT 0.8 0.9  PROT 7.4 6.2*  ALBUMIN 3.1* 2.5*    Coagulation Profile: Recent Labs  Lab 07/07/19 1554 07/08/19 0500  INR 1.4* 1.4*    CBG: Recent Labs  Lab 07/09/19 1124 07/09/19 1552 07/09/19 2342 07/10/19 0619 07/10/19 1441  GLUCAP 74 80 86 104* 133*     Recent Results (from the past 240 hour(s))  SARS Coronavirus 2 by RT PCR (hospital order, performed in [facility] hospital lab) Nasopharyngeal Nasopharyngeal Swab     Status: None   Collection Time: 07/07/19  4:09 PM   Specimen: Nasopharyngeal Swab  Result Value Ref Range Status   SARS Coronavirus 2  NEGATIVE NEGATIVE Final    Comment: (NOTE) SARS-CoV-2 target nucleic acids are NOT DETECTED.  The SARS-CoV-2 RNA is generally detectable in upper and lower respiratory specimens during the acute phase of infection. The lowest concentration of SARS-CoV-2 viral copies this assay can detect is 250 copies / mL. A negative result does not preclude SARS-CoV-2 infection and should not be used as the sole basis  for treatment or other patient management decisions.  A negative result may occur with improper specimen collection / handling, submission of specimen other than nasopharyngeal swab, presence of viral mutation(s) within the areas targeted by this assay, and inadequate number of viral copies (<250 copies / mL). A negative result must be combined with clinical observations, patient history, and epidemiological information.  Fact Sheet for Patients:   StrictlyIdeas.no  Fact Sheet for Healthcare Providers: BankingDealers.co.za  This test is not yet approved or  cleared by the [place] FDA and has been authorized for detection and/or diagnosis of SARS-CoV-2 by FDA under an Emergency Use Authorization (EUA).  This EUA will remain in effect (meaning this test can be used) for the duration of the COVID-19 declaration under Section 564(b)(1) of the Act, 21 U.S.C. section 360bbb-3(b)(1), unless the authorization is terminated or revoked sooner.  Performed at [facility], [address], [place], [place] [address]       Radiology Studies: No results found.     LOS: 6 days   Rashan Patient [employer] on [email or web address]  07/13/2019, 11:06 AM

## 2019-07-13 NOTE — Progress Notes (Signed)
[facility]) Hospital Liaison RN note:  Visited and spoke with patient in room. She is sitting up in bed and has no complaints.  Hospice Home does have a bed to offer today. Spoke with daughter, Mateo Flow, who will going to the Hospice Home to sign consents today between 2 and [facility] care team is aware.  I will call EMS once complete and call report. Plan is for a 5pm pick up if possible. I willl fax the discharge summary.  Please call for any hospice related questions or concerns.  Thank you for the opportunity to participate in this patient's care.  Zandra Abts, RN [facility] Liaison [phone]

## 2019-07-13 NOTE — Care Management Important Message (Signed)
Important Message  Patient Details  Name: Haley Brooks MRN: [id number] Date of Birth: September 12, 1931   Medicare Important Message Given:  Other (see comment)  Patient on comfort care.  Out of respect of the patient and family no Important Message given.  Juliann Pulse A Jaque Dacy 07/13/2019, 9:21 AM

## 2019-07-13 NOTE — TOC Progression Note (Signed)
Transition of Care [facility]) - Progression Note    Patient Details  Name: Haley Brooks MRN: [id number] Date of Birth: [birth year]  Transition of Care [facility]) CM/SW Contact  Keshayla Schrum, Gardiner Rhyme, LCSW Phone Number: 07/13/2019, 8:14 AM  Clinical Narrative:  Have left message for [facility] regarding any hospice beds today. Awaiting return call     Expected Discharge Plan: [place] Barriers to Discharge: Hospice Bed not available  Expected Discharge Plan and Services Expected Discharge Plan: [place] In-house Referral: Clinical Social Work   Post Acute Care Choice: Hospice Living arrangements for the past 2 months: [place] (4 days home with daughter had been for 4 months in SNF) Expected Discharge Date: 07/11/19                                     Social Determinants of Health (SDOH) Interventions    Readmission Risk Interventions No flowsheet data found.

## 2019-07-13 NOTE — TOC Transition Note (Signed)
Transition of Care [facility]) - CM/SW Discharge Note   Patient Details  Name: Haley Brooks MRN: [id number] Date of Birth: Feb 01, 1931  [facility]) CM/SW Contact:  Elease Hashimoto, LCSW Phone Number: 07/13/2019, 12:01 PM   Clinical Narrative: Bed available at Hospice home will be transported via EMS around 5:00 pm. [facility] to inform daughter and have her complete paperwork. MD aware. DC packet in chart.    Final next level of care: [place] Barriers to Discharge: Barriers Resolved   Patient Goals and CMS Choice   CMS Medicare.gov Compare Post Acute Care list provided to:: Patient Represenative (must comment) (Valarie-daughter) Choice offered to / list presented to : [place] / [place]  Discharge Placement              Patient chooses bed at: Other - please specify in the comment section below: [facility]) Patient to be transferred to facility by: ems Name of family member notified: Valerie-daughter Patient and family notified of of transfer: 07/13/19  Discharge Plan and Services In-house Referral: Clinical Social Work   Post Acute Care Choice: Hospice                               Social Determinants of Health (SDOH) Interventions     Readmission Risk Interventions No flowsheet data found.

## 2019-08-31 MED FILL — Dextrose 5% w/ Sodium Chloride 0.45%: INTRAVENOUS | Qty: 1000 | Status: AC

## 2019-09-02 DEATH — deceased
